# Patient Record
Sex: Female | Born: 1965 | Race: Black or African American | Hispanic: No | Marital: Single | Smoking: Current every day smoker
Health system: Southern US, Community
[De-identification: ages and names within clinical notes are randomized; demographics above are authoritative.]

## PROBLEM LIST (undated history)

## (undated) ENCOUNTER — Ambulatory Visit

## (undated) DIAGNOSIS — Z720 Tobacco use: Secondary | ICD-10-CM

## (undated) DIAGNOSIS — F1991 Other psychoactive substance use, unspecified, in remission: Secondary | ICD-10-CM

## (undated) DIAGNOSIS — J45909 Unspecified asthma, uncomplicated: Secondary | ICD-10-CM

## (undated) DIAGNOSIS — F209 Schizophrenia, unspecified: Secondary | ICD-10-CM

## (undated) DIAGNOSIS — E119 Type 2 diabetes mellitus without complications: Secondary | ICD-10-CM

## (undated) DIAGNOSIS — F32A Depression, unspecified: Secondary | ICD-10-CM

## (undated) DIAGNOSIS — E785 Hyperlipidemia, unspecified: Secondary | ICD-10-CM

## (undated) DIAGNOSIS — D573 Sickle-cell trait: Secondary | ICD-10-CM

## (undated) DIAGNOSIS — F909 Attention-deficit hyperactivity disorder, unspecified type: Secondary | ICD-10-CM

## (undated) DIAGNOSIS — I2510 Atherosclerotic heart disease of native coronary artery without angina pectoris: Secondary | ICD-10-CM

## (undated) DIAGNOSIS — F319 Bipolar disorder, unspecified: Secondary | ICD-10-CM

## (undated) DIAGNOSIS — F329 Major depressive disorder, single episode, unspecified: Secondary | ICD-10-CM

## (undated) DIAGNOSIS — F419 Anxiety disorder, unspecified: Secondary | ICD-10-CM

## (undated) DIAGNOSIS — Z5189 Encounter for other specified aftercare: Secondary | ICD-10-CM

## (undated) DIAGNOSIS — K219 Gastro-esophageal reflux disease without esophagitis: Secondary | ICD-10-CM

## (undated) DIAGNOSIS — I10 Essential (primary) hypertension: Secondary | ICD-10-CM

## (undated) HISTORY — DX: Depression, unspecified: F32.A

## (undated) HISTORY — DX: Attention-deficit hyperactivity disorder, unspecified type: F90.9

## (undated) HISTORY — DX: Tobacco use: Z72.0

## (undated) HISTORY — DX: Encounter for other specified aftercare: Z51.89

## (undated) HISTORY — DX: Gastro-esophageal reflux disease without esophagitis: K21.9

## (undated) HISTORY — DX: Atherosclerotic heart disease of native coronary artery without angina pectoris: I25.10

## (undated) HISTORY — DX: Major depressive disorder, single episode, unspecified: F32.9

## (undated) HISTORY — DX: Anxiety disorder, unspecified: F41.9

## (undated) HISTORY — DX: Schizophrenia, unspecified: F20.9

## (undated) HISTORY — DX: Other psychoactive substance use, unspecified, in remission: F19.91

## (undated) HISTORY — DX: Bipolar disorder, unspecified: F31.9

## (undated) HISTORY — PX: CERVICAL BIOPSY  W/ LOOP ELECTRODE EXCISION: SUR135

---
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
## 1983-11-05 DIAGNOSIS — Z5189 Encounter for other specified aftercare: Secondary | ICD-10-CM

## 1983-11-05 HISTORY — DX: Encounter for other specified aftercare: Z51.89

## 1987-11-05 HISTORY — PX: TUBAL LIGATION: SHX77

## 1998-02-18 ENCOUNTER — Emergency Department (HOSPITAL_COMMUNITY): Admission: EM | Admit: 1998-02-18 | Discharge: 1998-02-18 | Payer: Self-pay | Admitting: Emergency Medicine

## 1999-06-21 ENCOUNTER — Emergency Department (HOSPITAL_COMMUNITY): Admission: EM | Admit: 1999-06-21 | Discharge: 1999-06-21 | Payer: Self-pay | Admitting: Emergency Medicine

## 2001-08-07 ENCOUNTER — Emergency Department (HOSPITAL_COMMUNITY): Admission: EM | Admit: 2001-08-07 | Discharge: 2001-08-07 | Payer: Self-pay | Admitting: Emergency Medicine

## 2001-10-05 ENCOUNTER — Ambulatory Visit (HOSPITAL_COMMUNITY): Admission: RE | Admit: 2001-10-05 | Discharge: 2001-10-05 | Payer: Self-pay | Admitting: Unknown Physician Specialty

## 2002-07-30 ENCOUNTER — Encounter: Admission: RE | Admit: 2002-07-30 | Discharge: 2002-07-30 | Payer: Self-pay | Admitting: Occupational Medicine

## 2002-07-30 ENCOUNTER — Encounter: Payer: Self-pay | Admitting: Occupational Medicine

## 2002-10-27 ENCOUNTER — Emergency Department (HOSPITAL_COMMUNITY): Admission: EM | Admit: 2002-10-27 | Discharge: 2002-10-27 | Payer: Self-pay | Admitting: *Deleted

## 2002-10-27 ENCOUNTER — Encounter: Payer: Self-pay | Admitting: Emergency Medicine

## 2003-09-05 ENCOUNTER — Emergency Department (HOSPITAL_COMMUNITY): Admission: EM | Admit: 2003-09-05 | Discharge: 2003-09-05 | Payer: Self-pay | Admitting: Emergency Medicine

## 2003-09-27 ENCOUNTER — Emergency Department (HOSPITAL_COMMUNITY): Admission: EM | Admit: 2003-09-27 | Discharge: 2003-09-27 | Payer: Self-pay | Admitting: Emergency Medicine

## 2003-10-06 ENCOUNTER — Encounter: Admission: RE | Admit: 2003-10-06 | Discharge: 2003-10-06 | Payer: Self-pay | Admitting: Internal Medicine

## 2004-01-10 ENCOUNTER — Encounter: Admission: RE | Admit: 2004-01-10 | Discharge: 2004-01-10 | Payer: Self-pay | Admitting: Family Medicine

## 2004-02-04 ENCOUNTER — Emergency Department (HOSPITAL_COMMUNITY): Admission: EM | Admit: 2004-02-04 | Discharge: 2004-02-04 | Payer: Self-pay | Admitting: Emergency Medicine

## 2004-02-29 ENCOUNTER — Emergency Department (HOSPITAL_COMMUNITY): Admission: EM | Admit: 2004-02-29 | Discharge: 2004-02-29 | Payer: Self-pay | Admitting: Family Medicine

## 2004-04-29 ENCOUNTER — Emergency Department (HOSPITAL_COMMUNITY): Admission: EM | Admit: 2004-04-29 | Discharge: 2004-04-29 | Payer: Self-pay | Admitting: Unknown Physician Specialty

## 2004-07-16 ENCOUNTER — Emergency Department (HOSPITAL_COMMUNITY): Admission: EM | Admit: 2004-07-16 | Discharge: 2004-07-16 | Payer: Self-pay | Admitting: Emergency Medicine

## 2004-10-10 ENCOUNTER — Ambulatory Visit (HOSPITAL_COMMUNITY): Admission: RE | Admit: 2004-10-10 | Discharge: 2004-10-10 | Payer: Self-pay | Admitting: Family Medicine

## 2005-10-09 ENCOUNTER — Encounter: Admission: RE | Admit: 2005-10-09 | Discharge: 2005-10-09 | Payer: Self-pay | Admitting: Family Medicine

## 2005-10-14 ENCOUNTER — Emergency Department (HOSPITAL_COMMUNITY): Admission: EM | Admit: 2005-10-14 | Discharge: 2005-10-14 | Payer: Self-pay | Admitting: Emergency Medicine

## 2005-10-24 ENCOUNTER — Emergency Department (HOSPITAL_COMMUNITY): Admission: EM | Admit: 2005-10-24 | Discharge: 2005-10-24 | Payer: Self-pay | Admitting: Emergency Medicine

## 2005-11-14 ENCOUNTER — Encounter: Admission: RE | Admit: 2005-11-14 | Discharge: 2005-11-27 | Payer: Self-pay | Admitting: Specialist

## 2006-03-25 ENCOUNTER — Emergency Department (HOSPITAL_COMMUNITY): Admission: EM | Admit: 2006-03-25 | Discharge: 2006-03-25 | Payer: Self-pay | Admitting: Emergency Medicine

## 2006-05-23 ENCOUNTER — Emergency Department (HOSPITAL_COMMUNITY): Admission: EM | Admit: 2006-05-23 | Discharge: 2006-05-23 | Payer: Self-pay | Admitting: Emergency Medicine

## 2006-06-20 ENCOUNTER — Ambulatory Visit: Payer: Self-pay | Admitting: Internal Medicine

## 2006-06-20 ENCOUNTER — Ambulatory Visit: Payer: Self-pay | Admitting: Family Medicine

## 2006-11-01 ENCOUNTER — Emergency Department (HOSPITAL_COMMUNITY): Admission: EM | Admit: 2006-11-01 | Discharge: 2006-11-01 | Payer: Self-pay | Admitting: Emergency Medicine

## 2006-11-06 ENCOUNTER — Ambulatory Visit: Payer: Self-pay | Admitting: *Deleted

## 2006-12-10 ENCOUNTER — Ambulatory Visit: Payer: Self-pay | Admitting: Nurse Practitioner

## 2006-12-31 ENCOUNTER — Emergency Department (HOSPITAL_COMMUNITY): Admission: EM | Admit: 2006-12-31 | Discharge: 2006-12-31 | Payer: Self-pay | Admitting: Emergency Medicine

## 2007-01-01 ENCOUNTER — Ambulatory Visit: Payer: Self-pay | Admitting: Internal Medicine

## 2007-02-16 ENCOUNTER — Ambulatory Visit: Payer: Self-pay | Admitting: Nurse Practitioner

## 2007-07-22 ENCOUNTER — Encounter (INDEPENDENT_AMBULATORY_CARE_PROVIDER_SITE_OTHER): Payer: Self-pay | Admitting: *Deleted

## 2007-09-22 ENCOUNTER — Ambulatory Visit: Payer: Self-pay | Admitting: Internal Medicine

## 2008-02-15 ENCOUNTER — Ambulatory Visit: Payer: Self-pay | Admitting: Internal Medicine

## 2008-02-17 ENCOUNTER — Ambulatory Visit: Payer: Self-pay | Admitting: Internal Medicine

## 2008-02-20 ENCOUNTER — Emergency Department (HOSPITAL_COMMUNITY): Admission: EM | Admit: 2008-02-20 | Discharge: 2008-02-20 | Payer: Self-pay | Admitting: Emergency Medicine

## 2008-05-07 ENCOUNTER — Emergency Department (HOSPITAL_COMMUNITY): Admission: EM | Admit: 2008-05-07 | Discharge: 2008-05-07 | Payer: Self-pay | Admitting: Emergency Medicine

## 2008-07-04 ENCOUNTER — Ambulatory Visit: Payer: Self-pay | Admitting: Internal Medicine

## 2008-08-02 ENCOUNTER — Emergency Department (HOSPITAL_COMMUNITY): Admission: EM | Admit: 2008-08-02 | Discharge: 2008-08-02 | Payer: Self-pay | Admitting: Family Medicine

## 2008-08-29 ENCOUNTER — Encounter (INDEPENDENT_AMBULATORY_CARE_PROVIDER_SITE_OTHER): Payer: Self-pay | Admitting: Internal Medicine

## 2008-08-29 ENCOUNTER — Ambulatory Visit: Payer: Self-pay | Admitting: Internal Medicine

## 2008-08-29 LAB — CONVERTED CEMR LAB
AST: 15 units/L (ref 0–37)
Alkaline Phosphatase: 53 units/L (ref 39–117)
BUN: 9 mg/dL (ref 6–23)
Calcium: 9.4 mg/dL (ref 8.4–10.5)
Chloride: 109 meq/L (ref 96–112)
Creatinine, Ser: 0.62 mg/dL (ref 0.40–1.20)
GC Probe Amp, Genital: NEGATIVE
HDL: 41 mg/dL (ref >39)
Total CHOL/HDL Ratio: 3.4
Valproic Acid Lvl: 93.2 ug/mL (ref 50.0–100.0)

## 2008-08-31 ENCOUNTER — Ambulatory Visit (HOSPITAL_COMMUNITY): Admission: RE | Admit: 2008-08-31 | Discharge: 2008-08-31 | Payer: Self-pay | Admitting: Family Medicine

## 2008-09-12 ENCOUNTER — Encounter: Admission: RE | Admit: 2008-09-12 | Discharge: 2008-09-12 | Payer: Self-pay | Admitting: Family Medicine

## 2008-09-21 ENCOUNTER — Ambulatory Visit: Payer: Self-pay | Admitting: Internal Medicine

## 2008-09-21 LAB — CONVERTED CEMR LAB
Basophils Absolute: 0 10*3/uL (ref 0.0–0.1)
Eosinophils Relative: 3 % (ref 0–5)
HCT: 33.1 % — ABNORMAL LOW (ref 36.0–46.0)
Hemoglobin: 11.1 g/dL — ABNORMAL LOW (ref 12.0–15.0)
Lymphocytes Relative: 40 % (ref 12–46)
Lymphs Abs: 1.9 10*3/uL (ref 0.7–4.0)
Monocytes Absolute: 0.5 10*3/uL (ref 0.1–1.0)
RDW: 14.5 % (ref 11.5–15.5)

## 2008-09-22 ENCOUNTER — Encounter (INDEPENDENT_AMBULATORY_CARE_PROVIDER_SITE_OTHER): Payer: Self-pay | Admitting: Internal Medicine

## 2008-09-23 ENCOUNTER — Ambulatory Visit: Payer: Self-pay | Admitting: Internal Medicine

## 2008-11-10 ENCOUNTER — Emergency Department (HOSPITAL_COMMUNITY): Admission: EM | Admit: 2008-11-10 | Discharge: 2008-11-10 | Payer: Self-pay | Admitting: Emergency Medicine

## 2009-04-24 ENCOUNTER — Ambulatory Visit: Payer: Self-pay | Admitting: Family Medicine

## 2009-06-02 ENCOUNTER — Ambulatory Visit: Payer: Self-pay | Admitting: Internal Medicine

## 2009-06-07 ENCOUNTER — Ambulatory Visit: Payer: Self-pay | Admitting: Internal Medicine

## 2009-06-07 ENCOUNTER — Encounter (INDEPENDENT_AMBULATORY_CARE_PROVIDER_SITE_OTHER): Payer: Self-pay | Admitting: Internal Medicine

## 2009-06-13 ENCOUNTER — Ambulatory Visit: Payer: Self-pay | Admitting: Internal Medicine

## 2009-08-23 ENCOUNTER — Other Ambulatory Visit: Admission: RE | Admit: 2009-08-23 | Discharge: 2009-08-23 | Payer: Self-pay | Admitting: Obstetrics & Gynecology

## 2009-08-23 ENCOUNTER — Ambulatory Visit: Payer: Self-pay | Admitting: Obstetrics & Gynecology

## 2009-09-13 ENCOUNTER — Encounter (INDEPENDENT_AMBULATORY_CARE_PROVIDER_SITE_OTHER): Payer: Self-pay | Admitting: Internal Medicine

## 2009-09-13 ENCOUNTER — Ambulatory Visit: Payer: Self-pay | Admitting: Internal Medicine

## 2009-09-13 LAB — CONVERTED CEMR LAB
BUN: 12 mg/dL (ref 6–23)
CO2: 22 meq/L (ref 19–32)
Calcium: 8.8 mg/dL (ref 8.4–10.5)
Chloride: 107 meq/L (ref 96–112)
Creatinine, Ser: 0.66 mg/dL (ref 0.40–1.20)
Glucose, Bld: 82 mg/dL (ref 70–99)
TSH: 1.257 microintl units/mL (ref 0.350–4.500)

## 2009-09-15 ENCOUNTER — Ambulatory Visit: Payer: Self-pay | Admitting: Internal Medicine

## 2009-10-03 ENCOUNTER — Ambulatory Visit (HOSPITAL_COMMUNITY): Admission: RE | Admit: 2009-10-03 | Discharge: 2009-10-03 | Payer: Self-pay | Admitting: Internal Medicine

## 2009-10-05 ENCOUNTER — Ambulatory Visit: Payer: Self-pay | Admitting: Internal Medicine

## 2009-10-05 ENCOUNTER — Encounter (INDEPENDENT_AMBULATORY_CARE_PROVIDER_SITE_OTHER): Payer: Self-pay | Admitting: Internal Medicine

## 2009-10-05 LAB — CONVERTED CEMR LAB
Basophils Relative: 0 % (ref 0–1)
Lymphs Abs: 2.1 10*3/uL (ref 0.7–4.0)
Monocytes Relative: 9 % (ref 3–12)
Neutro Abs: 2.5 10*3/uL (ref 1.7–7.7)
Neutrophils Relative %: 48 % (ref 43–77)
RBC: 3.81 M/uL — ABNORMAL LOW (ref 3.87–5.11)
WBC: 5.2 10*3/uL (ref 4.0–10.5)

## 2009-12-02 ENCOUNTER — Emergency Department (HOSPITAL_COMMUNITY): Admission: EM | Admit: 2009-12-02 | Discharge: 2009-12-02 | Payer: Self-pay | Admitting: Family Medicine

## 2009-12-30 ENCOUNTER — Emergency Department (HOSPITAL_COMMUNITY): Admission: EM | Admit: 2009-12-30 | Discharge: 2009-12-30 | Payer: Self-pay | Admitting: Emergency Medicine

## 2010-07-09 ENCOUNTER — Emergency Department (HOSPITAL_COMMUNITY): Admission: EM | Admit: 2010-07-09 | Discharge: 2010-07-10 | Payer: Self-pay | Admitting: Emergency Medicine

## 2010-07-18 ENCOUNTER — Emergency Department (HOSPITAL_COMMUNITY): Admission: EM | Admit: 2010-07-18 | Discharge: 2010-07-19 | Payer: Self-pay | Admitting: Emergency Medicine

## 2010-07-19 ENCOUNTER — Inpatient Hospital Stay (HOSPITAL_COMMUNITY): Admission: RE | Admit: 2010-07-19 | Discharge: 2010-07-24 | Payer: Self-pay | Admitting: Psychiatry

## 2010-07-19 ENCOUNTER — Ambulatory Visit: Payer: Self-pay | Admitting: Psychiatry

## 2010-07-20 ENCOUNTER — Emergency Department (HOSPITAL_COMMUNITY): Admission: EM | Admit: 2010-07-20 | Discharge: 2010-07-20 | Payer: Self-pay | Admitting: Emergency Medicine

## 2010-07-21 ENCOUNTER — Ambulatory Visit: Payer: Self-pay | Admitting: Surgery

## 2010-07-21 ENCOUNTER — Encounter (INDEPENDENT_AMBULATORY_CARE_PROVIDER_SITE_OTHER): Payer: Self-pay | Admitting: Psychiatry

## 2010-08-02 ENCOUNTER — Encounter: Admission: RE | Admit: 2010-08-02 | Discharge: 2010-09-14 | Payer: Self-pay | Admitting: Orthopaedic Surgery

## 2010-09-11 ENCOUNTER — Encounter: Admission: RE | Admit: 2010-09-11 | Discharge: 2010-09-11 | Payer: Self-pay | Admitting: Family Medicine

## 2010-10-30 ENCOUNTER — Emergency Department (HOSPITAL_COMMUNITY)
Admission: EM | Admit: 2010-10-30 | Discharge: 2010-10-31 | Payer: Self-pay | Source: Home / Self Care | Admitting: Emergency Medicine

## 2010-11-25 ENCOUNTER — Encounter: Payer: Self-pay | Admitting: Family Medicine

## 2011-01-14 LAB — POCT CARDIAC MARKERS
CKMB, poc: <1 ng/mL — ABNORMAL LOW (ref 1.0–8.0)
Myoglobin, poc: 27.1 ng/mL (ref 12–200)
Myoglobin, poc: 35.7 ng/mL (ref 12–200)
Troponin i, poc: <0.05 ng/mL (ref 0.00–0.09)

## 2011-01-14 LAB — POCT I-STAT, CHEM 8
Calcium, Ion: 1.16 mmol/L (ref 1.12–1.32)
Creatinine, Ser: 0.8 mg/dL (ref 0.4–1.2)
Glucose, Bld: 91 mg/dL (ref 70–99)
Hemoglobin: 11.6 g/dL — ABNORMAL LOW (ref 12.0–15.0)
Sodium: 141 mEq/L (ref 135–145)
TCO2: 25 mmol/L (ref 0–100)

## 2011-01-14 LAB — CBC
HCT: 31.9 % — ABNORMAL LOW (ref 36.0–46.0)
MCH: 28.7 pg (ref 26.0–34.0)
MCV: 83.9 fL (ref 78.0–100.0)
Platelets: 323 10*3/uL (ref 150–400)
RBC: 3.8 MIL/uL — ABNORMAL LOW (ref 3.87–5.11)

## 2011-01-14 LAB — BASIC METABOLIC PANEL
Chloride: 107 mEq/L (ref 96–112)
Creatinine, Ser: 0.71 mg/dL (ref 0.4–1.2)
GFR calc Af Amer: >60 mL/min (ref >60)
GFR calc non Af Amer: >60 mL/min (ref >60)
Potassium: 3.2 mEq/L — ABNORMAL LOW (ref 3.5–5.1)

## 2011-01-14 LAB — DIFFERENTIAL
Eosinophils Absolute: 0.1 10*3/uL (ref 0.0–0.7)
Eosinophils Relative: 2 % (ref 0–5)
Lymphs Abs: 2.2 10*3/uL (ref 0.7–4.0)
Monocytes Absolute: 0.5 10*3/uL (ref 0.1–1.0)

## 2011-01-17 LAB — POCT I-STAT, CHEM 8
BUN: 7 mg/dL (ref 6–23)
Calcium, Ion: 1.13 mmol/L (ref 1.12–1.32)
Chloride: 109 mEq/L (ref 96–112)
Creatinine, Ser: 0.7 mg/dL (ref 0.4–1.2)
Glucose, Bld: 114 mg/dL — ABNORMAL HIGH (ref 70–99)
HCT: 39 % (ref 36.0–46.0)
Hemoglobin: 13.3 g/dL (ref 12.0–15.0)
Potassium: 3 meq/L — ABNORMAL LOW (ref 3.5–5.1)
Sodium: 142 mEq/L (ref 135–145)
TCO2: 21 mmol/L (ref 0–100)

## 2011-01-17 LAB — BASIC METABOLIC PANEL
CO2: 27 mEq/L (ref 19–32)
Chloride: 111 mEq/L (ref 96–112)
GFR calc Af Amer: >60 mL/min (ref >60)
Potassium: 3.1 mEq/L — ABNORMAL LOW (ref 3.5–5.1)

## 2011-01-17 LAB — HEPATIC FUNCTION PANEL
ALT: 16 U/L (ref 0–35)
AST: 19 U/L (ref 0–37)
Total Protein: 6.8 g/dL (ref 6.0–8.3)

## 2011-01-17 LAB — CBC
Platelets: 442 10*3/uL — ABNORMAL HIGH (ref 150–400)
RBC: 3.63 MIL/uL — ABNORMAL LOW (ref 3.87–5.11)
RDW: 14.1 % (ref 11.5–15.5)
WBC: 5.6 10*3/uL (ref 4.0–10.5)

## 2011-01-17 LAB — URINALYSIS, ROUTINE W REFLEX MICROSCOPIC
Glucose, UA: NEGATIVE mg/dL
Ketones, ur: NEGATIVE mg/dL
Leukocytes, UA: NEGATIVE
Nitrite: NEGATIVE
Protein, ur: NEGATIVE mg/dL
pH: 5.5 (ref 5.0–8.0)

## 2011-01-17 LAB — RAPID URINE DRUG SCREEN, HOSP PERFORMED
Amphetamines: NOT DETECTED
Barbiturates: NOT DETECTED
Benzodiazepines: NOT DETECTED
Cocaine: POSITIVE — AB
Opiates: NOT DETECTED

## 2011-01-17 LAB — PREGNANCY, URINE: Preg Test, Ur: NEGATIVE

## 2011-01-17 LAB — URINE MICROSCOPIC-ADD ON

## 2011-01-17 LAB — VALPROIC ACID LEVEL: Valproic Acid Lvl: 62.8 ug/mL (ref 50.0–100.0)

## 2011-01-17 LAB — ETHANOL: Alcohol, Ethyl (B): 5 mg/dL (ref 0–10)

## 2011-02-07 LAB — POCT PREGNANCY, URINE: Preg Test, Ur: NEGATIVE

## 2011-02-07 LAB — GLUCOSE, CAPILLARY: Glucose-Capillary: 97 mg/dL (ref 70–99)

## 2011-07-30 LAB — RAPID STREP SCREEN (MED CTR MEBANE ONLY): Streptococcus, Group A Screen (Direct): NEGATIVE

## 2011-08-01 LAB — CBC
Hemoglobin: 10.2 — ABNORMAL LOW
RBC: 3.59 — ABNORMAL LOW

## 2011-08-01 LAB — BASIC METABOLIC PANEL
Calcium: 8 — ABNORMAL LOW
GFR calc Af Amer: >60
GFR calc non Af Amer: >60
Sodium: 139

## 2011-08-01 LAB — DIFFERENTIAL
Basophils Absolute: 0
Lymphocytes Relative: 13
Monocytes Absolute: 0.8
Monocytes Relative: 14 — ABNORMAL HIGH
Neutro Abs: 4.2
Neutrophils Relative %: 73

## 2011-08-05 LAB — WET PREP, GENITAL
WBC, Wet Prep HPF POC: NONE SEEN
Yeast Wet Prep HPF POC: NONE SEEN

## 2011-08-05 LAB — POCT URINALYSIS DIP (DEVICE)
Bilirubin Urine: NEGATIVE
Glucose, UA: NEGATIVE
Ketones, ur: NEGATIVE
Operator id: 235561

## 2011-08-05 LAB — GC/CHLAMYDIA PROBE AMP, GENITAL: Chlamydia, DNA Probe: NEGATIVE

## 2011-08-05 LAB — POCT PREGNANCY, URINE: Preg Test, Ur: NEGATIVE

## 2011-08-30 ENCOUNTER — Emergency Department (HOSPITAL_COMMUNITY): Payer: Self-pay

## 2011-08-30 ENCOUNTER — Emergency Department (HOSPITAL_COMMUNITY)
Admission: EM | Admit: 2011-08-30 | Discharge: 2011-08-30 | Disposition: A | Discharge status: Home / Self Care | Payer: Self-pay | Attending: Emergency Medicine | Admitting: Emergency Medicine

## 2011-08-30 DIAGNOSIS — F3130 Bipolar disorder, current episode depressed, mild or moderate severity, unspecified: Secondary | ICD-10-CM | POA: Insufficient documentation

## 2011-08-30 DIAGNOSIS — R112 Nausea with vomiting, unspecified: Secondary | ICD-10-CM | POA: Insufficient documentation

## 2011-08-30 DIAGNOSIS — R0602 Shortness of breath: Secondary | ICD-10-CM | POA: Insufficient documentation

## 2011-08-30 DIAGNOSIS — R197 Diarrhea, unspecified: Secondary | ICD-10-CM | POA: Insufficient documentation

## 2011-08-30 DIAGNOSIS — J40 Bronchitis, not specified as acute or chronic: Secondary | ICD-10-CM | POA: Insufficient documentation

## 2011-08-30 DIAGNOSIS — R059 Cough, unspecified: Secondary | ICD-10-CM | POA: Insufficient documentation

## 2011-08-30 DIAGNOSIS — R05 Cough: Secondary | ICD-10-CM | POA: Insufficient documentation

## 2011-08-30 DIAGNOSIS — E785 Hyperlipidemia, unspecified: Secondary | ICD-10-CM | POA: Insufficient documentation

## 2011-08-30 DIAGNOSIS — E7800 Pure hypercholesterolemia, unspecified: Secondary | ICD-10-CM | POA: Insufficient documentation

## 2011-08-30 DIAGNOSIS — Z79899 Other long term (current) drug therapy: Secondary | ICD-10-CM | POA: Insufficient documentation

## 2011-08-30 LAB — DIFFERENTIAL
Basophils Relative: 0 % (ref 0–1)
Lymphocytes Relative: 26 % (ref 12–46)
Lymphs Abs: 1.4 10*3/uL (ref 0.7–4.0)
Monocytes Absolute: 0.5 10*3/uL (ref 0.1–1.0)
Monocytes Relative: 9 % (ref 3–12)
Neutro Abs: 3.6 10*3/uL (ref 1.7–7.7)
Neutrophils Relative %: 64 % (ref 43–77)

## 2011-08-30 LAB — URINALYSIS, ROUTINE W REFLEX MICROSCOPIC
Bilirubin Urine: NEGATIVE
Glucose, UA: NEGATIVE mg/dL
Ketones, ur: 15 mg/dL — AB
Leukocytes, UA: NEGATIVE
Nitrite: NEGATIVE
Protein, ur: NEGATIVE mg/dL
Specific Gravity, Urine: 1.013 (ref 1.005–1.030)
Urobilinogen, UA: 0.2 mg/dL (ref 0.0–1.0)
pH: 5.5 (ref 5.0–8.0)

## 2011-08-30 LAB — CBC
HCT: 33.4 % — ABNORMAL LOW (ref 36.0–46.0)
Hemoglobin: 11.6 g/dL — ABNORMAL LOW (ref 12.0–15.0)
MCH: 28.9 pg (ref 26.0–34.0)
RBC: 4.01 MIL/uL (ref 3.87–5.11)

## 2011-08-30 LAB — URINE MICROSCOPIC-ADD ON

## 2011-11-11 ENCOUNTER — Other Ambulatory Visit: Payer: Self-pay | Admitting: Family Medicine

## 2011-11-11 DIAGNOSIS — Z1231 Encounter for screening mammogram for malignant neoplasm of breast: Secondary | ICD-10-CM

## 2011-11-19 ENCOUNTER — Ambulatory Visit: Payer: Self-pay

## 2012-11-18 ENCOUNTER — Emergency Department (INDEPENDENT_AMBULATORY_CARE_PROVIDER_SITE_OTHER)
Admission: EM | Admit: 2012-11-18 | Discharge: 2012-11-18 | Disposition: A | Discharge status: Home / Self Care | Payer: Self-pay | Source: Home / Self Care

## 2012-11-18 ENCOUNTER — Other Ambulatory Visit (HOSPITAL_COMMUNITY): Payer: Self-pay | Admitting: Family Medicine

## 2012-11-18 ENCOUNTER — Encounter (HOSPITAL_COMMUNITY): Payer: Self-pay

## 2012-11-18 DIAGNOSIS — H109 Unspecified conjunctivitis: Secondary | ICD-10-CM

## 2012-11-18 DIAGNOSIS — J069 Acute upper respiratory infection, unspecified: Secondary | ICD-10-CM

## 2012-11-18 DIAGNOSIS — Z1231 Encounter for screening mammogram for malignant neoplasm of breast: Secondary | ICD-10-CM

## 2012-11-18 DIAGNOSIS — L739 Follicular disorder, unspecified: Secondary | ICD-10-CM

## 2012-11-18 DIAGNOSIS — L738 Other specified follicular disorders: Secondary | ICD-10-CM

## 2012-11-18 HISTORY — DX: Unspecified asthma, uncomplicated: J45.909

## 2012-11-18 MED ORDER — POLYMYXIN B-TRIMETHOPRIM 10000-0.1 UNIT/ML-% OP SOLN: 1.0000 [drp] | OPHTHALMIC | Status: DC

## 2012-11-18 MED ORDER — PHENYLEPHRINE-CHLORPHEN-DM 10-4-12.5 MG/5ML PO LIQD: 5.0000 mL | ORAL | Status: DC | PRN

## 2012-11-18 MED ORDER — CEPHALEXIN 500 MG PO CAPS: 500.0000 mg | ORAL_CAPSULE | Freq: Three times a day (TID) | ORAL | Status: DC

## 2012-11-18 NOTE — ED Notes (Signed)
states she had the flu shot 1-3, and has been sick for past 7 days or so; no relief w OTC mediations

## 2012-11-18 NOTE — ED Provider Notes (Signed)
History     CSN: [id number]  Arrival date & time 11/18/12  1203   None     Chief Complaint  Patient presents with  . Influenza    (Consider location/radiation/quality/duration/timing/severity/associated sxs/prior treatment) HPI Comments: 47 year old female presents with 3-4 days of bodyaches, chills and upper respiratory congestion. Is also complaining of soreness in the outer canthus of the right eye. She is positive for earache on the right and complains of sore throat. Denies shortness of breath or documented fever. And additional concern is the palpation of a couple of very small superficial nodules in the axilla and an elongated tender structure in the right axilla.   Past Medical History  Diagnosis Date  . Asthma     History reviewed. No pertinent past surgical history.  History reviewed. No pertinent family history.  History  Substance Use Topics  . Smoking status: Never Smoker   . Smokeless tobacco: Not on file  . Alcohol Use: No    OB History    Grav Para Term Preterm Abortions TAB SAB Ect Mult Living                  Review of Systems  Constitutional: Negative for fever, chills, activity change, appetite change and fatigue.  HENT: Positive for congestion, rhinorrhea and postnasal drip. Negative for facial swelling, neck pain and neck stiffness.   Eyes: Positive for redness and itching. Negative for photophobia and visual disturbance.  Respiratory: Positive for cough. Negative for shortness of breath and wheezing.   Cardiovascular: Negative.  Negative for chest pain.  Gastrointestinal: Negative.   Genitourinary: Negative.   Musculoskeletal: Negative.   Skin: Negative for pallor and rash.  Neurological: Negative.     Allergies  Sulfa antibiotics  Home Medications   Current Outpatient Rx  Name  Route  Sig  Dispense  Refill  . ALBUTEROL SULFATE HFA 108 (90 BASE) MCG/ACT IN AERS   Inhalation   Inhale 2 puffs into the lungs every 6 (six) hours as  needed.         . ARIPIPRAZOLE 10 MG PO TABS   Oral   Take 10 mg by mouth daily.         Marland Kitchen RISPERIDONE 0.5 MG PO TABS   Oral   Take 0.5 mg by mouth 2 (two) times daily.         Marland Kitchen ROSUVASTATIN CALCIUM 10 MG PO TABS   Oral   Take 10 mg by mouth daily.         . TRIAMCINOLONE ACETONIDE 55 MCG/ACT NA INHA   Nasal   Place 2 sprays into the nose daily.         . CEPHALEXIN 500 MG PO CAPS   Oral   Take 1 capsule (500 mg total) by mouth 3 (three) times daily.   21 capsule   0   . PHENYLEPHRINE-CHLORPHEN-DM 08-08-11.5 MG/5ML PO LIQD   Oral   Take 5 mLs by mouth every 4 (four) hours as needed.   120 mL   0   . POLYMYXIN B-TRIMETHOPRIM 10000-0.1 UNIT/ML-% OP SOLN   Right Eye   Place 1 drop into the right eye every 4 (four) hours.   10 mL   0     BP 114/80  Pulse 87  Temp 98.4 F (36.9 C) (Oral)  Resp 20  SpO2 99%  Physical Exam  Nursing note and vitals reviewed. Constitutional: She is oriented to person, place, and time. She appears well-developed and well-nourished. No  distress.  HENT:       Bilateral TMs are retracted but no erythema. No bulging or effusion. Oropharynx with no erythema or exudates. Positive for clear PND  Eyes:       A small portion of the right lower lid at the outer canthus exhibits mild erythema. No pustule or hordeolum like structures observed. No swelling.  Neck: Normal range of motion. Neck supple.  Cardiovascular: Normal rate and regular rhythm.   Pulmonary/Chest: Effort normal and breath sounds normal. No respiratory distress. She has no wheezes. She has no rales.  Musculoskeletal: Normal range of motion. She exhibits no edema.  Lymphadenopathy:    She has no cervical adenopathy.  Neurological: She is alert and oriented to person, place, and time.  Skin: Skin is warm and dry. No rash noted.       There are 2 small, 1-2 mm size subdermal nodules in the left axilla. In the right axilla there are 2 at the same size and type of nodules  as well as a larger 2 and half centimeter by 3 mm of thickness in the subdermal structures. This may represent a continuance of the folliculitis or early abscess. It is not fluctuant nor is there overlying discoloration. It is mildly tender.  Psychiatric: She has a normal mood and affect.    ED Course  Procedures (including critical care time)  Labs Reviewed - No data to display No results found.   1. URI, acute   2. Folliculitis   3. Conjunctivitis of right eye       MDM  Keflex 500 mg 3 times a day for 7 days The: Shaving the XL a, make sure the blades are clean and wash the area well with soap and water after cleaning. He may also want to apply alcohol under the arms after shaving as well as to the razor blade to using. Polytrim 1 drop every 4 hours to the right eye for 5 days. Norell CS 1 teaspoon every 4 hours when necessary cough and cold symptoms. Is unable to afford the Norell may try Dimetapp cold medicine or NyQuil. Tylenol every 4 hours as needed for discomfort or fever Be sure to drink plenty of fluids and stay well hydrated.         Hayden Rasmussen, NP 11/18/12 1433  Hayden Rasmussen, NP 11/18/12 1435

## 2012-11-18 NOTE — ED Provider Notes (Signed)
Medical screening examination/treatment/procedure(s) were performed by non-physician practitioner and as supervising physician I was immediately available for consultation/collaboration.  Raynald Blend, MD 11/18/12 [phone]

## 2012-11-25 ENCOUNTER — Ambulatory Visit (HOSPITAL_COMMUNITY)
Admission: RE | Admit: 2012-11-25 | Discharge: 2012-11-25 | Disposition: A | Discharge status: Home / Self Care | Payer: Self-pay | Source: Ambulatory Visit | Attending: Family Medicine | Admitting: Family Medicine

## 2012-11-25 DIAGNOSIS — Z1231 Encounter for screening mammogram for malignant neoplasm of breast: Secondary | ICD-10-CM

## 2013-07-21 ENCOUNTER — Emergency Department (HOSPITAL_COMMUNITY): Payer: Self-pay

## 2013-07-21 ENCOUNTER — Emergency Department (HOSPITAL_COMMUNITY)
Admission: EM | Admit: 2013-07-21 | Discharge: 2013-07-21 | Disposition: A | Discharge status: Home / Self Care | Payer: Self-pay | Attending: Emergency Medicine | Admitting: Emergency Medicine

## 2013-07-21 ENCOUNTER — Encounter (HOSPITAL_COMMUNITY): Payer: Self-pay | Admitting: Emergency Medicine

## 2013-07-21 DIAGNOSIS — J069 Acute upper respiratory infection, unspecified: Secondary | ICD-10-CM | POA: Insufficient documentation

## 2013-07-21 DIAGNOSIS — J45909 Unspecified asthma, uncomplicated: Secondary | ICD-10-CM | POA: Insufficient documentation

## 2013-07-21 DIAGNOSIS — Z7982 Long term (current) use of aspirin: Secondary | ICD-10-CM | POA: Insufficient documentation

## 2013-07-21 DIAGNOSIS — Z79899 Other long term (current) drug therapy: Secondary | ICD-10-CM | POA: Insufficient documentation

## 2013-07-21 LAB — URINE MICROSCOPIC-ADD ON

## 2013-07-21 LAB — BASIC METABOLIC PANEL
Chloride: 104 mEq/L (ref 96–112)
GFR calc Af Amer: >90 mL/min (ref >90)
Potassium: 3.4 mEq/L — ABNORMAL LOW (ref 3.5–5.1)
Sodium: 139 mEq/L (ref 135–145)

## 2013-07-21 LAB — CBC WITH DIFFERENTIAL/PLATELET
Basophils Absolute: 0 10*3/uL (ref 0.0–0.1)
Basophils Relative: 0 % (ref 0–1)
MCHC: 33.9 g/dL (ref 30.0–36.0)
Neutro Abs: 4.1 10*3/uL (ref 1.7–7.7)
Neutrophils Relative %: 62 % (ref 43–77)
RDW: 14.5 % (ref 11.5–15.5)
WBC: 6.6 10*3/uL (ref 4.0–10.5)

## 2013-07-21 LAB — URINALYSIS, ROUTINE W REFLEX MICROSCOPIC
Ketones, ur: NEGATIVE mg/dL
Nitrite: NEGATIVE
Specific Gravity, Urine: 1.016 (ref 1.005–1.030)
pH: 6 (ref 5.0–8.0)

## 2013-07-21 MED ORDER — HYDROCODONE-ACETAMINOPHEN 7.5-325 MG/15ML PO SOLN: 15.0000 mL | Freq: Four times a day (QID) | ORAL | Status: DC | PRN

## 2013-07-21 MED ORDER — PROMETHAZINE HCL 25 MG PO TABS: 25.0000 mg | ORAL_TABLET | Freq: Four times a day (QID) | ORAL | Status: DC | PRN

## 2013-07-21 MED ORDER — HYDROCOD POLST-CHLORPHEN POLST 10-8 MG/5ML PO LQCR
5.0000 mL | Freq: Once | ORAL | Status: AC
  Administered 2013-07-21: 5 mL via ORAL
  Filled 2013-07-21: qty 5

## 2013-07-21 NOTE — ED Notes (Addendum)
Has had coughing sweats and fever and her period since jan and now she has onehas had diarrhea and vomiting stopped 2 days ago but her chest has been hurting

## 2013-07-21 NOTE — ED Provider Notes (Signed)
Medical screening examination/treatment/procedure(s) were performed by non-physician practitioner and as supervising physician I was immediately available for consultation/collaboration.   Rolan Bucco, MD 07/21/13 [phone]

## 2013-07-21 NOTE — ED Provider Notes (Signed)
CSN: [id number]     Arrival date & time 07/21/13  1051 History   First MD Initiated Contact with Patient 07/21/13 1121     Chief Complaint  Patient presents with  . URI   (Consider location/radiation/quality/duration/timing/severity/associated sxs/prior Treatment) HPI  Melissa Pham is a 47 y.o. female with past medical history significant for asthma (no hospitalizations or intubations) complaining of multiple symptoms worsening over the course of 2 weeks including chills, rhinorrhea, left-sided otalgia, increasing lymph nodes in the anterior cervical region, pleuritic chest pain, diarrhea, nonbloody, nonbilious, non coffee ground emesis which resolved 3 days ago. Patient denies fever, shortness of breath, melena or hematochezia, change in bladder habits, rash, recent travel, sick contacts. Patient has been giving herself nebulizer treatment at home with good relief. Patient is also perimenopausal, she has not had a menstrual periods since January and she is having bilateral lower abdominal cramping associated with her menstruation which started several days ago.   Past Medical History  Diagnosis Date  . Asthma    History reviewed. No pertinent past surgical history. No family history on file. History  Substance Use Topics  . Smoking status: Never Smoker   . Smokeless tobacco: Not on file  . Alcohol Use: No   OB History   Grav Para Term Preterm Abortions TAB SAB Ect Mult Living                 Review of Systems 10 systems reviewed and found to be negative, except as noted in the HPI  Allergies  Sulfa antibiotics  Home Medications   Current Outpatient Rx  Name  Route  Sig  Dispense  Refill  . albuterol (PROVENTIL HFA;VENTOLIN HFA) 108 (90 BASE) MCG/ACT inhaler   Inhalation   Inhale 2 puffs into the lungs every 6 (six) hours as needed.         . Aspirin-Acetaminophen-Caffeine (PAMPRIN MAX PO)   Oral   Take 2 tablets by mouth daily as needed (cramps).         Marland Kitchen  ibuprofen (ADVIL,MOTRIN) 200 MG tablet   Oral   Take 400 mg by mouth every 6 (six) hours as needed for pain (cold).         . mometasone (NASONEX) 50 MCG/ACT nasal spray   Nasal   Place 2 sprays into the nose daily.         . Multiple Vitamins-Minerals (MULTIVITAMIN WITH MINERALS) tablet   Oral   Take 1 tablet by mouth daily.         . Pseudoeph-Doxylamine-DM-APAP (NYQUIL PO)   Oral   Take 30 mLs by mouth daily as needed (cold).          BP 98/62  Pulse 91  Temp(Src) 98.7 F (37.1 C) (Oral)  Resp 16  Ht 5\' 1"  (1.549 m)  Wt 188 lb (85.276 kg)  BMI 35.54 kg/m2  SpO2 97% Physical Exam  Nursing note and vitals reviewed. Constitutional: She is oriented to person, place, and time. She appears well-developed and well-nourished. No distress.  HENT:  Head: Normocephalic.  Mouth/Throat: Oropharynx is clear and moist. No oropharyngeal exudate.  Bilateral tympanic membranes with normal architecture and good light reflex.  Posterior pharynx is very mildly injected  Eyes: Conjunctivae and EOM are normal. Pupils are equal, round, and reactive to light.  Neck: Neck supple.  Nontender anterior cervical lymphadenopathy  Cardiovascular: Normal rate, regular rhythm and intact distal pulses.  Exam reveals no gallop.   Pulmonary/Chest: Effort normal and breath sounds normal.  No stridor. No respiratory distress. She has no wheezes. She has no rales. She exhibits no tenderness.  Abdominal: Soft. Bowel sounds are normal. She exhibits no distension and no mass. There is no tenderness. There is no rebound and no guarding.  Genitourinary:  No CVA tenderness bilaterally  Musculoskeletal: Normal range of motion. She exhibits no edema and no tenderness.  Lymphadenopathy:    She has cervical adenopathy.  Neurological: She is alert and oriented to person, place, and time.  Skin: Skin is warm.  Psychiatric: She has a normal mood and affect.    ED Course  Procedures (including critical care  time) Labs Review Labs Reviewed  CBC WITH DIFFERENTIAL - Abnormal; Notable for the following:    Hemoglobin 11.3 (*)    HCT 33.3 (*)    All other components within normal limits  BASIC METABOLIC PANEL - Abnormal; Notable for the following:    Potassium 3.4 (*)    Glucose, Bld 101 (*)    All other components within normal limits  URINALYSIS, ROUTINE W REFLEX MICROSCOPIC - Abnormal; Notable for the following:    APPearance CLOUDY (*)    Hgb urine dipstick LARGE (*)    Protein, ur 30 (*)    Leukocytes, UA TRACE (*)    All other components within normal limits  URINE MICROSCOPIC-ADD ON - Abnormal; Notable for the following:    Squamous Epithelial / LPF MANY (*)    Bacteria, UA FEW (*)    All other components within normal limits  URINE CULTURE   Imaging Review Dg Chest 2 View  07/21/2013   CLINICAL DATA:  Cough  EXAM: CHEST  2 VIEW  COMPARISON:  08/30/2011  FINDINGS: The heart size and mediastinal contours are within normal limits. Both lungs are clear. The visualized skeletal structures are unremarkable.  IMPRESSION: No active cardiopulmonary disease.   Electronically Signed   By: Marlan Palau M.D.   On: 07/21/2013 12:44     Date: 07/21/2013  Rate: 64  Rhythm: normal sinus rhythm  QRS Axis: normal  Intervals: normal  ST/T Wave abnormalities: normal  Conduction Disutrbances:none  Narrative Interpretation:   Old EKG Reviewed: Unavailable   MDM   1. URI (upper respiratory infection)     Filed Vitals:   07/21/13 1109 07/21/13 1325 07/21/13 1355  BP: 98/62 126/86 104/72  Pulse: 91 86 74  Temp: 98.7 F (37.1 C)    TempSrc: Oral    Resp: 16 20   Height: 5\' 1"  (1.549 m)    Weight: 188 lb (85.276 kg)    SpO2: 97% 100% 100%     Melissa Pham is a 47 y.o. female with URI like symptoms over the course of the last 2 weeks. Lung sounds are clear to auscultation, patient's vitals are stable and within normal limits. Blood work shows no significant abnormalities. Urinalysis  is highly contaminated difficult to interpret, as patient has no UTI symptoms I think it is reasonable to refrain from treatment and followup culture. Chest x-ray shows no infiltrate. I have reassured the patient that her symptoms are likely viral in nature, have recommended aggressive hydration and rest. We'll give her symptomatic control with Hycet and Phenergan.  Pt is hemodynamically stable, appropriate for, and amenable to discharge at this time. Pt verbalized understanding and agrees with care plan. All questions answered. Outpatient follow-up and specific return precautions discussed.    Discharge Medication List as of 07/21/2013  1:33 PM    START taking these medications   Details  HYDROcodone-acetaminophen (HYCET) 7.5-325 mg/15 ml solution Take 15 mLs by mouth every 6 (six) hours as needed for cough., Starting 07/21/2013, Until Discontinued, Print    promethazine (PHENERGAN) 25 MG tablet Take 1 tablet (25 mg total) by mouth every 6 (six) hours as needed for nausea., Starting 07/21/2013, Until Discontinued, Print        Note: Portions of this report may have been transcribed using voice recognition software. Every effort was made to ensure accuracy; however, inadvertent computerized transcription errors may be present      Wynetta Emery, PA-C 07/21/13 1622

## 2013-07-22 LAB — URINE CULTURE: Colony Count: >=100000

## 2013-07-27 ENCOUNTER — Inpatient Hospital Stay (HOSPITAL_COMMUNITY): Admission: RE | Admit: 2013-07-27 | Payer: Self-pay | Source: Ambulatory Visit

## 2013-08-10 ENCOUNTER — Encounter (HOSPITAL_COMMUNITY): Payer: Self-pay

## 2013-08-10 ENCOUNTER — Ambulatory Visit (HOSPITAL_COMMUNITY)
Admission: RE | Admit: 2013-08-10 | Discharge: 2013-08-10 | Disposition: A | Discharge status: Home / Self Care | Payer: Self-pay | Source: Ambulatory Visit | Attending: Obstetrics and Gynecology | Admitting: Obstetrics and Gynecology

## 2013-08-10 VITALS — BP 118/78 | Temp 97.7°F | Ht 61.0 in | Wt 186.4 lb

## 2013-08-10 DIAGNOSIS — Z01419 Encounter for gynecological examination (general) (routine) without abnormal findings: Secondary | ICD-10-CM

## 2013-08-10 HISTORY — DX: Sickle-cell trait: D57.3

## 2013-08-10 NOTE — Progress Notes (Signed)
No complaints today.  Pap Smear:    Pap smear completed today. Patients last Pap smear was 09/13/2009 and normal. Per patient has a history of an abnormal Pap smear around 2009 that required a LEEP for follow up. Last Pap smear result is in EPIC.   Physical exam: Breasts Breasts symmetrical. No skin abnormalities bilateral breasts. No nipple retraction bilateral breasts. No nipple discharge bilateral breasts. No lymphadenopathy. No lumps palpated bilateral breasts. Patient complained of bilateral breast tenderness on exam.         Pelvic/Bimanual   Ext Genitalia No lesions, no swelling and no discharge observed on external genitalia.         Vagina Vagina pink and normal texture. No lesions or discharge observed in vagina.          Cervix Cervix is present. Cervix pink and of normal texture. No discharge observed.     Uterus Uterus is present and palpable. Uterus in normal position and normal size.        Adnexae Bilateral ovaries present and palpable. No tenderness on palpation.          Rectovaginal No rectal exam completed today since patient had no rectal complaints. No skin abnormalities observed on exam.

## 2013-08-10 NOTE — Patient Instructions (Signed)
Taught Melissa Pham how to perform BSE and gave educational materials to take home. Let her know [facility] will cover Pap smears every 3 years unless has a history of abnormal Pap smears. Let patient know will follow up with her within the next couple weeks with results by phone. Smoking Cessation discussed with patient. Will call patient with appointment for GYN referral for AUB. Let her know her mammogram is due January 2015.  Melissa Pham verbalized understanding.  Daton Szilagyi, Kathaleen Maser, RN 10:05 AM

## 2013-08-16 ENCOUNTER — Telehealth (HOSPITAL_COMMUNITY): Payer: Self-pay | Admitting: *Deleted

## 2013-08-16 NOTE — Telephone Encounter (Signed)
Telephoned patient at home # and left message to return call to [facility] 

## 2013-08-24 ENCOUNTER — Telehealth (HOSPITAL_COMMUNITY): Payer: Self-pay | Admitting: *Deleted

## 2013-08-24 NOTE — Telephone Encounter (Signed)
Telephoned patient at home # and left message to return call to [facility] 

## 2013-08-25 ENCOUNTER — Telehealth (HOSPITAL_COMMUNITY): Payer: Self-pay | Admitting: *Deleted

## 2013-08-25 NOTE — Telephone Encounter (Signed)
Patient returned called. Advised patient of negative pap smear. Next pap smear due in 3 years. Patient voiced understanding.

## 2013-11-08 ENCOUNTER — Other Ambulatory Visit (HOSPITAL_COMMUNITY): Payer: Self-pay | Admitting: Family Medicine

## 2013-11-08 DIAGNOSIS — Z1231 Encounter for screening mammogram for malignant neoplasm of breast: Secondary | ICD-10-CM

## 2013-11-29 ENCOUNTER — Ambulatory Visit (HOSPITAL_COMMUNITY)
Admission: RE | Admit: 2013-11-29 | Discharge: 2013-11-29 | Disposition: A | Discharge status: Home / Self Care | Payer: Self-pay | Source: Ambulatory Visit | Attending: Family Medicine | Admitting: Family Medicine

## 2013-11-29 DIAGNOSIS — Z1231 Encounter for screening mammogram for malignant neoplasm of breast: Secondary | ICD-10-CM

## 2014-07-13 ENCOUNTER — Encounter: Payer: Self-pay | Admitting: *Deleted

## 2014-08-17 ENCOUNTER — Encounter: Payer: Self-pay | Admitting: Obstetrics & Gynecology

## 2014-08-31 ENCOUNTER — Encounter: Payer: Self-pay | Admitting: Obstetrics & Gynecology

## 2014-09-05 ENCOUNTER — Encounter (HOSPITAL_COMMUNITY): Payer: Self-pay

## 2015-02-20 ENCOUNTER — Emergency Department (HOSPITAL_COMMUNITY): Payer: Medicaid Other

## 2015-02-20 ENCOUNTER — Observation Stay (HOSPITAL_COMMUNITY)
Admission: EM | Admit: 2015-02-20 | Discharge: 2015-02-21 | Disposition: A | Discharge status: Home / Self Care | Payer: Medicaid Other | Attending: Internal Medicine | Admitting: Internal Medicine

## 2015-02-20 ENCOUNTER — Encounter (HOSPITAL_COMMUNITY): Payer: Self-pay | Admitting: Emergency Medicine

## 2015-02-20 DIAGNOSIS — Z862 Personal history of diseases of the blood and blood-forming organs and certain disorders involving the immune mechanism: Secondary | ICD-10-CM | POA: Diagnosis not present

## 2015-02-20 DIAGNOSIS — R1013 Epigastric pain: Secondary | ICD-10-CM | POA: Diagnosis not present

## 2015-02-20 DIAGNOSIS — R079 Chest pain, unspecified: Secondary | ICD-10-CM | POA: Diagnosis not present

## 2015-02-20 DIAGNOSIS — R1030 Lower abdominal pain, unspecified: Secondary | ICD-10-CM

## 2015-02-20 DIAGNOSIS — R197 Diarrhea, unspecified: Secondary | ICD-10-CM | POA: Diagnosis not present

## 2015-02-20 DIAGNOSIS — Z79899 Other long term (current) drug therapy: Secondary | ICD-10-CM | POA: Insufficient documentation

## 2015-02-20 DIAGNOSIS — E785 Hyperlipidemia, unspecified: Secondary | ICD-10-CM

## 2015-02-20 DIAGNOSIS — Z7982 Long term (current) use of aspirin: Secondary | ICD-10-CM | POA: Diagnosis not present

## 2015-02-20 DIAGNOSIS — R0789 Other chest pain: Secondary | ICD-10-CM

## 2015-02-20 DIAGNOSIS — J45901 Unspecified asthma with (acute) exacerbation: Secondary | ICD-10-CM | POA: Insufficient documentation

## 2015-02-20 DIAGNOSIS — Z720 Tobacco use: Secondary | ICD-10-CM | POA: Insufficient documentation

## 2015-02-20 DIAGNOSIS — R102 Pelvic and perineal pain: Secondary | ICD-10-CM | POA: Diagnosis present

## 2015-02-20 DIAGNOSIS — E119 Type 2 diabetes mellitus without complications: Secondary | ICD-10-CM

## 2015-02-20 DIAGNOSIS — F201 Disorganized schizophrenia: Secondary | ICD-10-CM

## 2015-02-20 DIAGNOSIS — F319 Bipolar disorder, unspecified: Secondary | ICD-10-CM | POA: Diagnosis present

## 2015-02-20 DIAGNOSIS — Z7951 Long term (current) use of inhaled steroids: Secondary | ICD-10-CM | POA: Diagnosis not present

## 2015-02-20 DIAGNOSIS — I10 Essential (primary) hypertension: Secondary | ICD-10-CM | POA: Diagnosis not present

## 2015-02-20 DIAGNOSIS — F209 Schizophrenia, unspecified: Secondary | ICD-10-CM | POA: Diagnosis present

## 2015-02-20 DIAGNOSIS — E876 Hypokalemia: Secondary | ICD-10-CM | POA: Diagnosis present

## 2015-02-20 DIAGNOSIS — I152 Hypertension secondary to endocrine disorders: Secondary | ICD-10-CM

## 2015-02-20 HISTORY — DX: Essential (primary) hypertension: I10

## 2015-02-20 HISTORY — DX: Hyperlipidemia, unspecified: E78.5

## 2015-02-20 HISTORY — DX: Type 2 diabetes mellitus without complications: E11.9

## 2015-02-20 LAB — I-STAT CHEM 8, ED
BUN: 15 mg/dL (ref 6–23)
CALCIUM ION: 1.24 mmol/L — AB (ref 1.12–1.23)
CHLORIDE: 106 mmol/L (ref 96–112)
Creatinine, Ser: 0.8 mg/dL (ref 0.50–1.10)
Glucose, Bld: 109 mg/dL — ABNORMAL HIGH (ref 70–99)
HEMATOCRIT: 40 % (ref 36.0–46.0)
Hemoglobin: 13.6 g/dL (ref 12.0–15.0)
Potassium: 3.4 mmol/L — ABNORMAL LOW (ref 3.5–5.1)
Sodium: 143 mmol/L (ref 135–145)
TCO2: 23 mmol/L (ref 0–100)

## 2015-02-20 LAB — URINALYSIS, ROUTINE W REFLEX MICROSCOPIC
Bilirubin Urine: NEGATIVE
GLUCOSE, UA: NEGATIVE mg/dL
Ketones, ur: NEGATIVE mg/dL
Leukocytes, UA: NEGATIVE
Nitrite: NEGATIVE
PROTEIN: NEGATIVE mg/dL
SPECIFIC GRAVITY, URINE: 1.019 (ref 1.005–1.030)
Urobilinogen, UA: 0.2 mg/dL (ref 0.0–1.0)
pH: 5 (ref 5.0–8.0)

## 2015-02-20 LAB — CBC WITH DIFFERENTIAL/PLATELET
BASOS ABS: 0 10*3/uL (ref 0.0–0.1)
BASOS PCT: 0 % (ref 0–1)
Eosinophils Absolute: 0.1 10*3/uL (ref 0.0–0.7)
Eosinophils Relative: 2 % (ref 0–5)
HEMATOCRIT: 35.7 % — AB (ref 36.0–46.0)
HEMOGLOBIN: 11.9 g/dL — AB (ref 12.0–15.0)
LYMPHS ABS: 1.6 10*3/uL (ref 0.7–4.0)
LYMPHS PCT: 31 % (ref 12–46)
MCH: 27.9 pg (ref 26.0–34.0)
MCHC: 33.3 g/dL (ref 30.0–36.0)
MCV: 83.8 fL (ref 78.0–100.0)
Monocytes Absolute: 0.5 10*3/uL (ref 0.1–1.0)
Monocytes Relative: 9 % (ref 3–12)
Neutro Abs: 3 10*3/uL (ref 1.7–7.7)
Neutrophils Relative %: 58 % (ref 43–77)
PLATELETS: 277 10*3/uL (ref 150–400)
RBC: 4.26 MIL/uL (ref 3.87–5.11)
RDW: 15.1 % (ref 11.5–15.5)
WBC: 5.2 10*3/uL (ref 4.0–10.5)

## 2015-02-20 LAB — HEPATIC FUNCTION PANEL
ALK PHOS: 62 U/L (ref 39–117)
ALT: 20 U/L (ref 0–35)
AST: 19 U/L (ref 0–37)
Albumin: 3.6 g/dL (ref 3.5–5.2)
BILIRUBIN TOTAL: 0.4 mg/dL (ref 0.3–1.2)
Total Protein: 6.3 g/dL (ref 6.0–8.3)

## 2015-02-20 LAB — I-STAT TROPONIN, ED: TROPONIN I, POC: 0 ng/mL (ref 0.00–0.08)

## 2015-02-20 LAB — LIPASE, BLOOD: LIPASE: 22 U/L (ref 11–59)

## 2015-02-20 LAB — URINE MICROSCOPIC-ADD ON

## 2015-02-20 LAB — BASIC METABOLIC PANEL
Anion gap: 10 (ref 5–15)
BUN: 13 mg/dL (ref 6–23)
CALCIUM: 9.1 mg/dL (ref 8.4–10.5)
CO2: 24 mmol/L (ref 19–32)
CREATININE: 0.81 mg/dL (ref 0.50–1.10)
Chloride: 108 mmol/L (ref 96–112)
GFR calc Af Amer: >90 mL/min (ref >90)
GFR calc non Af Amer: 83 mL/min — ABNORMAL LOW (ref >90)
GLUCOSE: 111 mg/dL — AB (ref 70–99)
Potassium: 3.5 mmol/L (ref 3.5–5.1)
Sodium: 142 mmol/L (ref 135–145)

## 2015-02-20 LAB — GLUCOSE, CAPILLARY
GLUCOSE-CAPILLARY: 117 mg/dL — AB (ref 70–99)
Glucose-Capillary: 110 mg/dL — ABNORMAL HIGH (ref 70–99)

## 2015-02-20 LAB — TROPONIN I
Troponin I: <0.03 ng/mL (ref <0.031)
Troponin I: <0.03 ng/mL

## 2015-02-20 MED ORDER — SODIUM CHLORIDE 0.9 % IV SOLN
INTRAVENOUS | Status: DC
  Administered 2015-02-20 (×2): via INTRAVENOUS

## 2015-02-20 MED ORDER — ASPIRIN EC 325 MG PO TBEC
325.0000 mg | DELAYED_RELEASE_TABLET | Freq: Every day | ORAL | Status: DC
  Administered 2015-02-21: 325 mg via ORAL
  Filled 2015-02-20 (×2): qty 1

## 2015-02-20 MED ORDER — NITROGLYCERIN 0.4 MG SL SUBL
0.4000 mg | SUBLINGUAL_TABLET | SUBLINGUAL | Status: DC | PRN
  Filled 2015-02-20: qty 1

## 2015-02-20 MED ORDER — MORPHINE SULFATE 2 MG/ML IJ SOLN
2.0000 mg | INTRAMUSCULAR | Status: DC | PRN
  Administered 2015-02-21: 2 mg via INTRAVENOUS
  Filled 2015-02-20: qty 1

## 2015-02-20 MED ORDER — SIMVASTATIN 40 MG PO TABS
40.0000 mg | ORAL_TABLET | Freq: Every day | ORAL | Status: DC
  Administered 2015-02-21: 40 mg via ORAL
  Filled 2015-02-20 (×3): qty 1

## 2015-02-20 MED ORDER — ALBUTEROL SULFATE HFA 108 (90 BASE) MCG/ACT IN AERS: 2.0000 | INHALATION_SPRAY | Freq: Four times a day (QID) | RESPIRATORY_TRACT | Status: DC | PRN

## 2015-02-20 MED ORDER — INSULIN ASPART 100 UNIT/ML ~~LOC~~ SOLN: 0.0000 [IU] | Freq: Every day | SUBCUTANEOUS | Status: DC

## 2015-02-20 MED ORDER — PANTOPRAZOLE SODIUM 40 MG PO TBEC
40.0000 mg | DELAYED_RELEASE_TABLET | Freq: Every day | ORAL | Status: DC
  Administered 2015-02-20 – 2015-02-21 (×3): 40 mg via ORAL
  Filled 2015-02-20 (×3): qty 1

## 2015-02-20 MED ORDER — ALBUTEROL SULFATE (2.5 MG/3ML) 0.083% IN NEBU: 2.5000 mg | INHALATION_SOLUTION | Freq: Four times a day (QID) | RESPIRATORY_TRACT | Status: DC | PRN

## 2015-02-20 MED ORDER — MORPHINE SULFATE 4 MG/ML IJ SOLN
6.0000 mg | Freq: Once | INTRAMUSCULAR | Status: AC
Start: 2015-02-20 — End: 2015-02-20
  Administered 2015-02-20: 6 mg via INTRAVENOUS
  Filled 2015-02-20: qty 2

## 2015-02-20 MED ORDER — HEPARIN SODIUM (PORCINE) 5000 UNIT/ML IJ SOLN
5000.0000 [IU] | Freq: Three times a day (TID) | INTRAMUSCULAR | Status: DC
  Administered 2015-02-20 – 2015-02-21 (×3): 5000 [IU] via SUBCUTANEOUS
  Filled 2015-02-20 (×3): qty 1

## 2015-02-20 MED ORDER — OMEGA-3-ACID ETHYL ESTERS 1 G PO CAPS
1.0000 g | ORAL_CAPSULE | Freq: Every day | ORAL | Status: DC
  Administered 2015-02-20 – 2015-02-21 (×2): 1 g via ORAL
  Filled 2015-02-20 (×3): qty 1

## 2015-02-20 MED ORDER — MULTI-VITAMIN/MINERALS PO TABS
1.0000 | ORAL_TABLET | Freq: Every day | ORAL | Status: DC
  Administered 2015-02-21: 1 via ORAL
  Filled 2015-02-20 (×3): qty 1

## 2015-02-20 MED ORDER — METOPROLOL TARTRATE 25 MG PO TABS
25.0000 mg | ORAL_TABLET | Freq: Two times a day (BID) | ORAL | Status: DC
  Administered 2015-02-21: 25 mg via ORAL
  Filled 2015-02-20 (×2): qty 1

## 2015-02-20 MED ORDER — ONDANSETRON HCL 4 MG/2ML IJ SOLN
4.0000 mg | Freq: Four times a day (QID) | INTRAMUSCULAR | Status: DC | PRN
  Administered 2015-02-21: 4 mg via INTRAVENOUS
  Filled 2015-02-20: qty 2

## 2015-02-20 MED ORDER — TRAZODONE HCL 100 MG PO TABS
200.0000 mg | ORAL_TABLET | Freq: Every day | ORAL | Status: DC
  Administered 2015-02-20: 200 mg via ORAL
  Filled 2015-02-20: qty 2

## 2015-02-20 MED ORDER — INSULIN ASPART 100 UNIT/ML ~~LOC~~ SOLN: 0.0000 [IU] | Freq: Three times a day (TID) | SUBCUTANEOUS | Status: DC

## 2015-02-20 MED ORDER — DICYCLOMINE HCL 10 MG PO CAPS
10.0000 mg | ORAL_CAPSULE | Freq: Once | ORAL | Status: AC
  Administered 2015-02-20: 10 mg via ORAL
  Filled 2015-02-20: qty 1

## 2015-02-20 MED ORDER — POTASSIUM CHLORIDE CRYS ER 20 MEQ PO TBCR
40.0000 meq | EXTENDED_RELEASE_TABLET | Freq: Once | ORAL | Status: AC
Start: 2015-02-20 — End: 2015-02-20
  Administered 2015-02-20: 40 meq via ORAL
  Filled 2015-02-20: qty 2

## 2015-02-20 MED ORDER — LISINOPRIL 5 MG PO TABS
5.0000 mg | ORAL_TABLET | Freq: Every day | ORAL | Status: DC
  Administered 2015-02-20 – 2015-02-21 (×2): 5 mg via ORAL
  Filled 2015-02-20 (×2): qty 1

## 2015-02-20 MED ORDER — VALACYCLOVIR HCL 500 MG PO TABS
1000.0000 mg | ORAL_TABLET | Freq: Every day | ORAL | Status: DC
  Administered 2015-02-20 – 2015-02-21 (×2): 1000 mg via ORAL
  Filled 2015-02-20 (×3): qty 2

## 2015-02-20 MED ORDER — RISPERIDONE 2 MG PO TABS
2.0000 mg | ORAL_TABLET | Freq: Two times a day (BID) | ORAL | Status: DC
  Administered 2015-02-20 – 2015-02-21 (×2): 2 mg via ORAL
  Filled 2015-02-20 (×4): qty 1

## 2015-02-20 MED ORDER — ACETAMINOPHEN 325 MG PO TABS: 650.0000 mg | ORAL_TABLET | ORAL | Status: DC | PRN

## 2015-02-20 MED ORDER — SIMETHICONE 80 MG PO CHEW
80.0000 mg | CHEWABLE_TABLET | Freq: Once | ORAL | Status: AC
  Administered 2015-02-20: 80 mg via ORAL
  Filled 2015-02-20 (×2): qty 1

## 2015-02-20 MED ORDER — GI COCKTAIL ~~LOC~~
30.0000 mL | Freq: Four times a day (QID) | ORAL | Status: DC | PRN
  Administered 2015-02-20 – 2015-02-21 (×4): 30 mL via ORAL
  Filled 2015-02-20 (×5): qty 30

## 2015-02-20 NOTE — ED Notes (Signed)
Ordered heart healthy meal tray for pt. 

## 2015-02-20 NOTE — ED Provider Notes (Signed)
CSN: [id number]     Arrival date & time 02/20/15  1008 History   First MD Initiated Contact with Patient 02/20/15 1010     Chief Complaint  Patient presents with  . Chest Pain     (Consider location/radiation/quality/duration/timing/severity/associated sxs/prior Treatment) HPI Comments: 32 rolled female with asthma, high blood pressure, diabetes, lipids will cut this morning with severe chest pressure and diaphoresis has been fairly constant since with mild improvement. Patient has had recent issues with [facility] however this is more significant and she's never had the chest pressure with it. Patient also has a history of gas problems/bloating however this is also more significant. No guarding history known, no recent cardiac workup. Symptoms mild currently. Patient has mild urinary frequency. Nothing specifically worsened or improved her symptoms. No recent exertional symptoms. No recent surgery, no unilateral leg symptoms, no active cancer, no blood clot history.  Patient is a 49 y.o. female presenting with chest pain. The history is provided by the patient.  Chest Pain Associated symptoms: shortness of breath   Associated symptoms: no abdominal pain, no back pain, no cough, no fever, no headache and not vomiting     Past Medical History  Diagnosis Date  . Asthma   . Sickle cell trait   . Hypertension   . Diabetes mellitus without complication   . Hyperlipidemia    Past Surgical History  Procedure Laterality Date  . Cervical biopsy  w/ loop electrode excision    . Tubal ligation    . Left eye surgery     Family History  Problem Relation Age of Onset  . Hyperlipidemia Mother   . Arthritis Mother   . Heart attack Father   . Diabetes Father   . Hypertension Father   . Breast cancer Sister   . Cancer Sister     cervical  . Breast cancer Paternal Aunt    History  Substance Use Topics  . Smoking status: Current Every Day Smoker -- 0.25 packs/day for 4 years    Types:  Cigarettes  . Smokeless tobacco: Never Used  . Alcohol Use: No   OB History    Gravida Para Term Preterm AB TAB SAB Ectopic Multiple Living   7    3 2 1   4      Review of Systems  Constitutional: Negative for fever and chills.  HENT: Negative for congestion.   Eyes: Negative for visual disturbance.  Respiratory: Positive for shortness of breath. Negative for cough.   Cardiovascular: Positive for chest pain.  Gastrointestinal: Negative for vomiting and abdominal pain.  Genitourinary: Positive for frequency. Negative for dysuria and flank pain.  Musculoskeletal: Negative for back pain, neck pain and neck stiffness.  Skin: Negative for rash.  Neurological: Negative for light-headedness and headaches.      Allergies  Sulfa antibiotics  Home Medications   Prior to Admission medications   Medication Sig Start Date End Date Taking? Authorizing Provider  albuterol (PROVENTIL HFA;VENTOLIN HFA) 108 (90 BASE) MCG/ACT inhaler Inhale 2 puffs into the lungs every 6 (six) hours as needed.   Yes Historical Provider, MD  albuterol (PROVENTIL) (2.5 MG/3ML) 0.083% nebulizer solution Take 2.5 mg by nebulization every 6 (six) hours as needed for wheezing or shortness of breath.   Yes Historical Provider, MD  aspirin EC 81 MG tablet Take 81 mg by mouth daily.   Yes Historical Provider, MD  ibuprofen (ADVIL,MOTRIN) 200 MG tablet Take 400 mg by mouth every 6 (six) hours as needed for  pain (cold).   Yes Historical Provider, MD  lisinopril (PRINIVIL,ZESTRIL) 5 MG tablet Take 5 mg by mouth daily.   Yes Historical Provider, MD  metFORMIN (GLUCOPHAGE) 500 MG tablet Take 250 mg by mouth at bedtime and may repeat dose one time if needed.   Yes Historical Provider, MD  mometasone (NASONEX) 50 MCG/ACT nasal spray Place 2 sprays into the nose daily.   Yes Historical Provider, MD  Multiple Vitamins-Minerals (MULTIVITAMIN WITH MINERALS) tablet Take 1 tablet by mouth daily.   Yes Historical Provider, MD   nitroGLYCERIN (NITROSTAT) 0.4 MG SL tablet Place 0.4 mg under the tongue every 5 (five) minutes as needed for chest pain.   Yes Historical Provider, MD  Omega-3 Fatty Acids (FISH OIL PO) Take 1 tablet by mouth daily.   Yes Historical Provider, MD  omeprazole (PRILOSEC) 20 MG capsule Take 20 mg by mouth daily.   Yes Historical Provider, MD  risperiDONE (RISPERDAL) 2 MG tablet Take 2 mg by mouth 2 (two) times daily.   Yes Historical Provider, MD  simethicone (MYLICON) 80 MG chewable tablet Chew 80 mg by mouth once.   Yes Historical Provider, MD  simvastatin (ZOCOR) 40 MG tablet Take 40 mg by mouth daily.   Yes Historical Provider, MD  traZODone (DESYREL) 100 MG tablet Take 200 mg by mouth at bedtime.   Yes Historical Provider, MD  valACYclovir (VALTREX) 500 MG tablet Take 1,000 mg by mouth daily.    Yes Historical Provider, MD  HYDROcodone-acetaminophen (HYCET) 7.5-325 mg/15 ml solution Take 15 mLs by mouth every 6 (six) hours as needed for cough. Patient not taking: Reported on 02/20/2015 07/21/13   Elmyra Ricks Pisciotta, PA-C  promethazine (PHENERGAN) 25 MG tablet Take 1 tablet (25 mg total) by mouth every 6 (six) hours as needed for nausea. Patient not taking: Reported on 02/20/2015 07/21/13   Elmyra Ricks Pisciotta, PA-C   BP 119/14 mmHg  Pulse 74  Temp(Src) 97.9 F (36.6 C) (Oral)  Resp 19  Ht 5\' 1"  (1.549 m)  Wt 193 lb 14.4 oz (87.952 kg)  BMI 36.66 kg/m2  SpO2 98% Physical Exam  Constitutional: She is oriented to person, place, and time. She appears well-developed and well-nourished.  HENT:  Head: Normocephalic and atraumatic.  Eyes: Conjunctivae are normal. Right eye exhibits no discharge. Left eye exhibits no discharge.  Neck: Normal range of motion. Neck supple. No tracheal deviation present.  Cardiovascular: Normal rate, regular rhythm and intact distal pulses.   Pulmonary/Chest: Effort normal and breath sounds normal.  Abdominal: Soft. She exhibits no distension. There is tenderness (mild  epi, no hx of gb issues). There is no guarding.  Musculoskeletal: She exhibits no edema or tenderness.  Neurological: She is alert and oriented to person, place, and time.  Skin: Skin is warm. No rash noted.  Psychiatric: She has a normal mood and affect.  Nursing note and vitals reviewed.   ED Course  Procedures (including critical care time) Labs Review Labs Reviewed  BASIC METABOLIC PANEL - Abnormal; Notable for the following:    Glucose, Bld 111 (*)    GFR calc non Af Amer 83 (*)    All other components within normal limits  CBC WITH DIFFERENTIAL/PLATELET - Abnormal; Notable for the following:    Hemoglobin 11.9 (*)    HCT 35.7 (*)    All other components within normal limits  I-STAT CHEM 8, ED - Abnormal; Notable for the following:    Potassium 3.4 (*)    Glucose, Bld 109 (*)  Calcium, Ion 1.24 (*)    All other components within normal limits  TROPONIN I  LIPASE, BLOOD  HEPATIC FUNCTION PANEL  URINALYSIS, ROUTINE W REFLEX MICROSCOPIC  I-STAT TROPOININ, ED    Imaging Review Dg Chest 2 View  02/20/2015   CLINICAL DATA:  Chest tightness.  EXAM: CHEST  2 VIEW  COMPARISON:  None.  FINDINGS: Mediastinum hilar structures normal. Lungs are clear. Heart size normal. No pleural effusion or pneumothorax. No acute bony abnormality.  IMPRESSION: No acute cardiopulmonary disease.   Electronically Signed   By: Marcello Moores  Register   On: 02/20/2015 10:39     EKG Interpretation   Date/Time:  Monday February 20 2015 10:17:25 EDT Ventricular Rate:  60 PR Interval:  147 QRS Duration: 60 QT Interval:  445 QTC Calculation: 445 R Axis:   72 Text Interpretation:  Sinus rhythm Low voltage, precordial leads  Nonspecific T abnormalities, anterior leads Baseline wander in lead(s) II  III aVF Confirmed by Xee Hollman  MD, Arren Laminack ([id number]) on 02/20/2015 10:24:14 AM      MDM   Final diagnoses:  Chest pain   Patient with multiple cardiac risk factors presents with concerning story for possible  cardiac. Patient does have other symptoms however from the emergency standpoint she needs further workup of cardiac etiology. No classic blood clot risk factors. Mild urinary symptoms, urinalysis pending. Discussed with [facility] hospitalist will see the patient in ER. Very mild T-wave changes compared to last EKG reviewed. The patients results and plan were reviewed and discussed.   Any x-rays performed were personally reviewed by myself.   Differential diagnosis were considered with the presenting HPI.  Medications  morphine 4 MG/ML injection 6 mg (not administered)  dicyclomine (BENTYL) capsule 10 mg (not administered)    Filed Vitals:   02/20/15 1017 02/20/15 1020 02/20/15 1026 02/20/15 1130  BP:  140/66  119/14  Pulse:    74  Temp: 97.9 F (36.6 C)     TempSrc: Oral     Resp: 23   19  Height:      Weight:   193 lb 14.4 oz (87.952 kg)   SpO2: 100%   98%    Final diagnoses:  Chest pain    Admission/ observation were discussed with the admitting physician, patient and/or family and they are comfortable with the plan.     Elnora Morrison, MD 02/20/15 [phone]

## 2015-02-20 NOTE — ED Notes (Signed)
Taking pt up  To 3W for bedside report

## 2015-02-20 NOTE — ED Notes (Signed)
Pt woke up at 0300 with chest tightness. 9/10 tightness/pain. Pt went to MD office for the tightness, MD sent to ED. Pt has hx of asthma, some SOB. Pt had 2 nitro, pain now 3/10. Pt received 4mg  zofran for nausea. 324 mg ASA also given. Pt thinks she just has gas. Pt also took gas ex. BP 114/72, EKG SR

## 2015-02-20 NOTE — H&P (Signed)
[facility] History and Physical                                                                                    Melissa Pham, is a 49 y.o. female  MRN: [id number]   DOB - [birth year]  Admit Date - 02/20/2015  Outpatient Primary MD for the patient is [place]  With History of -  Past Medical History  Diagnosis Date  . Asthma   . Sickle cell trait   . Hypertension   . Diabetes mellitus without complication   . Hyperlipidemia       Past Surgical History  Procedure Laterality Date  . Cervical biopsy  w/ loop electrode excision    . Tubal ligation    . Left eye surgery      in for   Chief Complaint  Patient presents with  . Chest Pain     HPI 49 year old female patient of history of asthma, hypertension, diabetes, dyslipidemia, sickle cell trait, bipolar disorder and schizophrenia. Presented to the ER with complaints of nocturnal chest pain. Patient reports that she was awakened around 3 AM today when she developed midsternal chest discomfort that was tight and felt like an elephant sitting on her chest. This pain radiated into the axilla down into her left arm causing some numbness. Patient reports that she does have known peripheral neuropathy and involving her arms but this was different. Patient reports that she passed flatus without any improvement in her symptoms. She was noting nausea and diaphoresis associated with this discomfort. She became anxious and later became short of breath. She has not noticed similar symptoms with exertion although according to her own report she does not typically exert herself noting she does not climb stairs. She does report that she plays around with her grandchildren in the kitchen running a little bit has not experienced similar pain. She is also reporting about 4 days worth of diarrhea. She reports about 3 weeks ago she was given antibiotics for enlarged cervical lymph nodes and presumed  respiratory infection. She completed the antibiotics one and half weeks ago. Upper wrist for symptoms have resolved. She is now reporting diarrhea with multiple frequent watery stools for the past 4 days. She is also complaining of lower abdominal cramping associated with tissue area and urinary frequency as well as pain radiating into the lower back.  In regards to her chest pain patient reports she took Gas-X at home as well as Aleve and Imodium without any improvement in her symptoms. She called EMS she was given aspirin and 2 nitroglycerin with subsequent decrease in pain. She reports her initial chest pain was 9/10 and decreased to 2/10 upon arrival to the ER. Patient continues to have nagging discomfort. ER her EKG was normal out any ischemic changes. Troponin was normal. Two-view chest x-ray was unremarkable.  Review of Systems   In addition to the HPI above,  No Fever-chills, myalgias or other constitutional symptoms No Headache, changes with Vision or hearing, new weakness, tingling, numbness in any extremity, No problems swallowing food or Liquids, indigestion/reflux No Cough, palpitations, orthopnea or DOE No melena or hematochezia,  no dark tarry stools No new skin rashes, lesions, masses or bruises, No new joints pains-aches No recent weight gain or loss No polyuria, polydypsia or polyphagia,  *A full 10 point Review of Systems was done, except as stated above, all other Review of Systems were negative.  Social History History  Substance Use Topics  . Smoking status: Current Every Day Smoker -- 0.25 packs/day for 4 years    Types: Cigarettes  . Smokeless tobacco: Never Used  . Alcohol Use: No    Family History Family History  Problem Relation Age of Onset  . Hyperlipidemia Mother   . Arthritis Mother   . Heart attack Father   . Diabetes Father   . Hypertension Father   . Breast cancer Sister   . Cancer Sister     cervical  . Breast cancer Paternal Aunt     Prior  to Admission medications   Medication Sig Start Date End Date Taking? Authorizing Provider  albuterol (PROVENTIL HFA;VENTOLIN HFA) 108 (90 BASE) MCG/ACT inhaler Inhale 2 puffs into the lungs every 6 (six) hours as needed.   Yes Historical Provider, MD  albuterol (PROVENTIL) (2.5 MG/3ML) 0.083% nebulizer solution Take 2.5 mg by nebulization every 6 (six) hours as needed for wheezing or shortness of breath.   Yes Historical Provider, MD  aspirin EC 81 MG tablet Take 81 mg by mouth daily.   Yes Historical Provider, MD  ibuprofen (ADVIL,MOTRIN) 200 MG tablet Take 400 mg by mouth every 6 (six) hours as needed for pain (cold).   Yes Historical Provider, MD  lisinopril (PRINIVIL,ZESTRIL) 5 MG tablet Take 5 mg by mouth daily.   Yes Historical Provider, MD  metFORMIN (GLUCOPHAGE) 500 MG tablet Take 250 mg by mouth at bedtime and may repeat dose one time if needed.   Yes Historical Provider, MD  mometasone (NASONEX) 50 MCG/ACT nasal spray Place 2 sprays into the nose daily.   Yes Historical Provider, MD  Multiple Vitamins-Minerals (MULTIVITAMIN WITH MINERALS) tablet Take 1 tablet by mouth daily.   Yes Historical Provider, MD  nitroGLYCERIN (NITROSTAT) 0.4 MG SL tablet Place 0.4 mg under the tongue every 5 (five) minutes as needed for chest pain.   Yes Historical Provider, MD  Omega-3 Fatty Acids (FISH OIL PO) Take 1 tablet by mouth daily.   Yes Historical Provider, MD  omeprazole (PRILOSEC) 20 MG capsule Take 20 mg by mouth daily.   Yes Historical Provider, MD  risperiDONE (RISPERDAL) 2 MG tablet Take 2 mg by mouth 2 (two) times daily.   Yes Historical Provider, MD  simethicone (MYLICON) 80 MG chewable tablet Chew 80 mg by mouth once.   Yes Historical Provider, MD  simvastatin (ZOCOR) 40 MG tablet Take 40 mg by mouth daily.   Yes Historical Provider, MD  traZODone (DESYREL) 100 MG tablet Take 200 mg by mouth at bedtime.   Yes Historical Provider, MD  valACYclovir (VALTREX) 500 MG tablet Take 1,000 mg by mouth  daily.    Yes Historical Provider, MD  HYDROcodone-acetaminophen (HYCET) 7.5-325 mg/15 ml solution Take 15 mLs by mouth every 6 (six) hours as needed for cough. Patient not taking: Reported on 02/20/2015 07/21/13   Elmyra Ricks Pisciotta, PA-C  promethazine (PHENERGAN) 25 MG tablet Take 1 tablet (25 mg total) by mouth every 6 (six) hours as needed for nausea. Patient not taking: Reported on 02/20/2015 07/21/13   Elmyra Ricks Pisciotta, PA-C    Allergies  Allergen Reactions  . Sulfa Antibiotics Anaphylaxis    Physical Exam  Vitals  Blood pressure 145/60, pulse 55, temperature 97.9 F (36.6 C), temperature source Oral, resp. rate 15, height 5\' 1"  (1.549 m), weight 193 lb 14.4 oz (87.952 kg), SpO2 99 %.   General:  In no acute distress, appears healthy and well nourished  Psych:  Normal affect, Denies Suicidal or Homicidal ideations, Awake Alert, Oriented X 3. Speech pressured, she appears hypomanic, no apparent short term memory deficits  Neuro:   No focal neurological deficits, CN II through XII intact, Strength 5/5 all 4 extremities, Sensation intact all 4 extremities.  ENT:  Ears and Eyes appear Normal, Conjunctivae clear, PER. Moist oral mucosa without erythema or exudates.  Neck:  Supple, No lymphadenopathy appreciated  Respiratory:  Symmetrical chest wall movement, Good air movement bilaterally, CTAB. Room Air  Cardiac:  RRR, No Murmurs, no LE edema noted, no JVD, No carotid bruits, peripheral pulses palpable at 2+  Abdomen:  Positive bowel sounds, Soft, mildly tender suprapubic without guard or rebound, Non distended,  No masses appreciated, no obvious hepatosplenomegaly  Genitourinary: No definitive CVAT on exam, mild central lumbar tenderness with palpation Skin:  No Cyanosis, Normal Skin Turgor, No Skin Rash or Bruise.  Extremities: Symmetrical without obvious trauma or injury,  no effusions.  Data Review  CBC  Recent Labs Lab 02/20/15 1151 02/20/15 1217  WBC 5.2  --   HGB  11.9* 13.6  HCT 35.7* 40.0  PLT 277  --   MCV 83.8  --   MCH 27.9  --   MCHC 33.3  --   RDW 15.1  --   LYMPHSABS 1.6  --   MONOABS 0.5  --   EOSABS 0.1  --   BASOSABS 0.0  --     Chemistries   Recent Labs Lab 02/20/15 1151 02/20/15 1217  NA 142 143  K 3.5 3.4*  CL 108 106  CO2 24  --   GLUCOSE 111* 109*  BUN 13 15  CREATININE 0.81 0.80  CALCIUM 9.1  --   AST 19  --   ALT 20  --   ALKPHOS 62  --   BILITOT 0.4  --     estimated creatinine clearance is 84.9 mL/min (by C-G formula based on Cr of 0.8).  No results for input(s): TSH, T4TOTAL, T3FREE, THYROIDAB in the last 72 hours.  Invalid input(s): FREET3  Coagulation profile No results for input(s): INR, PROTIME in the last 168 hours.  No results for input(s): DDIMER in the last 72 hours.  Cardiac Enzymes  Recent Labs Lab 02/20/15 1151  TROPONINI <0.03    Invalid input(s): POCBNP  Urinalysis    Component Value Date/Time   COLORURINE YELLOW 07/21/2013 1205   APPEARANCEUR CLOUDY* 07/21/2013 1205   LABSPEC 1.016 07/21/2013 1205   PHURINE 6.0 07/21/2013 1205   GLUCOSEU NEGATIVE 07/21/2013 1205   HGBUR LARGE* 07/21/2013 1205   BILIRUBINUR NEGATIVE 07/21/2013 1205   KETONESUR NEGATIVE 07/21/2013 1205   PROTEINUR 30* 07/21/2013 1205   UROBILINOGEN 1.0 07/21/2013 1205   NITRITE NEGATIVE 07/21/2013 1205   LEUKOCYTESUR TRACE* 07/21/2013 1205    Imaging results:   Dg Chest 2 View  02/20/2015   CLINICAL DATA:  Chest tightness.  EXAM: CHEST  2 VIEW  COMPARISON:  None.  FINDINGS: Mediastinum hilar structures normal. Lungs are clear. Heart size normal. No pleural effusion or pneumothorax. No acute bony abnormality.  IMPRESSION: No acute cardiopulmonary disease.   Electronically Signed   By: Marcello Moores  Register   On: 02/20/2015 10:39  EKG: Sinus rhythm without any acute ischemic changes   Assessment & Plan  Principal Problem:   Chest pain -Admitted to telemetry observational status -Cycle  troponins -Heart score equals 5: Risk factors include postmenopausal female, diabetes, dyslipidemia, hypertension, positive family history, mild to moderate concerning symptoms -Check EKG in a.m. -Begin full dose aspirin and low-dose beta blocker -Symptoms did resolve with nitroglycerin so may benefit from Myoview study -Check 2-D echocardiogram -EKG in a.m.  Active Problems:   Suprapubic pain -Unclear if related to urinary tract infection or other intra-abdominal process -Check urinalysis and culture -No fever or leukocytosis and no indication to begin empiric antibiotics at this juncture    Diarrhea -Patient recently started on Protonix and diarrhea simply could be side effect from this medication -She has also been given antibiotics several weeks ago and diarrhea started up 4 days ago after antibiotics rule out C. Difficile -IV fluids    Hypokalemia -Oral replete -Labs in a.m.    Diabetes mellitus, type 2 -Hold metformin -Moderate sliding scale insulin -Check hemoglobin A1c    HTN  -Continue Prinivil    Dyslipidemia -Continue Zocor    Bipolar affective disorder/Schizophrenia -Continue home medications of risperidone and Desyrel    DVT Prophylaxis: Subcutaneous heparin  Family Communication:  Husband at bedside   Code Status: full code   Condition:  Stable  Time spent in minutes : 60   Marcia Lepera L. ANP on 02/20/2015 at 1:30 PM  Between 7am to 7pm - Pager - [phone]  After 7pm go to [email or web address] - password [email or web address]  And look for the night coverage person covering me after hours  [facility]

## 2015-02-21 ENCOUNTER — Observation Stay (HOSPITAL_COMMUNITY): Payer: Medicaid Other

## 2015-02-21 DIAGNOSIS — I10 Essential (primary) hypertension: Secondary | ICD-10-CM | POA: Diagnosis not present

## 2015-02-21 DIAGNOSIS — R079 Chest pain, unspecified: Principal | ICD-10-CM

## 2015-02-21 DIAGNOSIS — R072 Precordial pain: Secondary | ICD-10-CM

## 2015-02-21 DIAGNOSIS — E785 Hyperlipidemia, unspecified: Secondary | ICD-10-CM | POA: Diagnosis not present

## 2015-02-21 DIAGNOSIS — R1013 Epigastric pain: Secondary | ICD-10-CM | POA: Diagnosis not present

## 2015-02-21 DIAGNOSIS — J45901 Unspecified asthma with (acute) exacerbation: Secondary | ICD-10-CM | POA: Diagnosis not present

## 2015-02-21 DIAGNOSIS — E119 Type 2 diabetes mellitus without complications: Secondary | ICD-10-CM | POA: Diagnosis not present

## 2015-02-21 LAB — TROPONIN I
Troponin I: <0.03 ng/mL (ref <0.031)
Troponin I: <0.03 ng/mL (ref <0.031)

## 2015-02-21 LAB — URINE CULTURE
COLONY COUNT: NO GROWTH
Culture: NO GROWTH

## 2015-02-21 LAB — HEMOGLOBIN A1C
Hgb A1c MFr Bld: 6.4 % — ABNORMAL HIGH (ref 4.8–5.6)
Mean Plasma Glucose: 137 mg/dL

## 2015-02-21 LAB — BASIC METABOLIC PANEL
Anion gap: 8 (ref 5–15)
BUN: 9 mg/dL (ref 6–23)
CHLORIDE: 112 mmol/L (ref 96–112)
CO2: 24 mmol/L (ref 19–32)
Calcium: 8.7 mg/dL (ref 8.4–10.5)
Creatinine, Ser: 0.72 mg/dL (ref 0.50–1.10)
GFR calc Af Amer: >90 mL/min (ref >90)
GFR calc non Af Amer: >90 mL/min (ref >90)
GLUCOSE: 112 mg/dL — AB (ref 70–99)
POTASSIUM: 4 mmol/L (ref 3.5–5.1)
SODIUM: 144 mmol/L (ref 135–145)

## 2015-02-21 LAB — GLUCOSE, CAPILLARY
Glucose-Capillary: 116 mg/dL — ABNORMAL HIGH (ref 70–99)
Glucose-Capillary: 119 mg/dL — ABNORMAL HIGH (ref 70–99)
Glucose-Capillary: 103 mg/dL — ABNORMAL HIGH (ref 70–99)

## 2015-02-21 MED ORDER — OMEPRAZOLE 20 MG PO CPDR: 40.0000 mg | DELAYED_RELEASE_CAPSULE | Freq: Every day | ORAL | Status: DC

## 2015-02-21 MED ORDER — PANTOPRAZOLE SODIUM 40 MG IV SOLR
40.0000 mg | Freq: Once | INTRAVENOUS | Status: AC
  Administered 2015-02-21: 40 mg via INTRAVENOUS
  Filled 2015-02-21: qty 40

## 2015-02-21 MED ORDER — PREDNISONE 20 MG PO TABS: 60.0000 mg | ORAL_TABLET | Freq: Every day | ORAL | Status: DC

## 2015-02-21 MED ORDER — REGADENOSON 0.4 MG/5ML IV SOLN
INTRAVENOUS | Status: AC
  Filled 2015-02-21: qty 5

## 2015-02-21 MED ORDER — PANTOPRAZOLE SODIUM 40 MG PO TBEC
40.0000 mg | DELAYED_RELEASE_TABLET | Freq: Every day | ORAL | Status: DC
Start: 2015-02-22 — End: 2015-02-21

## 2015-02-21 MED ORDER — TRAMADOL HCL 50 MG PO TABS
50.0000 mg | ORAL_TABLET | Freq: Four times a day (QID) | ORAL | Status: DC | PRN
  Administered 2015-02-21: 50 mg via ORAL
  Filled 2015-02-21: qty 1

## 2015-02-21 MED ORDER — REGADENOSON 0.4 MG/5ML IV SOLN
0.4000 mg | Freq: Once | INTRAVENOUS | Status: AC
  Administered 2015-02-21: 0.4 mg via INTRAVENOUS
  Filled 2015-02-21: qty 5

## 2015-02-21 MED ORDER — SIMETHICONE 80 MG PO CHEW
80.0000 mg | CHEWABLE_TABLET | Freq: Four times a day (QID) | ORAL | Status: DC | PRN
  Administered 2015-02-21: 80 mg via ORAL
  Filled 2015-02-21: qty 1

## 2015-02-21 MED ORDER — NICOTINE 21 MG/24HR TD PT24: 21.0000 mg | MEDICATED_PATCH | Freq: Every day | TRANSDERMAL | Status: DC

## 2015-02-21 MED ORDER — TECHNETIUM TC 99M SESTAMIBI GENERIC - CARDIOLITE
10.0000 | Freq: Once | INTRAVENOUS | Status: AC | PRN
  Administered 2015-02-21: 10 via INTRAVENOUS

## 2015-02-21 MED ORDER — TECHNETIUM TC 99M SESTAMIBI GENERIC - CARDIOLITE
30.0000 | Freq: Once | INTRAVENOUS | Status: AC | PRN
  Administered 2015-02-21: 30 via INTRAVENOUS

## 2015-02-21 MED ORDER — METOPROLOL TARTRATE 25 MG PO TABS: 25.0000 mg | ORAL_TABLET | Freq: Two times a day (BID) | ORAL | Status: DC

## 2015-02-21 MED ORDER — IPRATROPIUM-ALBUTEROL 0.5-2.5 (3) MG/3ML IN SOLN: 3.0000 mL | Freq: Four times a day (QID) | RESPIRATORY_TRACT | Status: DC | PRN

## 2015-02-21 MED ORDER — IPRATROPIUM-ALBUTEROL 0.5-2.5 (3) MG/3ML IN SOLN
3.0000 mL | RESPIRATORY_TRACT | Status: DC
  Administered 2015-02-21: 3 mL via RESPIRATORY_TRACT
  Filled 2015-02-21: qty 3

## 2015-02-21 MED ORDER — PREDNISONE 20 MG PO TABS: ORAL_TABLET | ORAL | Status: DC

## 2015-02-21 NOTE — Progress Notes (Signed)
UR completed 

## 2015-02-21 NOTE — Progress Notes (Signed)
Lexiscan CL performed.  Rosaria Ferries, PA-C 02/21/2015 12:17 PM Beeper [phone]

## 2015-02-21 NOTE — Discharge Summary (Signed)
Physician Discharge Summary  Melissa Pham VVO:[id number] DOB: 08-Dec-1965 DOA: 02/20/2015  PCP: Jenell Milliner, MD  Admit date: 02/20/2015 Discharge date: 02/21/2015  Time spent: 30 minutes  Recommendations for Outpatient Follow-up:  1. Follow up with PCP in one week.  2. Please get GI referral for evaluation of gastric emptying.    Discharge Diagnoses:  Principal Problem:   Chest pain Active Problems:   Suprapubic pain   Diarrhea   Diabetes mellitus, type 2   HTN (hypertension)   Dyslipidemia   Bipolar affective disorder   Schizophrenia   Hypokalemia   Discharge Condition: improved  Diet recommendation: low sodium carb modified diet  Filed Weights   02/20/15 1026 02/20/15 1520 02/21/15 0400  Weight: 87.952 kg (193 lb 14.4 oz) 88.3 kg (194 lb 10.7 oz) 90.266 kg (199 lb)    History of present illness:  49 year old lady admitted for chest pain  Hospital Course:  Atypical chest pain: her symptoms are consistent with abdominal cramps and bloating and flatulence relieved with PPI and gi cocktail.  Because of her risk factors, she underwent stress myoview and echocardiogram , which were unremarkable. She was given prescription for protonix and discharged home.    Procedures:  Stress myoview  echocardiogram  Consultations:  cardiology  Discharge Exam: Filed Vitals:   02/21/15 1349  BP:   Pulse: 60  Temp:   Resp:     General: alert afebrile comfortable Cardiovascular: s1s2 Respiratory: ctab  Discharge Instructions   Discharge Instructions    Diet - low sodium heart healthy    Complete by:  As directed      Diet Carb Modified    Complete by:  As directed      Discharge instructions    Complete by:  As directed   Follow up with PCP in 1 to 2 weeks.          Current Discharge Medication List    START taking these medications   Details  ipratropium-albuterol (DUONEB) 0.5-2.5 (3) MG/3ML SOLN Take 3 mLs by nebulization every 6 (six) hours as  needed. Qty: 360 mL, Refills: 0    metoprolol tartrate (LOPRESSOR) 25 MG tablet Take 1 tablet (25 mg total) by mouth 2 (two) times daily. Qty: 60 tablet, Refills: 0    nicotine (NICODERM CQ - DOSED IN MG/24 HOURS) 21 mg/24hr patch Place 1 patch (21 mg total) onto the skin daily. Qty: 28 patch, Refills: 0    predniSONE (DELTASONE) 20 MG tablet Prednisone 40 mg daily for 5 days. Qty: 10 tablet, Refills: 0      CONTINUE these medications which have CHANGED   Details  omeprazole (PRILOSEC) 20 MG capsule Take 2 capsules (40 mg total) by mouth daily. Qty: 30 capsule, Refills: 1      CONTINUE these medications which have NOT CHANGED   Details  albuterol (PROVENTIL HFA;VENTOLIN HFA) 108 (90 BASE) MCG/ACT inhaler Inhale 2 puffs into the lungs every 6 (six) hours as needed.    albuterol (PROVENTIL) (2.5 MG/3ML) 0.083% nebulizer solution Take 2.5 mg by nebulization every 6 (six) hours as needed for wheezing or shortness of breath.    aspirin EC 81 MG tablet Take 81 mg by mouth daily.    lisinopril (PRINIVIL,ZESTRIL) 5 MG tablet Take 5 mg by mouth daily.    metFORMIN (GLUCOPHAGE) 500 MG tablet Take 250 mg by mouth at bedtime and may repeat dose one time if needed.    mometasone (NASONEX) 50 MCG/ACT nasal spray Place 2 sprays into  the nose daily.    Multiple Vitamins-Minerals (MULTIVITAMIN WITH MINERALS) tablet Take 1 tablet by mouth daily.    nitroGLYCERIN (NITROSTAT) 0.4 MG SL tablet Place 0.4 mg under the tongue every 5 (five) minutes as needed for chest pain.    Omega-3 Fatty Acids (FISH OIL PO) Take 1 tablet by mouth daily.    risperiDONE (RISPERDAL) 2 MG tablet Take 2 mg by mouth 2 (two) times daily.    simethicone (MYLICON) 80 MG chewable tablet Chew 80 mg by mouth once.    simvastatin (ZOCOR) 40 MG tablet Take 40 mg by mouth daily.    traZODone (DESYREL) 100 MG tablet Take 200 mg by mouth at bedtime.    valACYclovir (VALTREX) 500 MG tablet Take 1,000 mg by mouth daily.        STOP taking these medications     ibuprofen (ADVIL,MOTRIN) 200 MG tablet      HYDROcodone-acetaminophen (HYCET) 7.5-325 mg/15 ml solution      promethazine (PHENERGAN) 25 MG tablet        Allergies  Allergen Reactions  . Sulfa Antibiotics Anaphylaxis   Follow-up Information    Follow up with Jenell Milliner, MD. Schedule an appointment as soon as possible for a visit in 1 week.   Specialty:  Cardiology   Contact information:   [address] [place] [address] [phone]        The results of significant diagnostics from this hospitalization (including imaging, microbiology, ancillary and laboratory) are listed below for reference.    Significant Diagnostic Studies: Dg Chest 2 View  02/20/2015   CLINICAL DATA:  Chest tightness.  EXAM: CHEST  2 VIEW  COMPARISON:  None.  FINDINGS: Mediastinum hilar structures normal. Lungs are clear. Heart size normal. No pleural effusion or pneumothorax. No acute bony abnormality.  IMPRESSION: No acute cardiopulmonary disease.   Electronically Signed   By: Marcello Moores  Register   On: 02/20/2015 10:39   Nm Myocar Multi W/spect W/wall Motion / Ef  02/21/2015   CLINICAL DATA:  Chest pain. History of sickle cell trait, diabetes, hypertension and hyperlipidemia.  EXAM: MYOCARDIAL IMAGING WITH SPECT (REST AND PHARMACOLOGIC-STRESS)  GATED LEFT VENTRICULAR WALL MOTION STUDY  LEFT VENTRICULAR EJECTION FRACTION  TECHNIQUE: Standard myocardial SPECT imaging was performed after resting intravenous injection of 10 mCi Tc-80m sestamibi. Subsequently, intravenous infusion of Lexiscan was performed under the supervision of the Cardiology staff. At peak effect of the drug, 30 mCi Tc-74m sestamibi was injected intravenously and standard myocardial SPECT imaging was performed. Quantitative gated imaging was also performed to evaluate left ventricular wall motion, and estimate left ventricular ejection fraction.  COMPARISON:  Chest radiograph - 02/20/2015; chest CT  - 07/09/2010  FINDINGS: Raw images: Mild to moderate breast and chest wall attenuation is seen on both the provided rest and stress images. There is mild GI attenuation seen on both the provided rest and stress images.  Perfusion: There is a small area of non perfusion involving the apical aspect of the anterior wall of the left ventricle which minimally improves on the provided stress images in wall potentially indicative of a prior site of myocardial infarction, is without associated regional wall motion abnormality. There is no mismatched areas of perfusion to suggest pharmacologically induced ischemia.  Wall Motion: Normal left ventricular wall motion. No left ventricular dilation.  Left Ventricular Ejection Fraction: 61 %  End diastolic volume 82 ml  End systolic volume 32 ml  IMPRESSION: 1. Small area of non perfusion involving the apical aspect  of the anterior wall of the left ventricle which minimally improves on the provided stress images and is without regional wall motion abnormality - as such, this is favored to represent an area of attenuation though conceivably, an area of prior infarction could have a similar scintigraphic appearance. 2. No scintigraphic evidence of pharmacologically induced ischemia. 3. Normal wall motion.  Ejection fraction - 61%.   Electronically Signed   By: Sandi Mariscal M.D.   On: 02/21/2015 13:30    Microbiology: No results found for this or any previous visit (from the past 240 hour(s)).   Labs: Basic Metabolic Panel:  Recent Labs Lab 02/20/15 1151 02/20/15 1217 02/21/15 0650  NA 142 143 144  K 3.5 3.4* 4.0  CL 108 106 112  CO2 24  --  24  GLUCOSE 111* 109* 112*  BUN 13 15 9   CREATININE 0.81 0.80 0.72  CALCIUM 9.1  --  8.7   Liver Function Tests:  Recent Labs Lab 02/20/15 1151  AST 19  ALT 20  ALKPHOS 62  BILITOT 0.4  PROT 6.3  ALBUMIN 3.6    Recent Labs Lab 02/20/15 1151  LIPASE 22   No results for input(s): AMMONIA in the last 168  hours. CBC:  Recent Labs Lab 02/20/15 1151 02/20/15 1217  WBC 5.2  --   NEUTROABS 3.0  --   HGB 11.9* 13.6  HCT 35.7* 40.0  MCV 83.8  --   PLT 277  --    Cardiac Enzymes:  Recent Labs Lab 02/20/15 1151 02/20/15 1843 02/21/15 0014 02/21/15 0650  TROPONINI <0.03 <0.03 <0.03 <0.03   BNP: BNP (last 3 results) No results for input(s): BNP in the last 8760 hours.  ProBNP (last 3 results) No results for input(s): PROBNP in the last 8760 hours.  CBG:  Recent Labs Lab 02/20/15 1658 02/20/15 2109 02/21/15 0740 02/21/15 1335  GLUCAP 110* 117* 116* 119*       Signed:  Shi Blankenship  [facility] 02/21/2015, 2:31 PM

## 2015-02-21 NOTE — Progress Notes (Signed)
  Echocardiogram 2D Echocardiogram has been performed.  Melissa Pham 02/21/2015, 9:00 AM

## 2015-02-21 NOTE — Progress Notes (Signed)
Pt IV and placed off telemetry. Gave pt education and reviewed pt discharge instructions to pt. Pt packed belongings and will see PCP tomorrow am. Instructed pt on resources to quit smoking.

## 2015-02-21 NOTE — Consult Note (Addendum)
CARDIOLOGY CONSULT NOTE   Patient ID: Melissa Pham MRN: [id number], DOB/AGE: 07-12-1966   Admit date: 02/20/2015 Date of Consult: 02/21/2015  Primary Physician: [place] Primary Cardiologist: none   Reason for consult:  Chest pain  Problem List  Past Medical History  Diagnosis Date  . Asthma   . Sickle cell trait   . Hypertension   . Diabetes mellitus without complication   . Hyperlipidemia     Past Surgical History  Procedure Laterality Date  . Cervical biopsy  w/ loop electrode excision    . Tubal ligation    . Left eye surgery      Allergies  Allergies  Allergen Reactions  . Sulfa Antibiotics Anaphylaxis   HPI   49 year old female patient of history of asthma, hypertension, diabetes, dyslipidemia, sickle cell trait, bipolar disorder and schizophrenia. Presented to the ER with complaints of nocturnal chest pain. Patient reports that she was awakened around 3 AM today when she developed midsternal chest discomfort that was tight and felt like an elephant sitting on her chest. This pain radiated into the axilla down into her left arm causing some numbness. Patient reports that she does have known peripheral neuropathy and involving her arms but this was different. Patient reports that she passed flatus without any improvement in her symptoms. She was noting nausea and diaphoresis associated with this discomfort. She became anxious and later became short of breath. She has not noticed similar symptoms with exertion although according to her own report she does not typically exert herself noting she does not climb stairs.   She does report that she plays around with her grandchildren in the kitchen running a little bit has not experienced similar pain. She is also reporting about 4 days worth of diarrhea. She reports about 3 weeks ago she was given antibiotics for enlarged cervical lymph nodes and presumed respiratory infection. She  completed the antibiotics one and half weeks ago. Also complaining of diarrhea with multiple frequent watery stools for the past 4 days. She is also complaining of lower abdominal cramping associated with tissue area and urinary frequency as well as pain radiating into the lower back.  In regards to her chest pain patient reports she took Gas-X at home as well as Aleve and Imodium without any improvement in her symptoms. She called EMS she was given aspirin and 2 nitroglycerin with subsequent decrease in pain. She reports her initial chest pain was 9/10 and decreased to 2/10 upon arrival to the ER. Patient continues to have nagging discomfort. ER her EKG was normal out any ischemic changes. Troponin was normal. Two-view chest x-ray was unremarkable.  Her father had first MI in his early 23' requiring CABG, since then another MI.   Inpatient Medications  . aspirin EC  325 mg Oral Daily  . heparin  5,000 Units Subcutaneous 3 times per day  . insulin aspart  0-15 Units Subcutaneous TID WC  . insulin aspart  0-5 Units Subcutaneous QHS  . lisinopril  5 mg Oral Daily  . metoprolol tartrate  25 mg Oral BID  . multivitamin with minerals  1 tablet Oral Daily  . omega-3 acid ethyl esters  1 g Oral Daily  . pantoprazole  40 mg Oral Daily  . risperiDONE  2 mg Oral BID  . simvastatin  40 mg Oral Daily  . traZODone  200 mg Oral QHS  . valACYclovir  1,000 mg Oral Daily    Family History Family  History  Problem Relation Age of Onset  . Hyperlipidemia Mother   . Arthritis Mother   . Heart attack Father   . Diabetes Father   . Hypertension Father   . Breast cancer Sister   . Cancer Sister     cervical  . Breast cancer Paternal Aunt      Social History History   Social History  . Marital Status: Single    Spouse Name: N/A  . Number of Children: N/A  . Years of Education: N/A   Occupational History  . Not on file.   Social History Main Topics  . Smoking status: Current Every Day Smoker  -- 0.25 packs/day for 4 years    Types: Cigarettes  . Smokeless tobacco: Never Used  . Alcohol Use: No  . Drug Use: No     Comment: no drug use for 7 years  . Sexual Activity: Not Currently    Birth Control/ Protection: Surgical   Other Topics Concern  . Not on file   Social History Narrative     Review of Systems  General:  No chills, fever, night sweats or weight changes.  Cardiovascular:  No chest pain, dyspnea on exertion, edema, orthopnea, palpitations, paroxysmal nocturnal dyspnea. Dermatological: No rash, lesions/masses Respiratory: No cough, dyspnea Urologic: No hematuria, dysuria Abdominal:   No nausea, vomiting, diarrhea, bright red blood per rectum, melena, or hematemesis Neurologic:  No visual changes, wkns, changes in mental status. All other systems reviewed and are otherwise negative except as noted above.  Physical Exam  Blood pressure 121/64, pulse 50, temperature 98.6 F (37 C), temperature source Oral, resp. rate 20, height 5\' 1"  (1.549 m), weight 199 lb (90.266 kg), SpO2 96 %.  General: Pleasant, NAD Psych: Normal affect. Neuro: Alert and oriented X 3. Moves all extremities spontaneously. HEENT: Normal  Neck: Supple without bruits or JVD. Lungs:  Resp regular and unlabored, CTA. Heart: RRR no s3, s4, or murmurs. Abdomen: Soft, tender, distended, BS + x 4.  Extremities: No clubbing, cyanosis or edema. DP/PT/Radials 2+ and equal bilaterally.  Labs   Recent Labs  02/20/15 1151 02/20/15 1843 02/21/15 0014 02/21/15 0650  TROPONINI <0.03 <0.03 <0.03 <0.03   Lab Results  Component Value Date   WBC 5.2 02/20/2015   HGB 13.6 02/20/2015   HCT 40.0 02/20/2015   MCV 83.8 02/20/2015   PLT 277 02/20/2015    Recent Labs Lab 02/20/15 1151  02/21/15 0650  NA 142  < > 144  K 3.5  < > 4.0  CL 108  < > 112  CO2 24  --  24  BUN 13  < > 9  CREATININE 0.81  < > 0.72  CALCIUM 9.1  --  8.7  PROT 6.3  --   --   BILITOT 0.4  --   --   ALKPHOS 62  --    --   ALT 20  --   --   AST 19  --   --   GLUCOSE 111*  < > 112*  < > = values in this interval not displayed. Lab Results  Component Value Date   CHOL 138 08/29/2008   HDL 41 08/29/2008   LDLCALC 83 08/29/2008   TRIG 71 08/29/2008   No results found for: DDIMER Invalid input(s): POCBNP  Radiology/Studies  Dg Chest 2 View  02/20/2015   CLINICAL DATA:  Chest tightness.  EXAM: CHEST  2 VIEW  COMPARISON:  None.  FINDINGS: Mediastinum hilar structures normal. Lungs  are clear. Heart size normal. No pleural effusion or pneumothorax. No acute bony abnormality.  IMPRESSION: No acute cardiopulmonary disease.   Electronically Signed   By: Marcello Moores  Register   On: 02/20/2015 10:39    Echocardiogram - none  ECG: SR, low voltage QRS     ASSESSMENT AND PLAN  49 year old female   1. Atypical chest pain - non-exertional nocturnal, alleviated with NTG but also with immodium and gasex. Symptoms seem to be related to GI tract, possible UTI. Troponins are negative, ECG unremarkable with mild low voltage QRS. Considering her risk factors that include DM, HTN, HLP and significant FH of premature CAD, I would proceed with an echocardiogram and a Lexiscan nuclear stress test.   2. Hypertension - well controlled  3. Hyperlipidemia - on simvastatin, HDL and TG at goal, LDL goal < 70 for a diabetic person, consider switching to atorvastatin 40 mg po daily   Signed, Dorothy Spark, MD, [facility] 02/21/2015, 8:37 AM

## 2015-03-22 ENCOUNTER — Other Ambulatory Visit (HOSPITAL_COMMUNITY): Payer: Self-pay | Admitting: *Deleted

## 2015-03-22 DIAGNOSIS — Z1231 Encounter for screening mammogram for malignant neoplasm of breast: Secondary | ICD-10-CM

## 2015-03-29 ENCOUNTER — Ambulatory Visit (HOSPITAL_COMMUNITY)
Admission: RE | Admit: 2015-03-29 | Discharge: 2015-03-29 | Disposition: A | Discharge status: Home / Self Care | Payer: MEDICAID | Source: Ambulatory Visit | Attending: *Deleted | Admitting: *Deleted

## 2015-03-29 DIAGNOSIS — Z1231 Encounter for screening mammogram for malignant neoplasm of breast: Secondary | ICD-10-CM

## 2015-05-02 ENCOUNTER — Encounter: Payer: Self-pay | Admitting: Internal Medicine

## 2015-06-26 ENCOUNTER — Other Ambulatory Visit: Payer: Self-pay | Admitting: Internal Medicine

## 2015-06-26 DIAGNOSIS — R109 Unspecified abdominal pain: Secondary | ICD-10-CM

## 2015-06-26 DIAGNOSIS — R102 Pelvic and perineal pain: Secondary | ICD-10-CM

## 2015-07-03 ENCOUNTER — Other Ambulatory Visit: Payer: Medicaid Other

## 2015-07-04 ENCOUNTER — Other Ambulatory Visit: Payer: Medicaid Other

## 2015-07-11 ENCOUNTER — Ambulatory Visit
Admission: RE | Admit: 2015-07-11 | Discharge: 2015-07-11 | Disposition: A | Discharge status: Home / Self Care | Payer: Medicaid Other | Source: Ambulatory Visit | Attending: Internal Medicine | Admitting: Internal Medicine

## 2015-07-11 DIAGNOSIS — R102 Pelvic and perineal pain: Secondary | ICD-10-CM

## 2015-07-11 DIAGNOSIS — R109 Unspecified abdominal pain: Secondary | ICD-10-CM

## 2015-07-12 ENCOUNTER — Encounter: Payer: Medicaid Other | Admitting: Internal Medicine

## 2015-07-13 ENCOUNTER — Telehealth: Payer: Self-pay | Admitting: *Deleted

## 2015-07-13 ENCOUNTER — Ambulatory Visit (AMBULATORY_SURGERY_CENTER): Payer: Self-pay | Admitting: *Deleted

## 2015-07-13 VITALS — Ht 62.0 in | Wt 198.0 lb

## 2015-07-13 DIAGNOSIS — Z1211 Encounter for screening for malignant neoplasm of colon: Secondary | ICD-10-CM

## 2015-07-13 MED ORDER — NA SULFATE-K SULFATE-MG SULF 17.5-3.13-1.6 GM/177ML PO SOLN: ORAL | Status: DC

## 2015-07-13 NOTE — Telephone Encounter (Signed)
Dr Deatra Ina: pt here for PV today for direct screening colonoscopy scheduled for 07/27/15.  Pt has several complaints today.  She says that she has been having heartburn for several months; taking Omeprazole without relief.  She also says that she has been having alternating constipation and diarrhea for the last 5 months.  Within the last week she is having rectal bleeding after a BM.  Is she okay for direct colonoscopy or do want her to schedule OV first?  Thanks, Juliann Pulse

## 2015-07-13 NOTE — Progress Notes (Signed)
No allergies to eggs or soy. No problems with anesthesia.  Pt given Emmi instructions for colonoscopy  No oxygen use  No diet drug use  

## 2015-07-13 NOTE — Telephone Encounter (Signed)
Talked with pt: colonoscopy for 9/22 cancelled.  New pt appt with Dr Deatra Ina scheduled for 10/28 at 3:45

## 2015-07-13 NOTE — Telephone Encounter (Signed)
Office visit first would be preferable

## 2015-07-27 ENCOUNTER — Encounter: Payer: Medicaid Other | Admitting: Gastroenterology

## 2015-08-15 ENCOUNTER — Ambulatory Visit: Payer: Medicaid Other | Admitting: Physician Assistant

## 2015-09-01 ENCOUNTER — Ambulatory Visit: Payer: Medicaid Other | Admitting: Gastroenterology

## 2015-10-09 ENCOUNTER — Encounter (HOSPITAL_COMMUNITY): Payer: Self-pay | Admitting: Emergency Medicine

## 2015-10-09 ENCOUNTER — Emergency Department (HOSPITAL_COMMUNITY): Payer: Medicaid Other

## 2015-10-09 ENCOUNTER — Emergency Department (HOSPITAL_COMMUNITY)
Admission: EM | Admit: 2015-10-09 | Discharge: 2015-10-09 | Disposition: A | Discharge status: Home / Self Care | Payer: Medicaid Other | Attending: Emergency Medicine | Admitting: Emergency Medicine

## 2015-10-09 DIAGNOSIS — F319 Bipolar disorder, unspecified: Secondary | ICD-10-CM | POA: Insufficient documentation

## 2015-10-09 DIAGNOSIS — Z7982 Long term (current) use of aspirin: Secondary | ICD-10-CM | POA: Diagnosis not present

## 2015-10-09 DIAGNOSIS — E785 Hyperlipidemia, unspecified: Secondary | ICD-10-CM | POA: Diagnosis not present

## 2015-10-09 DIAGNOSIS — R0789 Other chest pain: Secondary | ICD-10-CM | POA: Diagnosis not present

## 2015-10-09 DIAGNOSIS — F4320 Adjustment disorder, unspecified: Secondary | ICD-10-CM | POA: Insufficient documentation

## 2015-10-09 DIAGNOSIS — Z79899 Other long term (current) drug therapy: Secondary | ICD-10-CM | POA: Insufficient documentation

## 2015-10-09 DIAGNOSIS — Z7951 Long term (current) use of inhaled steroids: Secondary | ICD-10-CM | POA: Diagnosis not present

## 2015-10-09 DIAGNOSIS — J45909 Unspecified asthma, uncomplicated: Secondary | ICD-10-CM | POA: Diagnosis not present

## 2015-10-09 DIAGNOSIS — F419 Anxiety disorder, unspecified: Secondary | ICD-10-CM | POA: Diagnosis present

## 2015-10-09 DIAGNOSIS — K219 Gastro-esophageal reflux disease without esophagitis: Secondary | ICD-10-CM | POA: Insufficient documentation

## 2015-10-09 DIAGNOSIS — Z791 Long term (current) use of non-steroidal anti-inflammatories (NSAID): Secondary | ICD-10-CM | POA: Diagnosis not present

## 2015-10-09 DIAGNOSIS — F17210 Nicotine dependence, cigarettes, uncomplicated: Secondary | ICD-10-CM | POA: Diagnosis not present

## 2015-10-09 DIAGNOSIS — F4321 Adjustment disorder with depressed mood: Secondary | ICD-10-CM

## 2015-10-09 DIAGNOSIS — E119 Type 2 diabetes mellitus without complications: Secondary | ICD-10-CM | POA: Insufficient documentation

## 2015-10-09 DIAGNOSIS — F209 Schizophrenia, unspecified: Secondary | ICD-10-CM | POA: Diagnosis not present

## 2015-10-09 DIAGNOSIS — I10 Essential (primary) hypertension: Secondary | ICD-10-CM | POA: Insufficient documentation

## 2015-10-09 DIAGNOSIS — Z862 Personal history of diseases of the blood and blood-forming organs and certain disorders involving the immune mechanism: Secondary | ICD-10-CM | POA: Insufficient documentation

## 2015-10-09 DIAGNOSIS — R079 Chest pain, unspecified: Secondary | ICD-10-CM | POA: Insufficient documentation

## 2015-10-09 LAB — CBC
HEMATOCRIT: 35.6 % — AB (ref 36.0–46.0)
Hemoglobin: 12 g/dL (ref 12.0–15.0)
MCH: 27.9 pg (ref 26.0–34.0)
MCHC: 33.7 g/dL (ref 30.0–36.0)
MCV: 82.8 fL (ref 78.0–100.0)
PLATELETS: 349 10*3/uL (ref 150–400)
RBC: 4.3 MIL/uL (ref 3.87–5.11)
RDW: 14.8 % (ref 11.5–15.5)
WBC: 5.3 10*3/uL (ref 4.0–10.5)

## 2015-10-09 LAB — BASIC METABOLIC PANEL
Anion gap: 9 (ref 5–15)
BUN: 9 mg/dL (ref 6–20)
CO2: 26 mmol/L (ref 22–32)
Calcium: 9.5 mg/dL (ref 8.9–10.3)
Chloride: 109 mmol/L (ref 101–111)
Creatinine, Ser: 0.75 mg/dL (ref 0.44–1.00)
GFR calc Af Amer: >60 mL/min (ref >60)
GLUCOSE: 109 mg/dL — AB (ref 65–99)
POTASSIUM: 3.1 mmol/L — AB (ref 3.5–5.1)
Sodium: 144 mmol/L (ref 135–145)

## 2015-10-09 LAB — TROPONIN I

## 2015-10-09 NOTE — ED Notes (Signed)
Upon initial assessment pt denies pain; at present time, pt reports intermittent central chest pressure/cramping.

## 2015-10-09 NOTE — ED Notes (Signed)
Bed: [id number] Expected date: 10/09/15 Expected time: 2:26 PM Means of arrival: Ambulance Comments: ABD PAIN

## 2015-10-09 NOTE — ED Provider Notes (Signed)
CSN: [id number]     Arrival date & time 10/09/15  1435 History   First MD Initiated Contact with Patient 10/09/15 1459     Chief Complaint  Patient presents with  . Anxiety     (Consider location/radiation/quality/duration/timing/severity/associated sxs/prior Treatment) Patient is a 49 y.o. female presenting with anxiety. The history is provided by the patient.  Anxiety This is a recurrent problem. The current episode started 1 to 2 hours ago. The problem occurs constantly. The problem has not changed since onset.Associated symptoms include chest pain (tightness and sweating ). Exacerbated by: lost father this week after losing son. Relieved by: therapy, has not been since losing father. The treatment provided no relief.    Past Medical History  Diagnosis Date  . Asthma   . Sickle cell trait ([place])   . Hypertension   . Diabetes mellitus without complication ([place])   . Hyperlipidemia   . Bipolar disorder ([place])   . Schizophrenia ([place])   . ADHD (attention deficit hyperactivity disorder)   . Anxiety   . Depression   . Blood transfusion without reported diagnosis 1985    after childbirth  . GERD (gastroesophageal reflux disease)    Past Surgical History  Procedure Laterality Date  . Cervical biopsy  w/ loop electrode excision    . Tubal ligation  1989  . Left eye surgery  1970   Family History  Problem Relation Age of Onset  . Hyperlipidemia Mother   . Arthritis Mother   . Heart attack Father   . Diabetes Father   . Hypertension Father   . Breast cancer Sister   . Cancer Sister     cervical  . Breast cancer Paternal Aunt   . Colon cancer Maternal Grandmother 27  . Esophageal cancer Neg Hx   . Rectal cancer Neg Hx    Social History  Substance Use Topics  . Smoking status: Current Every Day Smoker -- 0.25 packs/day for 4 years    Types: Cigarettes  . Smokeless tobacco: Never Used  . Alcohol Use: No   OB History    Gravida Para Term Preterm AB TAB SAB Ectopic  Multiple Living   7    3 2 1   4      Review of Systems  Respiratory: Positive for chest tightness (atypical, previously noted on multiple occasions, worse since loss of father).   Cardiovascular: Positive for chest pain (tightness and sweating ).  Psychiatric/Behavioral: The patient is nervous/anxious.   All other systems reviewed and are negative.     Allergies  Sulfa antibiotics  Home Medications   Prior to Admission medications   Medication Sig Start Date End Date Taking? Authorizing Provider  albuterol (PROVENTIL HFA;VENTOLIN HFA) 108 (90 BASE) MCG/ACT inhaler Inhale 2 puffs into the lungs every 6 (six) hours as needed.    Historical Provider, MD  albuterol (PROVENTIL) (2.5 MG/3ML) 0.083% nebulizer solution Take 2.5 mg by nebulization every 6 (six) hours as needed for wheezing or shortness of breath.    Historical Provider, MD  aspirin EC 81 MG tablet Take 81 mg by mouth daily.    Historical Provider, MD  benztropine (COGENTIN) 1 MG tablet Take 1 mg by mouth 2 (two) times daily.    Historical Provider, MD  cyclobenzaprine (FLEXERIL) 5 MG tablet Take 5 mg by mouth 2 (two) times daily.    Historical Provider, MD  Fish Oil-Cholecalciferol (FISH OIL + D3 PO) Take by mouth daily.    Historical Provider, MD  gabapentin (NEURONTIN)  100 MG capsule Take 100 mg by mouth 2 (two) times daily.    Historical Provider, MD  hydrOXYzine (ATARAX/VISTARIL) 25 MG tablet Take 25 mg by mouth every 6 (six) hours as needed.    Historical Provider, MD  ipratropium-albuterol (DUONEB) 0.5-2.5 (3) MG/3ML SOLN Take 3 mLs by nebulization every 6 (six) hours as needed. Patient not taking: Reported on 07/13/2015 02/21/15   Hosie Poisson, MD  lisinopril (PRINIVIL,ZESTRIL) 5 MG tablet Take 5 mg by mouth daily.    Historical Provider, MD  lithium 300 MG tablet Take 300 mg by mouth 2 (two) times daily.    Historical Provider, MD  meloxicam (MOBIC) 15 MG tablet Take 15 mg by mouth daily.    Historical Provider, MD   metFORMIN (GLUCOPHAGE) 500 MG tablet Take 250 mg by mouth at bedtime and may repeat dose one time if needed.    Historical Provider, MD  metoprolol tartrate (LOPRESSOR) 25 MG tablet Take 1 tablet (25 mg total) by mouth 2 (two) times daily. 02/21/15   Hosie Poisson, MD  mometasone (NASONEX) 50 MCG/ACT nasal spray Place 2 sprays into the nose daily.    Historical Provider, MD  Multiple Vitamins-Minerals (MULTIVITAMIN WITH MINERALS) tablet Take 1 tablet by mouth daily.    Historical Provider, MD  Na Sulfate-K Sulfate-Mg Sulf (SUPREP BOWEL PREP) SOLN suprep as directed.  No substitutions 07/13/15   Inda Castle, MD  nicotine (NICODERM CQ - DOSED IN MG/24 HOURS) 21 mg/24hr patch Place 1 patch (21 mg total) onto the skin daily. Patient not taking: Reported on 07/13/2015 02/21/15   Hosie Poisson, MD  nitroGLYCERIN (NITROSTAT) 0.4 MG SL tablet Place 0.4 mg under the tongue every 5 (five) minutes as needed for chest pain.    Historical Provider, MD  Omega-3 Fatty Acids (FISH OIL PO) Take 1 tablet by mouth daily.    Historical Provider, MD  omeprazole (PRILOSEC) 20 MG capsule Take 2 capsules (40 mg total) by mouth daily. 02/21/15   Hosie Poisson, MD  risperiDONE (RISPERDAL) 2 MG tablet Take 2 mg by mouth 2 (two) times daily.    Historical Provider, MD  simethicone (MYLICON) 80 MG chewable tablet Chew 80 mg by mouth once.    Historical Provider, MD  simvastatin (ZOCOR) 40 MG tablet Take 40 mg by mouth daily.    Historical Provider, MD  traZODone (DESYREL) 100 MG tablet Take 200 mg by mouth at bedtime.    Historical Provider, MD   BP 131/84 mmHg  Pulse 73  Temp(Src) 98.2 F (36.8 C) (Oral)  Resp 18  SpO2 100% Physical Exam  Constitutional: She is oriented to person, place, and time. She appears well-developed and well-nourished. No distress.  HENT:  Head: Normocephalic.  Eyes: Conjunctivae are normal.  Neck: Neck supple. No tracheal deviation present.  Cardiovascular: Normal rate, regular rhythm and normal  heart sounds.   Pulmonary/Chest: Effort normal and breath sounds normal. No respiratory distress. She has no wheezes. She has no rales.  Abdominal: Soft. She exhibits no distension.  Neurological: She is alert and oriented to person, place, and time.  Skin: Skin is warm and dry. She is not diaphoretic.  Psychiatric: Her mood appears anxious (and crying when tallking about father).    ED Course  Procedures (including critical care time) Labs Review Labs Reviewed  CBC - Abnormal; Notable for the following:    HCT 35.6 (*)    All other components within normal limits  BASIC METABOLIC PANEL - Abnormal; Notable for the following:  Potassium 3.1 (*)    Glucose, Bld 109 (*)    All other components within normal limits  TROPONIN I    Imaging Review Dg Chest 2 View  10/09/2015  CLINICAL DATA:  49 year old female with generalized chest pain. Initial encounter. EXAM: CHEST  2 VIEW COMPARISON:  02/20/2015. FINDINGS: Normal cardiac size and mediastinal contours. Stable lung volumes. Lungs remain clear. No pneumothorax or pulmonary edema. Visualized tracheal air column is within normal limits. No acute osseous abnormality identified. IMPRESSION: No acute cardiopulmonary abnormality. Electronically Signed   By: Genevie Ann M.D.   On: 10/09/2015 15:48   I have personally reviewed and evaluated these images and lab results as part of my medical decision-making.   EKG Interpretation   Date/Time:  Monday October 09 2015 15:02:41 EST Ventricular Rate:  60 PR Interval:  145 QRS Duration: 72 QT Interval:  453 QTC Calculation: 453 R Axis:   56 Text Interpretation:  Sinus rhythm Nonspecific T abnormalities, anterior  leads No significant change since last tracing Confirmed by Graham Hyun MD,  Quillian Quince [phone]) on 10/09/2015 3:14:48 PM      MDM   Final diagnoses:  Grief reaction    49 year old female with previous admissions for atypical chest pain and negative Myoview stress test 8 months ago presents  with intermittent squeezing chest pain, diaphoresis, and extreme anxiety coming in waves since recently finding her father dead on the floor of the kitchen covered blood from a medical emergency. She was cleaning the blood today and began having an exacerbation of her symptoms. She does have a history of anxiety, bipolar, schizophrenia but is not noting any increase in symptoms suggesting an exacerbation of these problems. EKG and chest x-ray are unchanged from prior, troponin negative, doubt ACS given recent negative stress test and lack of typical features with emotional exacerbation.  This is likely a grief response which is not pathological in nature given the patient's extreme circumstances. She is monitored in the emergency department without signs of clinical deterioration and discharged with planned follow-up with her primary care physician as needed and her psychiatrist on the 13th as previously scheduled or sooner.    Leo Grosser, MD 10/09/15 [phone]

## 2015-10-09 NOTE — Discharge Instructions (Signed)
Complicated Grieving Grief is a normal response to the death of someone close to you. Feelings of fear, anger, and guilt can affect almost everyone who loses a loved one. It is also common to have symptoms of depression while you are grieving. These include problems with sleep, loss of appetite, and lack of energy. They may last for weeks or months after a loss. Complicated grief is different from normal grief or depression. Normal grieving involves sadness and feelings of loss, but these feelings are not constant. Complicated grief is a constant and severe type of grief. It interferes with your ability to function normally. It may last for several months to a year or longer. Complicated grief may require treatment from a mental health care provider. CAUSES  It is not known why some people continue to struggle with grief and others do not. You may be at higher risk for complicated grief if:  The death of your loved one was sudden or unexpected.  The death of your loved one was due to a violent event.  Your loved one committed suicide.  Your loved one was a child or a young person.  You were very close to or dependent on the loved one.  You have a history of depression. SIGNS AND SYMPTOMS Signs and symptoms of complicated grief may include:  Feeling disbelief or numbness.  Being unable to enjoy good memories of your loved one.  Needing to avoid anything that reminds you of your loved one.  Being unable to stop thinking about the death.  Feeling intense anger or guilt.  Feeling alone and hopeless.  Feeling that your life is meaningless and empty.  Losing the desire to live. DIAGNOSIS Your health care provider may diagnose complicated grief if:  You have constant symptoms of grief for 6-12 months or longer.  Your symptoms are interfering with your ability to live your life. Your health care provider may want you to see a mental health care provider. Many symptoms of depression  are similar to the symptoms of complicated grief. It is important to be evaluated for complicated grief along with other mental health conditions. TREATMENT  Talk therapy with a mental health provider is the most common treatment for complicated grief. During therapy, you will learn healthy ways to cope with the loss of your loved one. In some cases, your mental health care provider may also recommend antidepressant medicines. HOME CARE INSTRUCTIONS  Take care of yourself.  Eat regular meals and maintain a healthy diet. Eat plenty of fruits, vegetables, and whole grains.  Try to get some exercise each day.  Keep regular hours for sleep. Try to get at least 8 hours of sleep each night.  Do not use drugs or alcohol to ease your symptoms.  Take medicines only as directed by your health care provider.  Spend time with friends and loved ones.  Consider joining a grief (bereavement) support group to help you deal with your loss.  Keep all follow-up visits as directed by your health care provider. This is important. SEEK MEDICAL CARE IF:  Your symptoms keep you from functioning normally.  Your symptoms do not get better with treatment. SEEK IMMEDIATE MEDICAL CARE IF:  You have serious thoughts of hurting yourself or someone else.  You have suicidal feelings.   This information is not intended to replace advice given to you by your health care provider. Make sure you discuss any questions you have with your health care provider.   Document Released: 10/21/2005   Document Revised: 07/12/2015 Document Reviewed: 03/31/2014 Elsevier Interactive Patient Education 2016 [employer]  

## 2015-10-09 NOTE — ED Notes (Signed)
Per EMS pt requests evaluation for anxiety; recent stressors. Pt hx of anxiety and depression; denies SI/HI; denies pain.

## 2015-11-08 ENCOUNTER — Ambulatory Visit: Payer: Medicaid Other | Admitting: Gastroenterology

## 2016-02-27 ENCOUNTER — Other Ambulatory Visit: Payer: Self-pay

## 2016-02-27 DIAGNOSIS — Z1231 Encounter for screening mammogram for malignant neoplasm of breast: Secondary | ICD-10-CM

## 2016-03-29 ENCOUNTER — Ambulatory Visit: Payer: Medicaid Other

## 2016-05-31 ENCOUNTER — Ambulatory Visit
Admission: RE | Admit: 2016-05-31 | Discharge: 2016-05-31 | Disposition: A | Discharge status: Home / Self Care | Payer: Medicare Other | Source: Ambulatory Visit

## 2016-05-31 DIAGNOSIS — Z1231 Encounter for screening mammogram for malignant neoplasm of breast: Secondary | ICD-10-CM

## 2017-01-06 DIAGNOSIS — J40 Bronchitis, not specified as acute or chronic: Secondary | ICD-10-CM | POA: Diagnosis not present

## 2017-01-06 DIAGNOSIS — J45909 Unspecified asthma, uncomplicated: Secondary | ICD-10-CM | POA: Diagnosis not present

## 2017-01-06 DIAGNOSIS — I10 Essential (primary) hypertension: Secondary | ICD-10-CM | POA: Diagnosis not present

## 2017-01-06 DIAGNOSIS — E785 Hyperlipidemia, unspecified: Secondary | ICD-10-CM | POA: Diagnosis not present

## 2017-01-06 DIAGNOSIS — Z87891 Personal history of nicotine dependence: Secondary | ICD-10-CM | POA: Diagnosis not present

## 2017-01-06 DIAGNOSIS — E1140 Type 2 diabetes mellitus with diabetic neuropathy, unspecified: Secondary | ICD-10-CM | POA: Diagnosis not present

## 2017-01-06 DIAGNOSIS — F3132 Bipolar disorder, current episode depressed, moderate: Secondary | ICD-10-CM | POA: Diagnosis not present

## 2017-01-06 DIAGNOSIS — K219 Gastro-esophageal reflux disease without esophagitis: Secondary | ICD-10-CM | POA: Diagnosis not present

## 2017-01-06 DIAGNOSIS — E119 Type 2 diabetes mellitus without complications: Secondary | ICD-10-CM | POA: Diagnosis not present

## 2017-01-06 DIAGNOSIS — M1990 Unspecified osteoarthritis, unspecified site: Secondary | ICD-10-CM | POA: Diagnosis not present

## 2017-01-15 ENCOUNTER — Encounter (HOSPITAL_COMMUNITY): Payer: Self-pay | Admitting: *Deleted

## 2017-01-15 ENCOUNTER — Emergency Department (HOSPITAL_COMMUNITY)
Admission: EM | Admit: 2017-01-15 | Discharge: 2017-01-15 | Disposition: A | Discharge status: Home / Self Care | Payer: Medicare Other | Attending: Emergency Medicine | Admitting: Emergency Medicine

## 2017-01-15 ENCOUNTER — Emergency Department (HOSPITAL_COMMUNITY): Payer: Medicare Other

## 2017-01-15 DIAGNOSIS — Y999 Unspecified external cause status: Secondary | ICD-10-CM | POA: Diagnosis not present

## 2017-01-15 DIAGNOSIS — F17210 Nicotine dependence, cigarettes, uncomplicated: Secondary | ICD-10-CM | POA: Insufficient documentation

## 2017-01-15 DIAGNOSIS — F909 Attention-deficit hyperactivity disorder, unspecified type: Secondary | ICD-10-CM | POA: Insufficient documentation

## 2017-01-15 DIAGNOSIS — Z7984 Long term (current) use of oral hypoglycemic drugs: Secondary | ICD-10-CM | POA: Insufficient documentation

## 2017-01-15 DIAGNOSIS — W000XXA Fall on same level due to ice and snow, initial encounter: Secondary | ICD-10-CM | POA: Insufficient documentation

## 2017-01-15 DIAGNOSIS — M25561 Pain in right knee: Secondary | ICD-10-CM

## 2017-01-15 DIAGNOSIS — Y9289 Other specified places as the place of occurrence of the external cause: Secondary | ICD-10-CM | POA: Diagnosis not present

## 2017-01-15 DIAGNOSIS — S99911A Unspecified injury of right ankle, initial encounter: Secondary | ICD-10-CM | POA: Diagnosis present

## 2017-01-15 DIAGNOSIS — E119 Type 2 diabetes mellitus without complications: Secondary | ICD-10-CM | POA: Diagnosis not present

## 2017-01-15 DIAGNOSIS — I10 Essential (primary) hypertension: Secondary | ICD-10-CM | POA: Diagnosis not present

## 2017-01-15 DIAGNOSIS — Y9301 Activity, walking, marching and hiking: Secondary | ICD-10-CM | POA: Insufficient documentation

## 2017-01-15 DIAGNOSIS — G8911 Acute pain due to trauma: Secondary | ICD-10-CM | POA: Diagnosis not present

## 2017-01-15 DIAGNOSIS — S8251XA Displaced fracture of medial malleolus of right tibia, initial encounter for closed fracture: Secondary | ICD-10-CM | POA: Insufficient documentation

## 2017-01-15 DIAGNOSIS — Z7982 Long term (current) use of aspirin: Secondary | ICD-10-CM | POA: Diagnosis not present

## 2017-01-15 DIAGNOSIS — S82891A Other fracture of right lower leg, initial encounter for closed fracture: Secondary | ICD-10-CM

## 2017-01-15 DIAGNOSIS — Z79899 Other long term (current) drug therapy: Secondary | ICD-10-CM | POA: Diagnosis not present

## 2017-01-15 DIAGNOSIS — J45909 Unspecified asthma, uncomplicated: Secondary | ICD-10-CM | POA: Insufficient documentation

## 2017-01-15 MED ORDER — HYDROCODONE-ACETAMINOPHEN 5-325 MG PO TABS: 1.0000 | ORAL_TABLET | Freq: Four times a day (QID) | ORAL | 0 refills | Status: DC | PRN

## 2017-01-15 MED ORDER — IBUPROFEN 800 MG PO TABS: 800.0000 mg | ORAL_TABLET | Freq: Four times a day (QID) | ORAL | 0 refills | Status: DC | PRN

## 2017-01-15 NOTE — ED Notes (Signed)
Esignature not working. Pt received dc paperwork and agreeable to discharge

## 2017-01-15 NOTE — ED Provider Notes (Signed)
[facility] DEPT Provider Note   CSN: [id number] Arrival date & time: 01/15/17  1026  By signing my name below, I, Ethelle Lyon Long, attest that this documentation has been prepared under the direction and in the presence of Alix Stowers P. Zetha Kuhar, PA-C. Electronically Signed: Ethelle Lyon Long, Scribe. 01/15/2017. 12:33 PM.  History   Chief Complaint Chief Complaint  Patient presents with  . Knee Pain   The history is provided by the patient and medical records. No language interpreter was used.    HPI Comments:  Melissa Pham is an obese 51 y.o. female with a PMHx of Asthma, DM, HLD, HTN, and GERD, who presents to the Emergency Department by way of ambulance complaining of sudden onset, 7/10, medial and lateral, right knee pain s/p a fall two days ago. Pt reports slipping on the snow while walking to the store and falling at ground level, striking her right knee and right ankle on the ground. Pt has associated symptoms of right ankle and right foot tenderness. She tried some pain pills left over at home, ace bandage, knee sleeve, icy hot, and ice compress at home with mild relief of her pain. Ambulation and movement exacerbate pain. Pt denies numbness, tingling, muscle weakness and any other complaints at this time. Pt is a current every day smoker.   Past Medical History:  Diagnosis Date  . ADHD (attention deficit hyperactivity disorder)   . Anxiety   . Asthma   . Bipolar disorder ([place])   . Blood transfusion without reported diagnosis 1985   after childbirth  . Depression   . Diabetes mellitus without complication ([place])   . GERD (gastroesophageal reflux disease)   . Hyperlipidemia   . Hypertension   . Schizophrenia ([place])   . Sickle cell trait [facility])     Patient Active Problem List   Diagnosis Date Noted  . Chest pain 02/20/2015  . Suprapubic pain 02/20/2015  . Diarrhea 02/20/2015  . Diabetes mellitus, type 2 ([place]) 02/20/2015  . HTN (hypertension) 02/20/2015  . Dyslipidemia  02/20/2015  . Bipolar affective disorder ([place]) 02/20/2015  . Schizophrenia ([place]) 02/20/2015  . Hypokalemia 02/20/2015    Past Surgical History:  Procedure Laterality Date  . CERVICAL BIOPSY  W/ LOOP ELECTRODE EXCISION    . left eye surgery  1970  . TUBAL LIGATION  1989    OB History    Gravida Para Term Preterm AB Living   7       3 4    SAB TAB Ectopic Multiple Live Births   1 2             Home Medications    Prior to Admission medications   Medication Sig Start Date End Date Taking? Authorizing Provider  albuterol (PROVENTIL HFA;VENTOLIN HFA) 108 (90 BASE) MCG/ACT inhaler Inhale 2 puffs into the lungs every 6 (six) hours as needed for wheezing or shortness of breath.     Historical Provider, MD  aspirin EC 81 MG tablet Take 81 mg by mouth daily.    Historical Provider, MD  benztropine (COGENTIN) 1 MG tablet Take 1 mg by mouth 2 (two) times daily.    Historical Provider, MD  cyclobenzaprine (FLEXERIL) 5 MG tablet Take 5 mg by mouth 2 (two) times daily as needed for muscle spasms.     Historical Provider, MD  Fish Oil-Cholecalciferol (FISH OIL + D3 PO) Take by mouth daily.    Historical Provider, MD  gabapentin (NEURONTIN) 100 MG capsule Take 100 mg by mouth 2 (  two) times daily.    Historical Provider, MD  HYDROcodone-acetaminophen (NORCO) 5-325 MG tablet Take 1 tablet by mouth every 6 (six) hours as needed for moderate pain. 01/15/17   Ozella Almond Braxtyn Bojarski, PA-C  hydrOXYzine (ATARAX/VISTARIL) 25 MG tablet Take 25 mg by mouth every 6 (six) hours as needed for itching.     Historical Provider, MD  ibuprofen (ADVIL,MOTRIN) 800 MG tablet Take 1 tablet (800 mg total) by mouth every 6 (six) hours as needed. 01/15/17   Donnice Nielsen Pilcher Sanae Willetts, PA-C  ipratropium-albuterol (DUONEB) 0.5-2.5 (3) MG/3ML SOLN Take 3 mLs by nebulization every 6 (six) hours as needed. Patient taking differently: Take 3 mLs by nebulization every 6 (six) hours as needed (shortness of breath/wheezing).  02/21/15   Hosie Poisson, MD  lisinopril (PRINIVIL,ZESTRIL) 5 MG tablet Take 5 mg by mouth daily.    Historical Provider, MD  lithium 300 MG tablet Take 300 mg by mouth 2 (two) times daily.    Historical Provider, MD  meloxicam (MOBIC) 15 MG tablet Take 15 mg by mouth daily.    Historical Provider, MD  metFORMIN (GLUCOPHAGE) 500 MG tablet Take 250 mg by mouth at bedtime and may repeat dose one time if needed. For high blood pressure    Historical Provider, MD  metoprolol succinate (TOPROL-XL) 50 MG 24 hr tablet Take 50 mg by mouth daily. Take with or immediately following a meal.    Historical Provider, MD  metoprolol tartrate (LOPRESSOR) 25 MG tablet Take 1 tablet (25 mg total) by mouth 2 (two) times daily. Patient not taking: Reported on 10/09/2015 02/21/15   Hosie Poisson, MD  mometasone (NASONEX) 50 MCG/ACT nasal spray Place 2 sprays into the nose 2 (two) times daily as needed (allergies).     Historical Provider, MD  Multiple Vitamins-Minerals (MULTIVITAMIN WITH MINERALS) tablet Take 1 tablet by mouth daily.    Historical Provider, MD  Na Sulfate-K Sulfate-Mg Sulf (SUPREP BOWEL PREP) SOLN suprep as directed.  No substitutions Patient not taking: Reported on 10/09/2015 07/13/15   Inda Castle, MD  nicotine (NICODERM CQ - DOSED IN MG/24 HOURS) 21 mg/24hr patch Place 1 patch (21 mg total) onto the skin daily. Patient not taking: Reported on 07/13/2015 02/21/15   Hosie Poisson, MD  nitroGLYCERIN (NITROSTAT) 0.4 MG SL tablet Place 0.4 mg under the tongue every 5 (five) minutes as needed for chest pain.    Historical Provider, MD  Omega-3 Fatty Acids (FISH OIL PO) Take 1 tablet by mouth daily.    Historical Provider, MD  omeprazole (PRILOSEC) 20 MG capsule Take 2 capsules (40 mg total) by mouth daily. 02/21/15   Hosie Poisson, MD  PARoxetine (PAXIL) 20 MG tablet Take 5-10 mg by mouth daily as needed (hotflashes).     Historical Provider, MD  risperiDONE (RISPERDAL) 2 MG tablet Take 2 mg by mouth 2 (two) times daily.     Historical Provider, MD  simethicone (MYLICON) 80 MG chewable tablet Chew 80 mg by mouth 4 (four) times daily as needed for flatulence.     Historical Provider, MD  simvastatin (ZOCOR) 40 MG tablet Take 40 mg by mouth daily.    Historical Provider, MD  traZODone (DESYREL) 100 MG tablet Take 200 mg by mouth at bedtime as needed for sleep.     Historical Provider, MD  triamcinolone cream (KENALOG) 0.5 % Apply 1 application topically 2 (two) times daily as needed (affected area/ irritation).    Historical Provider, MD  valACYclovir (VALTREX) 1000 MG tablet Take 1,000  mg by mouth daily.    Historical Provider, MD    Family History Family History  Problem Relation Age of Onset  . Hyperlipidemia Mother   . Arthritis Mother   . Heart attack Father   . Diabetes Father   . Hypertension Father   . Breast cancer Sister   . Cancer Sister     cervical  . Breast cancer Paternal Aunt   . Colon cancer Maternal Grandmother 22  . Esophageal cancer Neg Hx   . Rectal cancer Neg Hx     Social History Social History  Substance Use Topics  . Smoking status: Current Every Day Smoker    Packs/day: 0.25    Years: 4.00    Types: Cigarettes  . Smokeless tobacco: Never Used  . Alcohol use No     Allergies   Sulfa antibiotics   Review of Systems Review of Systems  Musculoskeletal: Positive for arthralgias and myalgias.  Neurological: Negative for numbness.     Physical Exam Updated Vital Signs BP 110/63 (BP Location: Left Arm)   Pulse 90   Temp 98.2 F (36.8 C) (Oral)   Resp 15   SpO2 100%   Physical Exam  Constitutional: She appears well-developed and well-nourished. No distress.  HENT:  Head: Normocephalic and atraumatic.  Neck: Neck supple.  Cardiovascular: Normal rate, regular rhythm and normal heart sounds.   No murmur heard. Pulmonary/Chest: Effort normal and breath sounds normal. No respiratory distress. She has no wheezes.  Musculoskeletal: Normal range of motion.  Right  knee: No gross deformity noted. + swelling. Tenderness to medial joint line. Full ROM although with pain. No crepitus with McMurray's. No abnormal alignment or patellar mobility. No bruising, erythema, or warmth overlaying the joint. No varus/valgus laxity. Negative drawer's, negative Lachman's. 2+ DP pulses bilaterally. All compartments are soft. Sensation intact distal to injury. Right ankle with full ROM without pain. No tenderness to 5th metatarsal or forefoot. Mild tenderness to medial malleolus.   Neurological: She is alert.  Skin: Skin is warm and dry.  Nursing note and vitals reviewed.    ED Treatments / Results  DIAGNOSTIC STUDIES:  Oxygen Saturation is 99% on RA, normal by my interpretation.    COORDINATION OF CARE:  12:32 PM Discussed treatment plan with pt at bedside including XRs of right knee and right foot with knee immobilizer and pt agreed to plan.   Labs (all labs ordered are listed, but only abnormal results are displayed) Labs Reviewed - No data to display  EKG  EKG Interpretation None       Radiology Dg Ankle Complete Right  Result Date: 01/15/2017 CLINICAL DATA:  complaining of sudden onset, 7/10 right knee pain s/p a fall two days ago. Pt reports slipping on snow and falling down hill. Severe pain and stiffness in right knee and ankle. EXAM: RIGHT ANKLE - COMPLETE 3+ VIEW COMPARISON:  Right foot radiographs, 07/10/2010. FINDINGS: There is a small avulsion fracture from the inferior margin of the medial malleolus which may be acute or chronic. There is a possible fracture at the base of the fifth metatarsal, which has relatively smooth margins, and was possibly present on the prior right foot radiographs. This may be a chronic finding and should be considered a chronic finding if there is no associated point tenderness. There is no other evidence of a fracture. No bone lesion. The ankle mortise is normally spaced and aligned with no arthropathic change. There is a  small plantar calcaneal spur. Mild soft  tissue swelling is suggested over the lateral mid to hindfoot. IMPRESSION: 1. Small avulsion type fracture from the inferior margin of the medial malleolus, which may be acute or chronic. 2. Possible nondisplaced fracture of the base of the fifth metatarsal. This is also of unclear chronicity. This should be considered an acute fracture if it correlates with point tenderness over the base of the fifth metatarsal. 3. No other evidence of a fracture or acute finding. No dislocation. Electronically Signed   By: Lajean Manes M.D.   On: 01/15/2017 11:51   Dg Knee Complete 4 Views Right  Result Date: 01/15/2017 CLINICAL DATA:  Sudden onset of right knee pain since a fall 2 days ago. EXAM: RIGHT KNEE - COMPLETE 4+ VIEW COMPARISON:  None. FINDINGS: There is a prominent joint effusion. There is tricompartmental osteophyte formation. No discrete fracture. No dislocation. IMPRESSION: Prominent joint effusion. Tricompartmental osteoarthritis. Electronically Signed   By: Lorriane Shire M.D.   On: 01/15/2017 11:48    Procedures Procedures (including critical care time)  Medications Ordered in ED Medications - No data to display   Initial Impression / Assessment and Plan / ED Course  I have reviewed the triage vital signs and the nursing notes.  Pertinent labs & imaging results that were available during my care of the patient were reviewed by me and considered in my medical decision making (see chart for details).    Melissa Pham is a 51 y.o. female who presents to ED for right knee and ankle pain after fall two days ago. Patient endorses that her knee pain is more painful and the true reasons why she came today. X-ray of the knee negative. X-ray of ankle shows small avulsion fracture and possible nondisplaced fracture of the base of the 5th metatarsal both of which ? Acute vs. Chronic. On exam, patient has no tenderness to the 5th metatarsal and denies pain in the  area. She does have some mild tenderness to medial malleolus. Patient has had knee immobilizer in the past and requesting immobilizer here today which was provided. Ankle placed in ASO brace. She has crutches with her today. Patient agrees to call ortho today to schedule follow up appointment. Symptomatic home care instructions given. All questions answered.   Patient discussed with Dr. Sherry Ruffing who agrees with treatment plan.   Final Clinical Impressions(s) / ED Diagnoses   Final diagnoses:  Acute pain of right knee  Closed avulsion fracture of right ankle, initial encounter    New Prescriptions Discharge Medication List as of 01/15/2017  1:12 PM     I personally performed the services described in this documentation, which was scribed in my presence. The recorded information has been reviewed and is accurate.     [facility] Nakaila Freeze, PA-C 01/15/17 [place], MD 01/15/17 1346

## 2017-01-15 NOTE — Discharge Instructions (Signed)
It was my pleasure taking care of you today!   Ibuprofen as needed for mild to moderate pain. Pain medication only as needed for severe pain - This can make you very drowsy - please do not drink alcohol, operate heavy machinery or drive on this medication. Use crutches as needed for comfort. Ice and elevate knee throughout the day.  Call the orthopedist listed today or tomorrow to schedule follow up appointment for recheck of ongoing knee pain in one to two weeks. That appointment can be canceled with a 24-48 hour notice if complete resolution of pain.  Return to the ER for new or worsening symptoms, any additional concerns.

## 2017-01-15 NOTE — ED Triage Notes (Addendum)
Pt arrived by ems for right knee pain and ankle pain, had a fall on Monday during the snow.

## 2017-01-22 ENCOUNTER — Encounter (INDEPENDENT_AMBULATORY_CARE_PROVIDER_SITE_OTHER): Payer: Self-pay | Admitting: Physician Assistant

## 2017-01-22 ENCOUNTER — Ambulatory Visit (INDEPENDENT_AMBULATORY_CARE_PROVIDER_SITE_OTHER): Payer: Medicare Other

## 2017-01-22 ENCOUNTER — Ambulatory Visit (INDEPENDENT_AMBULATORY_CARE_PROVIDER_SITE_OTHER): Payer: Medicare Other | Admitting: Physician Assistant

## 2017-01-22 DIAGNOSIS — M25561 Pain in right knee: Secondary | ICD-10-CM | POA: Diagnosis not present

## 2017-01-22 DIAGNOSIS — M25571 Pain in right ankle and joints of right foot: Secondary | ICD-10-CM

## 2017-01-22 MED ORDER — LIDOCAINE HCL 1 % IJ SOLN
3.0000 mL | INTRAMUSCULAR | Status: AC | PRN
  Administered 2017-01-22: 3 mL

## 2017-01-22 MED ORDER — METHYLPREDNISOLONE ACETATE 40 MG/ML IJ SUSP
40.0000 mg | INTRAMUSCULAR | Status: AC | PRN
  Administered 2017-01-22: 40 mg via INTRA_ARTICULAR

## 2017-01-22 NOTE — Progress Notes (Signed)
Office Visit Note   Patient: Melissa Pham           Date of Birth: 17-Aug-1966           MRN: [id number] Visit Date: 01/22/2017              Requested by: Benito Mccreedy, MD [address] [place], [place] [address] PCP: Benito Mccreedy, MD   Assessment & Plan: Visit Diagnoses:  1. Pain in right ankle and joints of right foot   2. Acute pain of right knee     Plan: Recommend she work on quad strengthening and range of motion of the right knee. Ice and relative rest. Like for her to discontinue the knee brace. She's weightbearing as tolerated on the right lower extremity.The patient was able to ambulate out of the office without the knee immobilizer after the aspiration and injection of her right knee.  Follow-Up Instructions: Return in about 2 weeks (around 02/05/2017).   Orders:  Orders Placed This Encounter  Procedures  . Large Joint Injection/Arthrocentesis  . XR Foot Complete Right   No orders of the defined types were placed in this encounter.     Procedures: Large Joint Inj Date/Time: 01/22/2017 4:26 PM Performed by: Pete Pelt Authorized by: Pete Pelt   Consent Given by:  Patient Indications:  Pain Location:  Knee Site:  R knee Needle Size:  22 G Approach:  Anterolateral Ultrasound Guidance: No   Fluoroscopic Guidance: No   Medications:  3 mL lidocaine 1 %; 40 mg methylPREDNISolone acetate 40 MG/ML Aspiration Attempted: Yes   Aspirate amount (mL):  20 Aspirate:  Blood-tinged Patient tolerance:  Patient tolerated the procedure well with no immediate complications      Clinical Data: No additional findings.   Subjective: Chief Complaint  Patient presents with  . Right Ankle - Pain  . Right Foot - Pain  . Right Knee - Pain    Patient presents right knee, right ankle, and right foot pain after a fall on 01/13/2017 in the snow. She was seen at [facility] ED and had x-rays of the ankle and knee made. She was put in an  immobilizer and ASO and given crutches. She has medial knee pain and swelling. She states that she hurts from her toes all the way up to her knee. She has swelling on both sides of foot and is getting cramps in her toes. She is taking ibuprofen with no relief. She requests something for pain.   She states that her ankle really is not bothering her as much as the knee. She has not been bearing weight on the knee since the injury. Radiographs were obtained of her right knee and right ankle at [facility] and are reviewed. Right knee multiple view shows no acute fracture. Some mild tricompartmental changes. No effusion. Next right ankle multiple view shows talus well located within the ankle mortise no diastases. Small avulsion from fracture off of the distal medial malleolus. Otherwise no acute fractures no dislocation subluxation. Review of Systems Denies dizziness lightheadedness chest pain shortness breath fevers chills. Otherwise please see history of present illness.  Objective: Vital Signs: There were no vitals taken for this visit.  Physical Exam  Constitutional: She is oriented to person, place, and time. She appears well-developed and well-nourished. No distress.  Cardiovascular: Intact distal pulses.   Neurological: She is alert and oriented to person, place, and time.  Skin: She is not diaphoretic.    Ortho  Exam Left knee full range of motion without pain. Right knee she has tenderness along medial lateral joint line. She has guarded range of motion of the right knee but is able to bring it to full extension and flexion to approximately 100. Right knee effusion no abnormal warmth no erythema. No instability of either knee with valgus varus stressing and anterior drawer is negative bilaterally. Right knee McMurray's maneuver is equivocal secondary to patient's guarding. Right Calf nontender. Right Achilles is nontender and intact. Right foot she has tenderness throughout the mid foot.  Minimal edema. No ecchymosis appreciated. She has good range of motion of her right ankle with dorsally to plantar flexion is slight tenderness over the distal medial malleolus. Dorsal pedal pulses 2+. Sensation grossly intact throughout the foot.   Specialty Comments:  No specialty comments available.  Imaging: Xr Foot Complete Right  Result Date: 01/22/2017 3Views right foot: No acute fracture particularly the fifth metatarsal. No Lisfranc injury. No bony abnormalities throughout the foot. Slight Morton's type foot with the second metatarsal being longer first.    PMFS History: Patient Active Problem List   Diagnosis Date Noted  . Chest pain 02/20/2015  . Suprapubic pain 02/20/2015  . Diarrhea 02/20/2015  . Diabetes mellitus, type 2 ([place]) 02/20/2015  . HTN (hypertension) 02/20/2015  . Dyslipidemia 02/20/2015  . Bipolar affective disorder ([place]) 02/20/2015  . Schizophrenia ([place]) 02/20/2015  . Hypokalemia 02/20/2015   Past Medical History:  Diagnosis Date  . ADHD (attention deficit hyperactivity disorder)   . Anxiety   . Asthma   . Bipolar disorder ([place])   . Blood transfusion without reported diagnosis 1985   after childbirth  . Depression   . Diabetes mellitus without complication ([place])   . GERD (gastroesophageal reflux disease)   . Hyperlipidemia   . Hypertension   . Schizophrenia ([place])   . Sickle cell trait ([facility])     Family History  Problem Relation Age of Onset  . Hyperlipidemia Mother   . Arthritis Mother   . Heart attack Father   . Diabetes Father   . Hypertension Father   . Breast cancer Sister   . Cancer Sister     cervical  . Breast cancer Paternal Aunt   . Colon cancer Maternal Grandmother 85  . Esophageal cancer Neg Hx   . Rectal cancer Neg Hx     Past Surgical History:  Procedure Laterality Date  . CERVICAL BIOPSY  W/ LOOP ELECTRODE EXCISION    . left eye surgery  1970  . TUBAL LIGATION  1989   Social History   Occupational History  . Not  on file.   Social History Main Topics  . Smoking status: Current Every Day Smoker    Packs/day: 0.25    Years: 4.00    Types: Cigarettes  . Smokeless tobacco: Never Used  . Alcohol use No  . Drug use: No     Comment: no drug use for 7 years  . Sexual activity: Not Currently    Birth control/ protection: Surgical

## 2017-02-07 ENCOUNTER — Ambulatory Visit (INDEPENDENT_AMBULATORY_CARE_PROVIDER_SITE_OTHER): Payer: Medicare Other | Admitting: Physician Assistant

## 2017-02-13 ENCOUNTER — Encounter (INDEPENDENT_AMBULATORY_CARE_PROVIDER_SITE_OTHER): Payer: Self-pay | Admitting: Physician Assistant

## 2017-02-13 ENCOUNTER — Ambulatory Visit (INDEPENDENT_AMBULATORY_CARE_PROVIDER_SITE_OTHER): Payer: Medicare Other | Admitting: Physician Assistant

## 2017-02-13 DIAGNOSIS — M25561 Pain in right knee: Secondary | ICD-10-CM

## 2017-02-13 DIAGNOSIS — M7061 Trochanteric bursitis, right hip: Secondary | ICD-10-CM | POA: Diagnosis not present

## 2017-02-13 DIAGNOSIS — S93431D Sprain of tibiofibular ligament of right ankle, subsequent encounter: Secondary | ICD-10-CM

## 2017-02-13 MED ORDER — IBUPROFEN 800 MG PO TABS: 800.0000 mg | ORAL_TABLET | Freq: Three times a day (TID) | ORAL | 0 refills | Status: DC | PRN

## 2017-02-13 NOTE — Progress Notes (Signed)
Miss Mirelez returns today follow-up of her right knee and right ankle. She states that she is now able to ambulate about with one crutch at home. She's no longer wearing the knee immobilizer. She still having severe pain medial aspect of the right knee still having swelling in the knee. She's developed new pain in her right buttocks region. Wearing the ASO brace on the ankle still has pain over the anterior lateral aspect of the ankle. She states ibuprofen requests a refill on her ibuprofen. She is also asking for a knee brace for the right knee.  Right knee recurrent effusion. Tenderness along the medial joint line positive McMurray's. She has full extension flexion to at least 90. No instability valgus varus stress in right knee. Right ankle she has tenderness over the anterior talofibular ligament. No ecchymosis or edema of the ankle. Good dorsiflexion plantar flexion ankle. Right hip she has good range of motion the hip tenderness over the right trochanteric region.   Assessment/plan: MRI of the right knee rule out medial meniscal tear. Prescription given for ibuprofen 800 mg one by mouth Q8 hours no other NSAIDs while on ibuprofen. Right hip trochanteric bursitis offered injection patient defers therefore IT band stretching and excised shown Right ankle continue ASO brace wean out of it as tolerated. Ice relative rest. Hinged knee brace for the right knee. Follow-up after the MRI to go over results and discuss further treatment.

## 2017-02-14 ENCOUNTER — Other Ambulatory Visit (INDEPENDENT_AMBULATORY_CARE_PROVIDER_SITE_OTHER): Payer: Self-pay

## 2017-02-19 ENCOUNTER — Telehealth (INDEPENDENT_AMBULATORY_CARE_PROVIDER_SITE_OTHER): Payer: Self-pay | Admitting: Radiology

## 2017-02-19 NOTE — Telephone Encounter (Signed)
FYI - I have tried to contact this patient in regards to rescheduling her 02/27/17 MRI review appointment until after 03/03/17 when her MRI is scheduled.  Phone number is chart has been disconnected when calling.

## 2017-02-27 ENCOUNTER — Encounter (INDEPENDENT_AMBULATORY_CARE_PROVIDER_SITE_OTHER): Payer: Self-pay

## 2017-02-27 ENCOUNTER — Ambulatory Visit (INDEPENDENT_AMBULATORY_CARE_PROVIDER_SITE_OTHER): Payer: Medicare Other | Admitting: Physician Assistant

## 2017-03-03 ENCOUNTER — Ambulatory Visit
Admission: RE | Admit: 2017-03-03 | Discharge: 2017-03-03 | Disposition: A | Discharge status: Home / Self Care | Payer: Medicare Other | Source: Ambulatory Visit | Attending: Physician Assistant | Admitting: Physician Assistant

## 2017-03-03 DIAGNOSIS — M7989 Other specified soft tissue disorders: Secondary | ICD-10-CM | POA: Diagnosis not present

## 2017-03-03 DIAGNOSIS — M25561 Pain in right knee: Secondary | ICD-10-CM

## 2017-03-05 DIAGNOSIS — E119 Type 2 diabetes mellitus without complications: Secondary | ICD-10-CM | POA: Diagnosis not present

## 2017-03-06 DIAGNOSIS — K648 Other hemorrhoids: Secondary | ICD-10-CM | POA: Diagnosis not present

## 2017-03-06 DIAGNOSIS — K644 Residual hemorrhoidal skin tags: Secondary | ICD-10-CM | POA: Diagnosis not present

## 2017-03-06 DIAGNOSIS — Z1211 Encounter for screening for malignant neoplasm of colon: Secondary | ICD-10-CM | POA: Diagnosis not present

## 2017-03-06 DIAGNOSIS — K5730 Diverticulosis of large intestine without perforation or abscess without bleeding: Secondary | ICD-10-CM | POA: Diagnosis not present

## 2017-03-06 DIAGNOSIS — K635 Polyp of colon: Secondary | ICD-10-CM | POA: Diagnosis not present

## 2017-03-10 DIAGNOSIS — Z124 Encounter for screening for malignant neoplasm of cervix: Secondary | ICD-10-CM | POA: Diagnosis not present

## 2017-03-10 DIAGNOSIS — K219 Gastro-esophageal reflux disease without esophagitis: Secondary | ICD-10-CM | POA: Diagnosis not present

## 2017-03-10 DIAGNOSIS — E119 Type 2 diabetes mellitus without complications: Secondary | ICD-10-CM | POA: Diagnosis not present

## 2017-03-10 DIAGNOSIS — J45909 Unspecified asthma, uncomplicated: Secondary | ICD-10-CM | POA: Diagnosis not present

## 2017-03-10 DIAGNOSIS — M1990 Unspecified osteoarthritis, unspecified site: Secondary | ICD-10-CM | POA: Diagnosis not present

## 2017-03-10 DIAGNOSIS — E1140 Type 2 diabetes mellitus with diabetic neuropathy, unspecified: Secondary | ICD-10-CM | POA: Diagnosis not present

## 2017-03-10 DIAGNOSIS — E785 Hyperlipidemia, unspecified: Secondary | ICD-10-CM | POA: Diagnosis not present

## 2017-03-10 DIAGNOSIS — F3132 Bipolar disorder, current episode depressed, moderate: Secondary | ICD-10-CM | POA: Diagnosis not present

## 2017-03-10 DIAGNOSIS — Z87891 Personal history of nicotine dependence: Secondary | ICD-10-CM | POA: Diagnosis not present

## 2017-03-10 DIAGNOSIS — I10 Essential (primary) hypertension: Secondary | ICD-10-CM | POA: Diagnosis not present

## 2017-03-11 DIAGNOSIS — Z1211 Encounter for screening for malignant neoplasm of colon: Secondary | ICD-10-CM | POA: Diagnosis not present

## 2017-03-11 DIAGNOSIS — K635 Polyp of colon: Secondary | ICD-10-CM | POA: Diagnosis not present

## 2017-03-12 ENCOUNTER — Encounter (INDEPENDENT_AMBULATORY_CARE_PROVIDER_SITE_OTHER): Payer: Self-pay

## 2017-03-12 ENCOUNTER — Ambulatory Visit (INDEPENDENT_AMBULATORY_CARE_PROVIDER_SITE_OTHER): Payer: Medicare Other | Admitting: Physician Assistant

## 2017-03-13 ENCOUNTER — Ambulatory Visit (INDEPENDENT_AMBULATORY_CARE_PROVIDER_SITE_OTHER): Payer: Medicare Other | Admitting: Orthopaedic Surgery

## 2017-03-13 DIAGNOSIS — S838X1D Sprain of other specified parts of right knee, subsequent encounter: Secondary | ICD-10-CM

## 2017-03-13 DIAGNOSIS — M25461 Effusion, right knee: Secondary | ICD-10-CM | POA: Diagnosis not present

## 2017-03-13 DIAGNOSIS — S8391XA Sprain of unspecified site of right knee, initial encounter: Secondary | ICD-10-CM | POA: Insufficient documentation

## 2017-03-13 NOTE — Progress Notes (Signed)
The patient is following up after MRI of her right knee. She had fallen and slipped twisting that knee and she can with a large knee joint effusion that was drained. We sent her for an MRI due to the fusion of her knee that was a bloody effusion. She's been in a knee brace and [place] a crutch. She said the knee is been swollen very painful. [place] examination of the knee her range of motion is limited by effusion and she does have some pain. Her Lockman's and Mars exam are negative but she hurts significantly medial over the medial collateral ligament medial to the patella. She is neurovascular intact her calf is soft.  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 The patella does not subluxate on my exam. But is definitely tender to the lateral aspect of the patella. The MRI shows intact cartilage throughout the knee. The medial and lateral meniscus and anterior cruciate ligament PCL as well as medial and lateral collateral ligaments are intact.  Fortunately is something we can rehabilitation her through with time. She is already in a knee brace to protect her patella. She should continue to wear this brace and work on quad training exercises. I like to reevaluate her in 4 weeks.

## 2017-04-04 ENCOUNTER — Other Ambulatory Visit (INDEPENDENT_AMBULATORY_CARE_PROVIDER_SITE_OTHER): Payer: Self-pay

## 2017-04-04 ENCOUNTER — Telehealth (INDEPENDENT_AMBULATORY_CARE_PROVIDER_SITE_OTHER): Payer: Self-pay | Admitting: Orthopaedic Surgery

## 2017-04-04 MED ORDER — IBUPROFEN 800 MG PO TABS: 800.0000 mg | ORAL_TABLET | Freq: Three times a day (TID) | ORAL | 0 refills | Status: DC | PRN

## 2017-04-04 NOTE — Telephone Encounter (Signed)
Patient called needing Rx refilled (Ibuprofen) The number to contact patient is [phone]

## 2017-04-04 NOTE — Telephone Encounter (Signed)
I was going to refill this, when I did- something popped up with a "high" interaction with her lithium.

## 2017-04-04 NOTE — Telephone Encounter (Signed)
That is still ok to refill

## 2017-04-10 ENCOUNTER — Ambulatory Visit (INDEPENDENT_AMBULATORY_CARE_PROVIDER_SITE_OTHER): Payer: Medicare Other | Admitting: Orthopaedic Surgery

## 2017-04-10 DIAGNOSIS — M25461 Effusion, right knee: Secondary | ICD-10-CM

## 2017-04-10 DIAGNOSIS — S838X1D Sprain of other specified parts of right knee, subsequent encounter: Secondary | ICD-10-CM

## 2017-04-10 NOTE — Progress Notes (Signed)
The patient is continue to follow-up for her right knee. We had MRI in late April showing a tear that was partial the medial patellofemoral retinacular ligament. She still ambulating with a brace since it is still has a burning pain and painful and she says she does need the brace.  On examination of her knee there is still a mild effusion of her knee. Her knee does not subluxate in terms of the patella and she's got good range of motion but is deathly painful to her.  I think she should continue the knee brace. Also needs to work on quad strengthening exercises and just take ibuprofen for inflammation and pain. I gave her prescription for trying a cane in her opposite hand.  We'll see her back in a month to see how she doing overall may consider steroid injection in her knee at that visit if needed.

## 2017-04-17 DIAGNOSIS — E1140 Type 2 diabetes mellitus with diabetic neuropathy, unspecified: Secondary | ICD-10-CM | POA: Diagnosis not present

## 2017-04-17 DIAGNOSIS — E119 Type 2 diabetes mellitus without complications: Secondary | ICD-10-CM | POA: Diagnosis not present

## 2017-04-17 DIAGNOSIS — J45909 Unspecified asthma, uncomplicated: Secondary | ICD-10-CM | POA: Diagnosis not present

## 2017-04-17 DIAGNOSIS — E785 Hyperlipidemia, unspecified: Secondary | ICD-10-CM | POA: Diagnosis not present

## 2017-04-17 DIAGNOSIS — K219 Gastro-esophageal reflux disease without esophagitis: Secondary | ICD-10-CM | POA: Diagnosis not present

## 2017-04-17 DIAGNOSIS — M1990 Unspecified osteoarthritis, unspecified site: Secondary | ICD-10-CM | POA: Diagnosis not present

## 2017-04-17 DIAGNOSIS — I10 Essential (primary) hypertension: Secondary | ICD-10-CM | POA: Diagnosis not present

## 2017-04-17 DIAGNOSIS — Z87891 Personal history of nicotine dependence: Secondary | ICD-10-CM | POA: Diagnosis not present

## 2017-04-18 DIAGNOSIS — R1013 Epigastric pain: Secondary | ICD-10-CM | POA: Diagnosis not present

## 2017-04-18 DIAGNOSIS — E119 Type 2 diabetes mellitus without complications: Secondary | ICD-10-CM | POA: Diagnosis not present

## 2017-04-21 ENCOUNTER — Other Ambulatory Visit: Payer: Self-pay | Admitting: Internal Medicine

## 2017-04-21 DIAGNOSIS — Z1231 Encounter for screening mammogram for malignant neoplasm of breast: Secondary | ICD-10-CM

## 2017-04-22 DIAGNOSIS — I10 Essential (primary) hypertension: Secondary | ICD-10-CM | POA: Diagnosis not present

## 2017-04-22 DIAGNOSIS — E119 Type 2 diabetes mellitus without complications: Secondary | ICD-10-CM | POA: Diagnosis not present

## 2017-04-22 DIAGNOSIS — E1140 Type 2 diabetes mellitus with diabetic neuropathy, unspecified: Secondary | ICD-10-CM | POA: Diagnosis not present

## 2017-04-22 DIAGNOSIS — E785 Hyperlipidemia, unspecified: Secondary | ICD-10-CM | POA: Diagnosis not present

## 2017-04-22 DIAGNOSIS — Z87891 Personal history of nicotine dependence: Secondary | ICD-10-CM | POA: Diagnosis not present

## 2017-04-22 DIAGNOSIS — M1990 Unspecified osteoarthritis, unspecified site: Secondary | ICD-10-CM | POA: Diagnosis not present

## 2017-04-22 DIAGNOSIS — J45909 Unspecified asthma, uncomplicated: Secondary | ICD-10-CM | POA: Diagnosis not present

## 2017-04-22 DIAGNOSIS — H538 Other visual disturbances: Secondary | ICD-10-CM | POA: Diagnosis not present

## 2017-04-22 DIAGNOSIS — Z01118 Encounter for examination of ears and hearing with other abnormal findings: Secondary | ICD-10-CM | POA: Diagnosis not present

## 2017-04-22 DIAGNOSIS — K219 Gastro-esophageal reflux disease without esophagitis: Secondary | ICD-10-CM | POA: Diagnosis not present

## 2017-04-30 DIAGNOSIS — E785 Hyperlipidemia, unspecified: Secondary | ICD-10-CM | POA: Diagnosis not present

## 2017-04-30 DIAGNOSIS — I10 Essential (primary) hypertension: Secondary | ICD-10-CM | POA: Diagnosis not present

## 2017-04-30 DIAGNOSIS — E119 Type 2 diabetes mellitus without complications: Secondary | ICD-10-CM | POA: Diagnosis not present

## 2017-05-08 ENCOUNTER — Ambulatory Visit (INDEPENDENT_AMBULATORY_CARE_PROVIDER_SITE_OTHER): Payer: Medicare Other | Admitting: Orthopaedic Surgery

## 2017-05-08 DIAGNOSIS — S83411D Sprain of medial collateral ligament of right knee, subsequent encounter: Secondary | ICD-10-CM

## 2017-05-08 DIAGNOSIS — M25461 Effusion, right knee: Secondary | ICD-10-CM | POA: Diagnosis not present

## 2017-05-08 MED ORDER — METHYLPREDNISOLONE ACETATE 40 MG/ML IJ SUSP
40.0000 mg | INTRAMUSCULAR | Status: AC | PRN
  Administered 2017-05-08: 40 mg via INTRA_ARTICULAR

## 2017-05-08 MED ORDER — LIDOCAINE HCL 1 % IJ SOLN
3.0000 mL | INTRAMUSCULAR | Status: AC | PRN
  Administered 2017-05-08: 3 mL

## 2017-05-08 NOTE — Progress Notes (Signed)
   Procedure Note  Patient: Melissa Pham             Date of Birth: [birth year]           MRN: [id number]             Visit Date: 05/08/2017  Procedures: Visit Diagnoses: No diagnosis found.  Large Joint Inj Date/Time: 05/08/2017 2:52 PM Performed by: Mcarthur Rossetti Authorized by: Mcarthur Rossetti   Location:  Knee Site:  R knee Ultrasound Guidance: No   Fluoroscopic Guidance: No   Arthrogram: No   Medications:  3 mL lidocaine 1 %; 40 mg methylPREDNISolone acetate 40 MG/ML   The patient continues to improve with a right knee pain. Her x-ray and MRI shows mainly patellofemoral arthritic changes. She also had some sprain of the medial compartment of her knee. On exam the knee is improved in terms range of motion. There is no ligamentous instability. There is no effusion either. I feel that injection would help her and continue knee brace wear as well as strengthening her knee. She understands all this and all questions were encouraged and answered. At this point she'll follow-up as needed.

## 2017-06-02 ENCOUNTER — Ambulatory Visit: Payer: Medicare Other

## 2017-06-16 DIAGNOSIS — F314 Bipolar disorder, current episode depressed, severe, without psychotic features: Secondary | ICD-10-CM | POA: Diagnosis not present

## 2017-07-23 ENCOUNTER — Ambulatory Visit: Payer: Medicare Other

## 2017-07-30 DIAGNOSIS — F314 Bipolar disorder, current episode depressed, severe, without psychotic features: Secondary | ICD-10-CM | POA: Diagnosis not present

## 2017-07-31 DIAGNOSIS — E1140 Type 2 diabetes mellitus with diabetic neuropathy, unspecified: Secondary | ICD-10-CM | POA: Diagnosis not present

## 2017-07-31 DIAGNOSIS — Z0000 Encounter for general adult medical examination without abnormal findings: Secondary | ICD-10-CM | POA: Diagnosis not present

## 2017-07-31 DIAGNOSIS — K219 Gastro-esophageal reflux disease without esophagitis: Secondary | ICD-10-CM | POA: Diagnosis not present

## 2017-07-31 DIAGNOSIS — J45909 Unspecified asthma, uncomplicated: Secondary | ICD-10-CM | POA: Diagnosis not present

## 2017-07-31 DIAGNOSIS — E785 Hyperlipidemia, unspecified: Secondary | ICD-10-CM | POA: Diagnosis not present

## 2017-07-31 DIAGNOSIS — M1990 Unspecified osteoarthritis, unspecified site: Secondary | ICD-10-CM | POA: Diagnosis not present

## 2017-07-31 DIAGNOSIS — Z87891 Personal history of nicotine dependence: Secondary | ICD-10-CM | POA: Diagnosis not present

## 2017-07-31 DIAGNOSIS — I10 Essential (primary) hypertension: Secondary | ICD-10-CM | POA: Diagnosis not present

## 2017-07-31 DIAGNOSIS — E119 Type 2 diabetes mellitus without complications: Secondary | ICD-10-CM | POA: Diagnosis not present

## 2017-08-19 ENCOUNTER — Ambulatory Visit: Payer: Medicare Other

## 2017-08-22 ENCOUNTER — Ambulatory Visit
Admission: RE | Admit: 2017-08-22 | Discharge: 2017-08-22 | Disposition: A | Discharge status: Home / Self Care | Payer: Medicare Other | Source: Ambulatory Visit | Attending: Internal Medicine | Admitting: Internal Medicine

## 2017-08-22 DIAGNOSIS — R0609 Other forms of dyspnea: Secondary | ICD-10-CM | POA: Diagnosis not present

## 2017-08-22 DIAGNOSIS — Z1231 Encounter for screening mammogram for malignant neoplasm of breast: Secondary | ICD-10-CM | POA: Diagnosis not present

## 2017-08-22 DIAGNOSIS — I10 Essential (primary) hypertension: Secondary | ICD-10-CM | POA: Diagnosis not present

## 2017-08-25 DIAGNOSIS — F314 Bipolar disorder, current episode depressed, severe, without psychotic features: Secondary | ICD-10-CM | POA: Diagnosis not present

## 2017-12-10 ENCOUNTER — Encounter (HOSPITAL_COMMUNITY): Payer: Self-pay

## 2017-12-10 ENCOUNTER — Inpatient Hospital Stay (HOSPITAL_COMMUNITY)
Admission: EM | Admit: 2017-12-10 | Discharge: 2017-12-13 | DRG: 313 | Disposition: A | Discharge status: Home / Self Care | Payer: Medicare Other | Attending: Internal Medicine | Admitting: Internal Medicine

## 2017-12-10 ENCOUNTER — Emergency Department (HOSPITAL_COMMUNITY): Payer: Medicare Other

## 2017-12-10 DIAGNOSIS — Z23 Encounter for immunization: Secondary | ICD-10-CM

## 2017-12-10 DIAGNOSIS — F319 Bipolar disorder, unspecified: Secondary | ICD-10-CM | POA: Diagnosis not present

## 2017-12-10 DIAGNOSIS — E119 Type 2 diabetes mellitus without complications: Secondary | ICD-10-CM | POA: Diagnosis not present

## 2017-12-10 DIAGNOSIS — R0789 Other chest pain: Secondary | ICD-10-CM | POA: Diagnosis not present

## 2017-12-10 DIAGNOSIS — K219 Gastro-esophageal reflux disease without esophagitis: Secondary | ICD-10-CM | POA: Diagnosis present

## 2017-12-10 DIAGNOSIS — Z882 Allergy status to sulfonamides status: Secondary | ICD-10-CM

## 2017-12-10 DIAGNOSIS — J45909 Unspecified asthma, uncomplicated: Secondary | ICD-10-CM | POA: Diagnosis present

## 2017-12-10 DIAGNOSIS — Z8249 Family history of ischemic heart disease and other diseases of the circulatory system: Secondary | ICD-10-CM

## 2017-12-10 DIAGNOSIS — R109 Unspecified abdominal pain: Secondary | ICD-10-CM

## 2017-12-10 DIAGNOSIS — F17210 Nicotine dependence, cigarettes, uncomplicated: Secondary | ICD-10-CM | POA: Diagnosis present

## 2017-12-10 DIAGNOSIS — I10 Essential (primary) hypertension: Secondary | ICD-10-CM | POA: Diagnosis present

## 2017-12-10 DIAGNOSIS — E876 Hypokalemia: Secondary | ICD-10-CM | POA: Diagnosis present

## 2017-12-10 DIAGNOSIS — Z79899 Other long term (current) drug therapy: Secondary | ICD-10-CM

## 2017-12-10 DIAGNOSIS — R0602 Shortness of breath: Secondary | ICD-10-CM | POA: Diagnosis not present

## 2017-12-10 DIAGNOSIS — R1084 Generalized abdominal pain: Secondary | ICD-10-CM | POA: Diagnosis present

## 2017-12-10 DIAGNOSIS — Z6833 Body mass index (BMI) 33.0-33.9, adult: Secondary | ICD-10-CM

## 2017-12-10 DIAGNOSIS — E785 Hyperlipidemia, unspecified: Secondary | ICD-10-CM | POA: Diagnosis present

## 2017-12-10 DIAGNOSIS — Z8349 Family history of other endocrine, nutritional and metabolic diseases: Secondary | ICD-10-CM

## 2017-12-10 DIAGNOSIS — R079 Chest pain, unspecified: Secondary | ICD-10-CM | POA: Diagnosis not present

## 2017-12-10 DIAGNOSIS — E7800 Pure hypercholesterolemia, unspecified: Secondary | ICD-10-CM | POA: Diagnosis present

## 2017-12-10 DIAGNOSIS — R61 Generalized hyperhidrosis: Secondary | ICD-10-CM | POA: Diagnosis not present

## 2017-12-10 DIAGNOSIS — R197 Diarrhea, unspecified: Secondary | ICD-10-CM | POA: Diagnosis present

## 2017-12-10 DIAGNOSIS — F209 Schizophrenia, unspecified: Secondary | ICD-10-CM | POA: Diagnosis present

## 2017-12-10 DIAGNOSIS — Z7982 Long term (current) use of aspirin: Secondary | ICD-10-CM

## 2017-12-10 DIAGNOSIS — R112 Nausea with vomiting, unspecified: Secondary | ICD-10-CM | POA: Diagnosis not present

## 2017-12-10 DIAGNOSIS — E669 Obesity, unspecified: Secondary | ICD-10-CM | POA: Diagnosis present

## 2017-12-10 LAB — URINALYSIS, ROUTINE W REFLEX MICROSCOPIC
BACTERIA UA: NONE SEEN
BILIRUBIN URINE: NEGATIVE
Glucose, UA: NEGATIVE mg/dL
KETONES UR: NEGATIVE mg/dL
LEUKOCYTES UA: NEGATIVE
Nitrite: NEGATIVE
PROTEIN: NEGATIVE mg/dL
Specific Gravity, Urine: 1.014 (ref 1.005–1.030)
pH: 5 (ref 5.0–8.0)

## 2017-12-10 LAB — COMPREHENSIVE METABOLIC PANEL
ALT: 29 U/L (ref 14–54)
AST: 30 U/L (ref 15–41)
Albumin: 3.5 g/dL (ref 3.5–5.0)
Alkaline Phosphatase: 73 U/L (ref 38–126)
Anion gap: 12 (ref 5–15)
BUN: 11 mg/dL (ref 6–20)
CHLORIDE: 110 mmol/L (ref 101–111)
CO2: 20 mmol/L — AB (ref 22–32)
CREATININE: 0.6 mg/dL (ref 0.44–1.00)
Calcium: 9.2 mg/dL (ref 8.9–10.3)
GFR calc Af Amer: >60 mL/min (ref >60)
GFR calc non Af Amer: >60 mL/min (ref >60)
Glucose, Bld: 102 mg/dL — ABNORMAL HIGH (ref 65–99)
POTASSIUM: 3.9 mmol/L (ref 3.5–5.1)
SODIUM: 142 mmol/L (ref 135–145)
Total Bilirubin: 0.7 mg/dL (ref 0.3–1.2)
Total Protein: 6.7 g/dL (ref 6.5–8.1)

## 2017-12-10 LAB — CBC
HEMATOCRIT: 36.5 % (ref 36.0–46.0)
HEMOGLOBIN: 12.5 g/dL (ref 12.0–15.0)
MCH: 29.6 pg (ref 26.0–34.0)
MCHC: 34.2 g/dL (ref 30.0–36.0)
MCV: 86.3 fL (ref 78.0–100.0)
PLATELETS: 289 10*3/uL (ref 150–400)
RBC: 4.23 MIL/uL (ref 3.87–5.11)
RDW: 14.3 % (ref 11.5–15.5)
WBC: 5.2 10*3/uL (ref 4.0–10.5)

## 2017-12-10 LAB — I-STAT BETA HCG BLOOD, ED (MC, WL, AP ONLY): I-stat hCG, quantitative: <5 m[IU]/mL (ref <5)

## 2017-12-10 LAB — I-STAT TROPONIN, ED: TROPONIN I, POC: 0 ng/mL (ref 0.00–0.08)

## 2017-12-10 LAB — LIPASE, BLOOD: LIPASE: 29 U/L (ref 11–51)

## 2017-12-10 MED ORDER — ACETAMINOPHEN 325 MG PO TABS: 650.0000 mg | ORAL_TABLET | ORAL | Status: DC | PRN

## 2017-12-10 MED ORDER — GI COCKTAIL ~~LOC~~: 30.0000 mL | Freq: Three times a day (TID) | ORAL | Status: DC | PRN

## 2017-12-10 MED ORDER — SIMVASTATIN 40 MG PO TABS
40.0000 mg | ORAL_TABLET | Freq: Every day | ORAL | Status: DC
  Administered 2017-12-10 – 2017-12-12 (×3): 40 mg via ORAL
  Filled 2017-12-10 (×4): qty 1

## 2017-12-10 MED ORDER — ASPIRIN EC 81 MG PO TBEC
81.0000 mg | DELAYED_RELEASE_TABLET | Freq: Every day | ORAL | Status: DC
  Administered 2017-12-11 – 2017-12-13 (×3): 81 mg via ORAL
  Filled 2017-12-10 (×3): qty 1

## 2017-12-10 MED ORDER — GABAPENTIN 100 MG PO CAPS
100.0000 mg | ORAL_CAPSULE | Freq: Three times a day (TID) | ORAL | Status: DC
  Administered 2017-12-10 – 2017-12-13 (×8): 100 mg via ORAL
  Filled 2017-12-10 (×8): qty 1

## 2017-12-10 MED ORDER — VALACYCLOVIR HCL 500 MG PO TABS
1000.0000 mg | ORAL_TABLET | Freq: Every day | ORAL | Status: DC
  Administered 2017-12-10 – 2017-12-13 (×4): 1000 mg via ORAL
  Filled 2017-12-10 (×4): qty 2

## 2017-12-10 MED ORDER — NITROGLYCERIN 0.4 MG SL SUBL: 0.4000 mg | SUBLINGUAL_TABLET | SUBLINGUAL | Status: DC | PRN

## 2017-12-10 MED ORDER — ADULT MULTIVITAMIN W/MINERALS CH
1.0000 | ORAL_TABLET | Freq: Every day | ORAL | Status: DC
  Administered 2017-12-10 – 2017-12-13 (×4): 1 via ORAL
  Filled 2017-12-10 (×4): qty 1

## 2017-12-10 MED ORDER — NITROGLYCERIN 0.4 MG SL SUBL
0.4000 mg | SUBLINGUAL_TABLET | SUBLINGUAL | Status: DC | PRN
  Administered 2017-12-10 (×3): 0.4 mg via SUBLINGUAL
  Filled 2017-12-10: qty 1

## 2017-12-10 MED ORDER — ONDANSETRON HCL 4 MG/2ML IJ SOLN: 4.0000 mg | Freq: Four times a day (QID) | INTRAMUSCULAR | Status: DC | PRN

## 2017-12-10 MED ORDER — PANTOPRAZOLE SODIUM 40 MG PO TBEC
40.0000 mg | DELAYED_RELEASE_TABLET | Freq: Every day | ORAL | Status: DC
  Administered 2017-12-10 – 2017-12-13 (×4): 40 mg via ORAL
  Filled 2017-12-10 (×4): qty 1

## 2017-12-10 MED ORDER — METOPROLOL SUCCINATE ER 50 MG PO TB24
50.0000 mg | ORAL_TABLET | Freq: Every day | ORAL | Status: DC
  Administered 2017-12-10 – 2017-12-13 (×4): 50 mg via ORAL
  Filled 2017-12-10 (×4): qty 1

## 2017-12-10 MED ORDER — ALBUTEROL SULFATE (2.5 MG/3ML) 0.083% IN NEBU
2.5000 mg | INHALATION_SOLUTION | Freq: Four times a day (QID) | RESPIRATORY_TRACT | Status: DC | PRN
  Administered 2017-12-12: 2.5 mg via RESPIRATORY_TRACT
  Filled 2017-12-10: qty 3

## 2017-12-10 MED ORDER — CYCLOBENZAPRINE HCL 10 MG PO TABS: 5.0000 mg | ORAL_TABLET | Freq: Two times a day (BID) | ORAL | Status: DC | PRN

## 2017-12-10 MED ORDER — KETOROLAC TROMETHAMINE 30 MG/ML IJ SOLN: 30.0000 mg | Freq: Four times a day (QID) | INTRAMUSCULAR | Status: DC | PRN

## 2017-12-10 MED ORDER — BENZTROPINE MESYLATE 1 MG PO TABS
1.0000 mg | ORAL_TABLET | Freq: Two times a day (BID) | ORAL | Status: DC
  Administered 2017-12-10 – 2017-12-13 (×6): 1 mg via ORAL
  Filled 2017-12-10 (×6): qty 1

## 2017-12-10 MED ORDER — FAMOTIDINE IN NACL 20-0.9 MG/50ML-% IV SOLN
20.0000 mg | Freq: Two times a day (BID) | INTRAVENOUS | Status: DC
  Administered 2017-12-10 – 2017-12-11 (×3): 20 mg via INTRAVENOUS
  Filled 2017-12-10 (×4): qty 50

## 2017-12-10 MED ORDER — LISINOPRIL 5 MG PO TABS
5.0000 mg | ORAL_TABLET | Freq: Every day | ORAL | Status: DC
  Administered 2017-12-11: 5 mg via ORAL
  Filled 2017-12-10: qty 2

## 2017-12-10 MED ORDER — SIMETHICONE 80 MG PO CHEW
80.0000 mg | CHEWABLE_TABLET | Freq: Four times a day (QID) | ORAL | Status: DC
  Administered 2017-12-10 – 2017-12-13 (×10): 80 mg via ORAL
  Filled 2017-12-10 (×12): qty 1

## 2017-12-10 MED ORDER — TRAZODONE HCL 50 MG PO TABS
100.0000 mg | ORAL_TABLET | Freq: Every evening | ORAL | Status: DC | PRN
  Administered 2017-12-11: 100 mg via ORAL
  Filled 2017-12-10: qty 2

## 2017-12-10 MED ORDER — MORPHINE SULFATE (PF) 4 MG/ML IV SOLN
4.0000 mg | Freq: Once | INTRAVENOUS | Status: AC | PRN
  Administered 2017-12-10: 4 mg via INTRAVENOUS
  Filled 2017-12-10: qty 1

## 2017-12-10 MED ORDER — ENOXAPARIN SODIUM 40 MG/0.4ML ~~LOC~~ SOLN
40.0000 mg | SUBCUTANEOUS | Status: DC
  Administered 2017-12-10: 40 mg via SUBCUTANEOUS
  Filled 2017-12-10: qty 0.4

## 2017-12-10 MED ORDER — HYDROCODONE-ACETAMINOPHEN 5-325 MG PO TABS
1.0000 | ORAL_TABLET | ORAL | Status: DC | PRN
  Administered 2017-12-11: 2 via ORAL
  Administered 2017-12-11: 1 via ORAL
  Administered 2017-12-12: 2 via ORAL
  Filled 2017-12-10: qty 2
  Filled 2017-12-10: qty 1
  Filled 2017-12-10: qty 2

## 2017-12-10 MED ORDER — RISPERIDONE 2 MG PO TABS
2.0000 mg | ORAL_TABLET | Freq: Two times a day (BID) | ORAL | Status: DC
  Administered 2017-12-10 – 2017-12-13 (×6): 2 mg via ORAL
  Filled 2017-12-10 (×6): qty 1

## 2017-12-10 MED ORDER — HYDROXYZINE HCL 25 MG PO TABS: 25.0000 mg | ORAL_TABLET | Freq: Four times a day (QID) | ORAL | Status: DC | PRN

## 2017-12-10 MED ORDER — ASPIRIN 81 MG PO CHEW
324.0000 mg | CHEWABLE_TABLET | Freq: Once | ORAL | Status: AC
  Administered 2017-12-10: 324 mg via ORAL
  Filled 2017-12-10: qty 4
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
## 2017-12-10 NOTE — ED Notes (Signed)
Pt remains in waiting room. Updated on wait for treatment room. 

## 2017-12-10 NOTE — H&P (Signed)
History and Physical    Melissa Pham BZJ:[id number] DOB: [birth year] DOA: 12/10/2017  PCP: Benito Mccreedy, MD   Patient coming from: Home  Chief Complaint: Abd discomfort, flatulence, loose stool, chest pain   HPI: Melissa Pham is a 52 y.o. female with medical history significant for bipolar disorder, hypertension, diet-controlled diabetes, GERD, and hyperlipidemia, now presenting to the emergency department for evaluation of abdominal discomfort with cramping, loose stools, and flatulence.  She also reported chest pain while being evaluated in the ED.  Patient reports that for the past several days, she has been experiencing increased cramping to the abdomen generally, associated with nonbloody loose stools and increased flatulence.  She has experienced some pressure sensation in the central chest associated with this.  Chest pressure is described as localized, moderate to severe in intensity, waxing and waning, no appreciable alleviating or exacerbating factors identified, and associated with shortness of breath.  She reports recent upper respiratory illness with sore throat and cough, but reports that this is nearly resolved now.  No fevers or chills.  ED Course: Upon arrival to the ED, patient is found to be afebrile, saturating well on room air, and with otherwise normal.  EKG features a normal sinus rhythm and chest x-ray is negative for acute cardiopulmonary disease.  Chemistry panel and CBC are unremarkable, troponin is undetectable, and urinalysis is unremarkable.  Patient was treated with 324 mg of aspirin and 4 mg IV morphine in the ED.  She remains hemodynamically stable, in no apparent respiratory distress, and will be admitted to the telemetry unit for ongoing evaluation and management of chest pressure in the setting of GI discomfort.  Review of Systems:  All other systems reviewed and apart from HPI, are negative.  Past Medical History:  Diagnosis Date  . ADHD (attention  deficit hyperactivity disorder)   . Anxiety   . Asthma   . Bipolar disorder ([place])   . Blood transfusion without reported diagnosis 1985   after childbirth  . Depression   . Diabetes mellitus without complication ([place])   . GERD (gastroesophageal reflux disease)   . Hyperlipidemia   . Hypertension   . Schizophrenia ([place])   . Sickle cell trait [facility])     Past Surgical History:  Procedure Laterality Date  . CERVICAL BIOPSY  W/ LOOP ELECTRODE EXCISION    . left eye surgery  1970  . TUBAL LIGATION  1989     reports that she has been smoking cigarettes.  She has a 1.00 pack-year smoking history. she has never used smokeless tobacco. She reports that she does not drink alcohol or use drugs.  Allergies  Allergen Reactions  . Sulfa Antibiotics Anaphylaxis    Family History  Problem Relation Age of Onset  . Hyperlipidemia Mother   . Arthritis Mother   . Heart attack Father   . Diabetes Father   . Hypertension Father   . Breast cancer Sister   . Cancer Sister        cervical  . Breast cancer Paternal Aunt   . Colon cancer Maternal Grandmother 74  . Esophageal cancer Neg Hx   . Rectal cancer Neg Hx      Prior to Admission medications   Medication Sig Start Date End Date Taking? Authorizing Provider  acetaminophen (TYLENOL) 500 MG tablet Take 1,000 mg by mouth every 6 (six) hours as needed for mild pain.   Yes [provider]  albuterol (PROVENTIL HFA;VENTOLIN HFA) 108 (90 BASE) MCG/ACT inhaler Inhale 2 puffs into  the lungs every 6 (six) hours as needed for wheezing or shortness of breath.    Yes [provider]  albuterol (PROVENTIL) (2.5 MG/3ML) 0.083% nebulizer solution Take 2.5 mg by nebulization every 6 (six) hours as needed for wheezing or shortness of breath.   Yes [provider]  aspirin EC 81 MG tablet Take 81 mg by mouth daily.   Yes [provider]  benztropine (COGENTIN) 1 MG tablet Take 1 mg by mouth 2 (two) times daily.   Yes  [provider]  cyclobenzaprine (FLEXERIL) 5 MG tablet Take 5 mg by mouth 2 (two) times daily as needed for muscle spasms.    Yes [provider]  gabapentin (NEURONTIN) 600 MG tablet Take 100 mg by mouth 3 (three) times daily.    Yes [provider]  hydrOXYzine (ATARAX/VISTARIL) 25 MG tablet Take 25 mg by mouth every 6 (six) hours as needed for itching.    Yes [provider]  ibuprofen (ADVIL,MOTRIN) 800 MG tablet Take 1 tablet (800 mg total) by mouth every 8 (eight) hours as needed. 04/04/17  Yes Mcarthur Rossetti, MD  Lidocaine-Glycerin (PREPARATION H RE) Place 1 application rectally as needed (hemmroids).   Yes [provider]  lisinopril (PRINIVIL,ZESTRIL) 5 MG tablet Take 5 mg by mouth daily.   Yes [provider]  metoprolol succinate (TOPROL-XL) 50 MG 24 hr tablet Take 50 mg by mouth daily. Take with or immediately following a meal.   Yes [provider]  mometasone (NASONEX) 50 MCG/ACT nasal spray Place 2 sprays into the nose 2 (two) times daily as needed (allergies).    Yes [provider]  Multiple Vitamins-Minerals (MULTIVITAMIN WITH MINERALS) tablet Take 1 tablet by mouth daily.   Yes [provider]  nitroGLYCERIN (NITROSTAT) 0.4 MG SL tablet Place 0.4 mg under the tongue every 5 (five) minutes as needed for chest pain.   Yes [provider]  omeprazole (PRILOSEC) 20 MG capsule Take 2 capsules (40 mg total) by mouth daily. 02/21/15  Yes Hosie Poisson, MD  PARoxetine (PAXIL) 20 MG tablet Take 20 mg by mouth daily as needed (hotflashes).    Yes [provider]  Pseudoeph-Doxylamine-DM-APAP (NYQUIL PO) Take 2 capsules by mouth as needed (cold symptoms).   Yes [provider]  Pseudoephedrine-APAP-DM (DAYQUIL PO) Take 2 capsules by mouth as needed (cold symptoms).   Yes [provider]  risperiDONE (RISPERDAL) 2 MG tablet Take 2 mg by mouth 2 (two) times daily.   Yes  [provider]  simethicone (MYLICON) 80 MG chewable tablet Chew 80 mg by mouth 4 (four) times daily as needed for flatulence.    Yes [provider]  simvastatin (ZOCOR) 40 MG tablet Take 40 mg by mouth daily.   Yes [provider]  traZODone (DESYREL) 100 MG tablet Take 100-200 mg by mouth at bedtime as needed for sleep.    Yes [provider]  valACYclovir (VALTREX) 1000 MG tablet Take 1,000 mg by mouth daily.   Yes [provider]    Physical Exam: Vitals:   12/10/17 1111 12/10/17 1719 12/10/17 2048  BP: 140/81 (!) 142/85 (!) 139/91  Pulse: 82 86 68  Resp: 18 17 (!) 27  Temp: 98.6 F (37 C)    SpO2: 100% 100% 98%      Constitutional: NAD, calm, obese Eyes: PERTLA, lids and conjunctivae normal ENMT: Mucous membranes are moist. Posterior pharynx clear of any exudate or lesions.   Neck: normal, supple,  no masses, no thyromegaly Respiratory: clear to auscultation bilaterally, no wheezing, no crackles. Normal respiratory effort. No accessory muscle use.  Cardiovascular: S1 & S2 heard, regular rate and rhythm. No significant JVD. Abdomen: No distension, no tenderness, no masses palpated. Bowel sounds active.  Musculoskeletal: no clubbing / cyanosis. No joint deformity upper and lower extremities.   Skin: no significant rashes, lesions, ulcers. Warm, dry, well-perfused. Neurologic: CN 2-12 grossly intact. Sensation intact, DTR normal. Strength 5/5 in all 4 limbs.  Psychiatric:  Alert and oriented x 3. Calm, cooperative.     Labs on Admission: I have personally reviewed following labs and imaging studies  CBC: Recent Labs  Lab 12/10/17 1118  WBC 5.2  HGB 12.5  HCT 36.5  MCV 86.3  PLT 166   Basic Metabolic Panel: Recent Labs  Lab 12/10/17 1118  NA 142  K 3.9  CL 110  CO2 20*  GLUCOSE 102*  BUN 11  CREATININE 0.60  CALCIUM 9.2   GFR: CrCl cannot be calculated (Unknown ideal weight.). Liver Function Tests: Recent  Labs  Lab 12/10/17 1118  AST 30  ALT 29  ALKPHOS 73  BILITOT 0.7  PROT 6.7  ALBUMIN 3.5   Recent Labs  Lab 12/10/17 1118  LIPASE 29   No results for input(s): AMMONIA in the last 168 hours. Coagulation Profile: No results for input(s): INR, PROTIME in the last 168 hours. Cardiac Enzymes: No results for input(s): CKTOTAL, CKMB, CKMBINDEX, TROPONINI in the last 168 hours. BNP (last 3 results) No results for input(s): PROBNP in the last 8760 hours. HbA1C: No results for input(s): HGBA1C in the last 72 hours. CBG: No results for input(s): GLUCAP in the last 168 hours. Lipid Profile: No results for input(s): CHOL, HDL, LDLCALC, TRIG, CHOLHDL, LDLDIRECT in the last 72 hours. Thyroid Function Tests: No results for input(s): TSH, T4TOTAL, FREET4, T3FREE, THYROIDAB in the last 72 hours. Anemia Panel: No results for input(s): VITAMINB12, FOLATE, FERRITIN, TIBC, IRON, RETICCTPCT in the last 72 hours. Urine analysis:    Component Value Date/Time   COLORURINE YELLOW 12/10/2017 1112   APPEARANCEUR CLEAR 12/10/2017 1112   LABSPEC 1.014 12/10/2017 1112   PHURINE 5.0 12/10/2017 1112   GLUCOSEU NEGATIVE 12/10/2017 1112   HGBUR MODERATE (A) 12/10/2017 1112   BILIRUBINUR NEGATIVE 12/10/2017 1112   KETONESUR NEGATIVE 12/10/2017 1112   PROTEINUR NEGATIVE 12/10/2017 1112   UROBILINOGEN 0.2 02/20/2015 1300   NITRITE NEGATIVE 12/10/2017 1112   LEUKOCYTESUR NEGATIVE 12/10/2017 1112   Sepsis Labs: @LABRCNTIP (procalcitonin:4,lacticidven:4) )No results found for this or any previous visit (from the past 240 hour(s)).   Radiological Exams on Admission: Dg Chest 2 View  Result Date: 12/10/2017 CLINICAL DATA:  Chest pain EXAM: CHEST  2 VIEW COMPARISON:  10/09/2015 FINDINGS: The heart size and mediastinal contours are within normal limits. Both lungs are clear. The visualized skeletal structures are unremarkable. IMPRESSION: No active cardiopulmonary disease. Electronically Signed   By: Donavan Foil M.D.   On: 12/10/2017 20:05    EKG: Independently reviewed. Normal sinus rhythm.   Assessment/Plan  1. Chest pain  - Presents with abdominal discomfort, loose stools, and flatulence, later reported chest pain in ED  - Suspect this is GI-related, but she has risk-factors for CAD and admission was recommended  - Treated with ASA 324 mg in ED - Initial EKG and CXR unremarkable, troponin undetectable  - Continue cardiac monitoring, obtain serial troponin measurements, repeat EKG, continue ASA, lisinopril, metoprolol, and statin  - Treat possible GI-etiology with Pepcid,  sucralfate, and prn GI cocktail   2. Abdominal discomfort  - She describes chronic intermittent abdominal discomfort and currently experiencing increased flatulence and loose stools - Abdominal exam is benign and ED-workup reassuring  3. Hypertension  - BP at goal  - Continue lisinopril and metoprolol   4. Bipolar disorder  - Stable, continue Risperdal and trazodone     DVT prophylaxis: Lovenox Code Status: Full  Family Communication: Discussed with patient Disposition Plan: Observe on telemetry Consults called: None Admission status: Observation    Vianne Bulls, MD [facility] Pager [phone]  If 7PM-7AM, please contact night-coverage [email or web address] Password [email or web address]  12/10/2017, 10:18 PM

## 2017-12-10 NOTE — ED Provider Notes (Signed)
[place] EMERGENCY DEPARTMENT Provider Note   CSN: [id number] Arrival date & time: 12/10/17  1026     History   Chief Complaint Chief Complaint  Patient presents with  . Abdominal Pain  . Diarrhea    HPI Melissa Pham is a 52 y.o. female.  HPI   Melissa Pham is a 52 y.o. female, with a history of DM, asthma, anxiety, HTN, hyperlipidemia, and schizophrenia, presenting to the ED with chest pain.  Beginning February 1, patient began to have intermittent episodes of chest pain, "like a ton of bricks on my chest," central chest, 3/10-10/10, nonradiating.  Accompanied by shortness of breath, diaphoresis, neck pain, and flatulence.  She states she cannot say how long the episodes last.  However, she did take nitroglycerin, which seemed to help. Patient endorses significant increase in stress lately with the death of two close loved ones.  Patient is also had intermittent diarrhea and vomiting over the last 3 weeks, the onset of which coincides with the death of her significant other. Denies fever/chills, cough, current abdominal pain, urinary symptoms, peripheral edema, orthopnea, or any other complaints.   Past Medical History:  Diagnosis Date  . ADHD (attention deficit hyperactivity disorder)   . Anxiety   . Asthma   . Bipolar disorder ([place])   . Blood transfusion without reported diagnosis 1985   after childbirth  . Depression   . Diabetes mellitus without complication ([place])   . GERD (gastroesophageal reflux disease)   . Hyperlipidemia   . Hypertension   . Schizophrenia ([place])   . Sickle cell trait [facility])     Patient Active Problem List   Diagnosis Date Noted  . Effusion, right knee 03/13/2017  . Sprain of right knee 03/13/2017  . Chest pain 02/20/2015  . Suprapubic pain 02/20/2015  . Diarrhea 02/20/2015  . Diabetes mellitus, type 2 ([place]) 02/20/2015  . HTN (hypertension) 02/20/2015  . Dyslipidemia 02/20/2015  . Bipolar affective disorder ([place])  02/20/2015  . Schizophrenia ([place]) 02/20/2015  . Hypokalemia 02/20/2015    Past Surgical History:  Procedure Laterality Date  . CERVICAL BIOPSY  W/ LOOP ELECTRODE EXCISION    . left eye surgery  1970  . TUBAL LIGATION  1989    OB History    Gravida Para Term Preterm AB Living   7       3 4    SAB TAB Ectopic Multiple Live Births   1 2             Home Medications    Prior to Admission medications   Medication Sig Start Date End Date Taking? Authorizing Provider  acetaminophen (TYLENOL) 500 MG tablet Take 1,000 mg by mouth every 6 (six) hours as needed for mild pain.   Yes [provider]  albuterol (PROVENTIL HFA;VENTOLIN HFA) 108 (90 BASE) MCG/ACT inhaler Inhale 2 puffs into the lungs every 6 (six) hours as needed for wheezing or shortness of breath.    Yes [provider]  albuterol (PROVENTIL) (2.5 MG/3ML) 0.083% nebulizer solution Take 2.5 mg by nebulization every 6 (six) hours as needed for wheezing or shortness of breath.   Yes [provider]  aspirin EC 81 MG tablet Take 81 mg by mouth daily.   Yes [provider]  benztropine (COGENTIN) 1 MG tablet Take 1 mg by mouth 2 (two) times daily.   Yes [provider]  cyclobenzaprine (FLEXERIL) 5 MG tablet Take 5 mg by mouth 2 (two) times daily as needed for  muscle spasms.    Yes [provider]  gabapentin (NEURONTIN) 600 MG tablet Take 100 mg by mouth 3 (three) times daily.    Yes [provider]  hydrOXYzine (ATARAX/VISTARIL) 25 MG tablet Take 25 mg by mouth every 6 (six) hours as needed for itching.    Yes [provider]  ibuprofen (ADVIL,MOTRIN) 800 MG tablet Take 1 tablet (800 mg total) by mouth every 8 (eight) hours as needed. 04/04/17  Yes Mcarthur Rossetti, MD  Lidocaine-Glycerin (PREPARATION H RE) Place 1 application rectally as needed (hemmroids).   Yes [provider]  lisinopril (PRINIVIL,ZESTRIL) 5 MG tablet Take 5 mg by mouth daily.    Yes [provider]  metoprolol succinate (TOPROL-XL) 50 MG 24 hr tablet Take 50 mg by mouth daily. Take with or immediately following a meal.   Yes [provider]  mometasone (NASONEX) 50 MCG/ACT nasal spray Place 2 sprays into the nose 2 (two) times daily as needed (allergies).    Yes [provider]  Multiple Vitamins-Minerals (MULTIVITAMIN WITH MINERALS) tablet Take 1 tablet by mouth daily.   Yes [provider]  nitroGLYCERIN (NITROSTAT) 0.4 MG SL tablet Place 0.4 mg under the tongue every 5 (five) minutes as needed for chest pain.   Yes [provider]  omeprazole (PRILOSEC) 20 MG capsule Take 2 capsules (40 mg total) by mouth daily. 02/21/15  Yes Hosie Poisson, MD  PARoxetine (PAXIL) 20 MG tablet Take 20 mg by mouth daily as needed (hotflashes).    Yes [provider]  Pseudoeph-Doxylamine-DM-APAP (NYQUIL PO) Take 2 capsules by mouth as needed (cold symptoms).   Yes [provider]  Pseudoephedrine-APAP-DM (DAYQUIL PO) Take 2 capsules by mouth as needed (cold symptoms).   Yes [provider]  risperiDONE (RISPERDAL) 2 MG tablet Take 2 mg by mouth 2 (two) times daily.   Yes [provider]  simethicone (MYLICON) 80 MG chewable tablet Chew 80 mg by mouth 4 (four) times daily as needed for flatulence.    Yes [provider]  simvastatin (ZOCOR) 40 MG tablet Take 40 mg by mouth daily.   Yes [provider]  traZODone (DESYREL) 100 MG tablet Take 100-200 mg by mouth at bedtime as needed for sleep.    Yes [provider]  valACYclovir (VALTREX) 1000 MG tablet Take 1,000 mg by mouth daily.   Yes [provider]  ipratropium-albuterol (DUONEB) 0.5-2.5 (3) MG/3ML SOLN Take 3 mLs by nebulization every 6 (six) hours as needed. Patient not taking: Reported on 12/10/2017 02/21/15   Hosie Poisson, MD  nicotine (NICODERM CQ - DOSED IN MG/24 HOURS) 21 mg/24hr patch Place 1 patch (21 mg total)  onto the skin daily. Patient not taking: Reported on 07/13/2015 02/21/15   Hosie Poisson, MD    Family History Family History  Problem Relation Age of Onset  . Hyperlipidemia Mother   . Arthritis Mother   . Heart attack Father   . Diabetes Father   . Hypertension Father   . Breast cancer Sister   . Cancer Sister        cervical  . Breast cancer Paternal Aunt   . Colon cancer Maternal Grandmother 82  . Esophageal cancer Neg Hx   . Rectal cancer Neg Hx     Social History Social History   Tobacco Use  . Smoking status: Current Every Day Smoker    Packs/day: 0.25    Years: 4.00    Pack years: 1.00  Types: Cigarettes  . Smokeless tobacco: Never Used  Substance Use Topics  . Alcohol use: No    Alcohol/week: 0.0 oz  . Drug use: No    Comment: no drug use for 7 years     Allergies   Sulfa antibiotics   Review of Systems Review of Systems  Constitutional: Positive for diaphoresis. Negative for chills and fever.  Respiratory: Positive for shortness of breath. Negative for cough.   Cardiovascular: Positive for chest pain. Negative for leg swelling.  Gastrointestinal: Positive for diarrhea, nausea and vomiting. Negative for abdominal pain and blood in stool.  Neurological: Positive for dizziness. Negative for syncope and weakness.  All other systems reviewed and are negative.    Physical Exam Updated Vital Signs BP (!) 142/85   Pulse 86   Temp 98.6 F (37 C)   Resp 17   SpO2 100%   Physical Exam  Constitutional: She appears well-developed and well-nourished. No distress.  HENT:  Head: Normocephalic and atraumatic.  Eyes: Conjunctivae are normal.  Neck: Neck supple.  Cardiovascular: Normal rate, regular rhythm, normal heart sounds and intact distal pulses.  Pulmonary/Chest: Effort normal and breath sounds normal. No respiratory distress.  Abdominal: Soft. There is no tenderness. There is no guarding.  Musculoskeletal: She exhibits no edema.  Lymphadenopathy:     She has no cervical adenopathy.  Neurological: She is alert.  Skin: Skin is warm and dry. She is not diaphoretic.  Psychiatric: She has a normal mood and affect. Her behavior is normal.  Nursing note and vitals reviewed.    ED Treatments / Results  Labs (all labs ordered are listed, but only abnormal results are displayed) Labs Reviewed  COMPREHENSIVE METABOLIC PANEL - Abnormal; Notable for the following components:      Result Value   CO2 20 (*)    Glucose, Bld 102 (*)    All other components within normal limits  URINALYSIS, ROUTINE W REFLEX MICROSCOPIC - Abnormal; Notable for the following components:   Hgb urine dipstick MODERATE (*)    Squamous Epithelial / LPF 0-5 (*)    All other components within normal limits  LIPASE, BLOOD  CBC  I-STAT TROPONIN, ED  I-STAT BETA HCG BLOOD, ED ([facility], [facility], [facility] ONLY)    EKG  EKG Interpretation  Date/Time:  Wednesday December 10 2017 19:41:58 EST Ventricular Rate:  66 PR Interval:    QRS Duration: 74 QT Interval:  428 QTC Calculation: 449 R Axis:   68 Text Interpretation:  Sinus rhythm When compared to prior, no significant changes seen.  No STEMI Confirmed by Antony Blackbird [phone]) on 12/10/2017 9:34:01 PM       Radiology Dg Chest 2 View  Result Date: 12/10/2017 CLINICAL DATA:  Chest pain EXAM: CHEST  2 VIEW COMPARISON:  10/09/2015 FINDINGS: The heart size and mediastinal contours are within normal limits. Both lungs are clear. The visualized skeletal structures are unremarkable. IMPRESSION: No active cardiopulmonary disease. Electronically Signed   By: Donavan Foil M.D.   On: 12/10/2017 20:05    Procedures Procedures (including critical care time)  Medications Ordered in ED Medications  nitroGLYCERIN (NITROSTAT) SL tablet 0.4 mg (not administered)  morphine 4 MG/ML injection 4 mg (not administered)  aspirin chewable tablet 324 mg (324 mg Oral Given 12/10/17 1947)     Initial Impression / Assessment and Plan / ED Course    I have reviewed the triage vital signs and the nursing notes.  Pertinent labs & imaging results that were available during my  care of the patient were reviewed by me and considered in my medical decision making (see chart for details).  Clinical Course as of Dec 10 2142  Wed Dec 10, 2017  2131 Spoke with Dr. Myna Hidalgo, hospitalist. Agrees to admit the patient.   [SJ]    Clinical Course User Index [SJ] Rilley Stash C, PA-C    Patient presents with chest pain intermittent over the last few days.  Story is concerning for angina.  HEART score of 5.  No STEMI on EKG.  Initial troponin negative.  Low suspicion for PE; not tachycardic, no risk factors, chest pain not pleuritic.  Admission for chest pain rule out.  Findings and plan of care discussed with Antony Blackbird, MD.    Vitals:   12/10/17 1111 12/10/17 1719 12/10/17 2048  BP: 140/81 (!) 142/85 (!) 139/91  Pulse: 82 86 68  Resp: 18 17 (!) 27  Temp: 98.6 F (37 C)    SpO2: 100% 100% 98%      Final Clinical Impressions(s) / ED Diagnoses   Final diagnoses:  Chest pain, unspecified type    ED Discharge Orders    None       Layla Maw 12/10/17 2147    Tegeler, Gwenyth Allegra, MD 12/10/17 [phone]

## 2017-12-10 NOTE — ED Triage Notes (Signed)
Patient complains of abdominal cramping with diarrhea for several days. Also concerned with ongoing cough and congestion

## 2017-12-11 ENCOUNTER — Other Ambulatory Visit: Payer: Self-pay

## 2017-12-11 ENCOUNTER — Observation Stay (HOSPITAL_COMMUNITY): Payer: Medicare Other

## 2017-12-11 DIAGNOSIS — R0789 Other chest pain: Secondary | ICD-10-CM | POA: Diagnosis not present

## 2017-12-11 DIAGNOSIS — E7800 Pure hypercholesterolemia, unspecified: Secondary | ICD-10-CM | POA: Diagnosis not present

## 2017-12-11 DIAGNOSIS — E876 Hypokalemia: Secondary | ICD-10-CM | POA: Diagnosis not present

## 2017-12-11 DIAGNOSIS — I10 Essential (primary) hypertension: Secondary | ICD-10-CM | POA: Diagnosis not present

## 2017-12-11 DIAGNOSIS — Z7982 Long term (current) use of aspirin: Secondary | ICD-10-CM | POA: Diagnosis not present

## 2017-12-11 DIAGNOSIS — Z23 Encounter for immunization: Secondary | ICD-10-CM | POA: Diagnosis not present

## 2017-12-11 DIAGNOSIS — R197 Diarrhea, unspecified: Secondary | ICD-10-CM | POA: Diagnosis not present

## 2017-12-11 DIAGNOSIS — Z8349 Family history of other endocrine, nutritional and metabolic diseases: Secondary | ICD-10-CM | POA: Diagnosis not present

## 2017-12-11 DIAGNOSIS — E785 Hyperlipidemia, unspecified: Secondary | ICD-10-CM | POA: Diagnosis not present

## 2017-12-11 DIAGNOSIS — E669 Obesity, unspecified: Secondary | ICD-10-CM | POA: Diagnosis present

## 2017-12-11 DIAGNOSIS — Z79899 Other long term (current) drug therapy: Secondary | ICD-10-CM | POA: Diagnosis not present

## 2017-12-11 DIAGNOSIS — R079 Chest pain, unspecified: Secondary | ICD-10-CM | POA: Diagnosis not present

## 2017-12-11 DIAGNOSIS — Z8249 Family history of ischemic heart disease and other diseases of the circulatory system: Secondary | ICD-10-CM | POA: Diagnosis not present

## 2017-12-11 DIAGNOSIS — R1084 Generalized abdominal pain: Secondary | ICD-10-CM | POA: Diagnosis not present

## 2017-12-11 DIAGNOSIS — F17210 Nicotine dependence, cigarettes, uncomplicated: Secondary | ICD-10-CM | POA: Diagnosis present

## 2017-12-11 DIAGNOSIS — K219 Gastro-esophageal reflux disease without esophagitis: Secondary | ICD-10-CM | POA: Diagnosis not present

## 2017-12-11 DIAGNOSIS — J45909 Unspecified asthma, uncomplicated: Secondary | ICD-10-CM | POA: Diagnosis present

## 2017-12-11 DIAGNOSIS — Z6833 Body mass index (BMI) 33.0-33.9, adult: Secondary | ICD-10-CM | POA: Diagnosis not present

## 2017-12-11 DIAGNOSIS — Z882 Allergy status to sulfonamides status: Secondary | ICD-10-CM | POA: Diagnosis not present

## 2017-12-11 DIAGNOSIS — R109 Unspecified abdominal pain: Secondary | ICD-10-CM | POA: Diagnosis not present

## 2017-12-11 DIAGNOSIS — E119 Type 2 diabetes mellitus without complications: Secondary | ICD-10-CM

## 2017-12-11 DIAGNOSIS — F319 Bipolar disorder, unspecified: Secondary | ICD-10-CM | POA: Diagnosis not present

## 2017-12-11 DIAGNOSIS — F209 Schizophrenia, unspecified: Secondary | ICD-10-CM | POA: Diagnosis not present

## 2017-12-11 LAB — INFLUENZA PANEL BY PCR (TYPE A & B)
Influenza A By PCR: NEGATIVE
Influenza B By PCR: NEGATIVE

## 2017-12-11 LAB — HIV ANTIBODY (ROUTINE TESTING W REFLEX): HIV SCREEN 4TH GENERATION: NONREACTIVE

## 2017-12-11 LAB — BASIC METABOLIC PANEL
Anion gap: 10 (ref 5–15)
BUN: 10 mg/dL (ref 6–20)
CHLORIDE: 109 mmol/L (ref 101–111)
CO2: 25 mmol/L (ref 22–32)
Calcium: 8.5 mg/dL — ABNORMAL LOW (ref 8.9–10.3)
Creatinine, Ser: 0.61 mg/dL (ref 0.44–1.00)
GFR calc Af Amer: >60 mL/min (ref >60)
GFR calc non Af Amer: >60 mL/min (ref >60)
Glucose, Bld: 99 mg/dL (ref 65–99)
POTASSIUM: 3.1 mmol/L — AB (ref 3.5–5.1)
Sodium: 144 mmol/L (ref 135–145)

## 2017-12-11 LAB — TROPONIN I
Troponin I: <0.03 ng/mL (ref <0.03)
Troponin I: <0.03 ng/mL (ref <0.03)
Troponin I: <0.03 ng/mL (ref <0.03)

## 2017-12-11 LAB — GLUCOSE, CAPILLARY
GLUCOSE-CAPILLARY: 103 mg/dL — AB (ref 65–99)
Glucose-Capillary: 93 mg/dL (ref 65–99)

## 2017-12-11 LAB — ECHOCARDIOGRAM COMPLETE

## 2017-12-11 MED ORDER — POTASSIUM CHLORIDE CRYS ER 20 MEQ PO TBCR
40.0000 meq | EXTENDED_RELEASE_TABLET | Freq: Two times a day (BID) | ORAL | Status: AC
  Administered 2017-12-11: 40 meq via ORAL
  Filled 2017-12-11: qty 2

## 2017-12-11 NOTE — Progress Notes (Signed)
  Echocardiogram 2D Echocardiogram has been performed.  Jannett Celestine 12/11/2017, 12:04 PM

## 2017-12-11 NOTE — Progress Notes (Signed)
PROGRESS NOTE  Melissa Pham MWN:[id number] DOB: 20-Jan-1966 DOA: 12/10/2017 PCP: Benito Mccreedy, MD  HPI/Recap of past 24 hours: Melissa Pham is a 52 y.o. female with medical history significant for bipolar disorder, hypertension, diet-controlled diabetes, GERD, and hyperlipidemia, now presenting to the ED for evaluation of abdominal discomfort with cramping, loose stools, and flatulence. She also reported chest pain while being evaluated in the ED.  Patient reports that for the past several days, she has been experiencing increased cramping to the abdomen generally, associated with nonbloody loose stools and increased flatulence. Chest pressure is described as localized, moderate to severe in intensity, waxing and waning, no appreciable alleviating or exacerbating factors identified, and associated with shortness of breath. Pt reports a lot of stressors at home, with many recent deaths at home. She reports recent upper respiratory illness with sore throat and cough, but reports that this is nearly resolved now.  No fevers or chills. Pt admitted to the telemetry unit for ongoing evaluation and management of chest pressure in the setting of GI discomfort.  Today, pt noted to still be coughing although reports significant improvement compared to past week, denies any current chest pain, sob. Still reports mild generalized abdominal pain with bloating. Denies any N/V/D.  Assessment/Plan: Principal Problem:   Chest pain Active Problems:   Diabetes mellitus, type 2 (HCC)   HTN (hypertension)   Bipolar affective disorder (HCC)   Generalized abdominal discomfort  Chest pain Atypical  Trop X 3 neg, EKG with no acute ST changes ECHO with normal EF, no regional wall motion abnormalities Treated with ASA 324 mg in ED Continue cardiac monitoring Continue ASA, lisinopril, metoprolol, and statin   Abdominal discomfort Unclear etiology  Lipase, LFTs negative Abdominal Xray neg Continue  Pepcid, sucralfate, and prn GI cocktail   Hypertension  Stable  Continue lisinopril and metoprolol   Bipolar disorder  Continue Risperdal and trazodone    Hypokalemia Replace prn      Code Status: Full  Family Communication: None at bedside  Disposition Plan: Home likely 12/12/17   Consultants:  None  Procedures:  None   Antimicrobials:  None  DVT prophylaxis:  Lovenox   Objective: Vitals:   12/11/17 1006 12/11/17 1300 12/11/17 1401 12/11/17 1626  BP: 103/67  97/62 106/67  Pulse: (!) 59 65 62 67  Resp: (!) 31 19 16 18   Temp:   (!) 97.5 F (36.4 C) 97.7 F (36.5 C)  TempSrc:   Oral Oral  SpO2: 100% 99% 100% 100%  Weight:   82.1 kg (181 lb 1.6 oz)   Height:   5\' 2"  (1.575 m)     Intake/Output Summary (Last 24 hours) at 12/11/2017 1852 Last data filed at 12/11/2017 1845 Gross per 24 hour  Intake 222 ml  Output -  Net 222 ml   Filed Weights   12/11/17 1401  Weight: 82.1 kg (181 lb 1.6 oz)    Exam:   General:  Alert, awake, oriented, NAD   Cardiovascular: S1,S2 present, no added hrt sound  Respiratory: Chest clear bilaterally  Abdomen: Soft, non-tender, non-distended, BS present  Musculoskeletal: No pedal edema bilaterally  Skin: Normal  Psychiatry: Normal mood   Data Reviewed: CBC: Recent Labs  Lab 12/10/17 1118  WBC 5.2  HGB 12.5  HCT 36.5  MCV 86.3  PLT 403   Basic Metabolic Panel: Recent Labs  Lab 12/10/17 1118 12/11/17 0339  NA 142 144  K 3.9 3.1*  CL 110 109  CO2 20* 25  GLUCOSE  102* 99  BUN 11 10  CREATININE 0.60 0.61  CALCIUM 9.2 8.5*   GFR: Estimated Creatinine Clearance: 80.8 mL/min (by C-G formula based on SCr of 0.61 mg/dL). Liver Function Tests: Recent Labs  Lab 12/10/17 1118  AST 30  ALT 29  ALKPHOS 73  BILITOT 0.7  PROT 6.7  ALBUMIN 3.5   Recent Labs  Lab 12/10/17 1118  LIPASE 29   No results for input(s): AMMONIA in the last 168 hours. Coagulation Profile: No results for input(s):  INR, PROTIME in the last 168 hours. Cardiac Enzymes: Recent Labs  Lab 12/10/17 2318 12/11/17 0339 12/11/17 1355  TROPONINI <0.03 <0.03 <0.03   BNP (last 3 results) No results for input(s): PROBNP in the last 8760 hours. HbA1C: No results for input(s): HGBA1C in the last 72 hours. CBG: Recent Labs  Lab 12/11/17 1622  GLUCAP 93   Lipid Profile: No results for input(s): CHOL, HDL, LDLCALC, TRIG, CHOLHDL, LDLDIRECT in the last 72 hours. Thyroid Function Tests: No results for input(s): TSH, T4TOTAL, FREET4, T3FREE, THYROIDAB in the last 72 hours. Anemia Panel: No results for input(s): VITAMINB12, FOLATE, FERRITIN, TIBC, IRON, RETICCTPCT in the last 72 hours. Urine analysis:    Component Value Date/Time   COLORURINE YELLOW 12/10/2017 1112   APPEARANCEUR CLEAR 12/10/2017 1112   LABSPEC 1.014 12/10/2017 1112   PHURINE 5.0 12/10/2017 1112   GLUCOSEU NEGATIVE 12/10/2017 1112   HGBUR MODERATE (A) 12/10/2017 1112   BILIRUBINUR NEGATIVE 12/10/2017 1112   KETONESUR NEGATIVE 12/10/2017 1112   PROTEINUR NEGATIVE 12/10/2017 1112   UROBILINOGEN 0.2 02/20/2015 1300   NITRITE NEGATIVE 12/10/2017 1112   LEUKOCYTESUR NEGATIVE 12/10/2017 1112   Sepsis Labs: @LABRCNTIP (procalcitonin:4,lacticidven:4)  )No results found for this or any previous visit (from the past 240 hour(s)).    Studies: Dg Chest 2 View  Result Date: 12/10/2017 CLINICAL DATA:  Chest pain EXAM: CHEST  2 VIEW COMPARISON:  10/09/2015 FINDINGS: The heart size and mediastinal contours are within normal limits. Both lungs are clear. The visualized skeletal structures are unremarkable. IMPRESSION: No active cardiopulmonary disease. Electronically Signed   By: Donavan Foil M.D.   On: 12/10/2017 20:05   Dg Abd 2 Views  Result Date: 12/11/2017 CLINICAL DATA:  Abdominal pain and gas for 2 days. EXAM: ABDOMEN - 2 VIEW COMPARISON:  None. FINDINGS: The bowel gas pattern is normal. There is no evidence of free air. No radio-opaque  calculi or other significant radiographic abnormality is seen. IMPRESSION: Negative exam. Electronically Signed   By: Inge Rise M.D.   On: 12/11/2017 10:06    Scheduled Meds: . aspirin EC  81 mg Oral Daily  . benztropine  1 mg Oral BID  . enoxaparin (LOVENOX) injection  40 mg Subcutaneous Q24H  . gabapentin  100 mg Oral TID  . lisinopril  5 mg Oral Daily  . metoprolol succinate  50 mg Oral Daily  . multivitamin with minerals  1 tablet Oral Daily  . pantoprazole  40 mg Oral Daily  . risperiDONE  2 mg Oral BID  . simethicone  80 mg Oral QID  . simvastatin  40 mg Oral q1800  . valACYclovir  1,000 mg Oral Daily    Continuous Infusions: . famotidine (PEPCID) IV Stopped (12/11/17 1140)     LOS: 0 days     Alma Friendly, MD [facility]  If 7PM-7AM, please contact night-coverage [email or web address] Password [facility] 12/11/2017, 6:52 PM

## 2017-12-11 NOTE — ED Notes (Signed)
Heart Healthy Diet was ordered for Lunch. 

## 2017-12-12 ENCOUNTER — Inpatient Hospital Stay (HOSPITAL_COMMUNITY): Payer: Medicare Other

## 2017-12-12 DIAGNOSIS — R079 Chest pain, unspecified: Secondary | ICD-10-CM

## 2017-12-12 DIAGNOSIS — E7800 Pure hypercholesterolemia, unspecified: Secondary | ICD-10-CM

## 2017-12-12 LAB — BASIC METABOLIC PANEL
Anion gap: 9 (ref 5–15)
BUN: 14 mg/dL (ref 6–20)
CO2: 23 mmol/L (ref 22–32)
CREATININE: 0.73 mg/dL (ref 0.44–1.00)
Calcium: 8.7 mg/dL — ABNORMAL LOW (ref 8.9–10.3)
Chloride: 109 mmol/L (ref 101–111)
GFR calc Af Amer: >60 mL/min (ref >60)
GFR calc non Af Amer: >60 mL/min (ref >60)
Glucose, Bld: 111 mg/dL — ABNORMAL HIGH (ref 65–99)
POTASSIUM: 3.8 mmol/L (ref 3.5–5.1)
SODIUM: 141 mmol/L (ref 135–145)

## 2017-12-12 LAB — GLUCOSE, CAPILLARY
GLUCOSE-CAPILLARY: 91 mg/dL (ref 65–99)
GLUCOSE-CAPILLARY: 114 mg/dL — AB (ref 65–99)
Glucose-Capillary: 205 mg/dL — ABNORMAL HIGH (ref 65–99)
Glucose-Capillary: 104 mg/dL — ABNORMAL HIGH (ref 65–99)

## 2017-12-12 MED ORDER — NITROGLYCERIN 0.4 MG SL SUBL
SUBLINGUAL_TABLET | SUBLINGUAL | Status: AC
  Filled 2017-12-12: qty 2

## 2017-12-12 MED ORDER — FAMOTIDINE 20 MG PO TABS
20.0000 mg | ORAL_TABLET | Freq: Two times a day (BID) | ORAL | Status: DC
  Administered 2017-12-12 – 2017-12-13 (×3): 20 mg via ORAL
  Filled 2017-12-12 (×3): qty 1

## 2017-12-12 MED ORDER — NITROGLYCERIN 0.4 MG SL SUBL
0.8000 mg | SUBLINGUAL_TABLET | Freq: Once | SUBLINGUAL | Status: AC
  Administered 2017-12-12: 0.8 mg via SUBLINGUAL

## 2017-12-12 MED ORDER — FLUTICASONE PROPIONATE 50 MCG/ACT NA SUSP
1.0000 | Freq: Every day | NASAL | Status: DC
  Administered 2017-12-12 – 2017-12-13 (×2): 1 via NASAL
  Filled 2017-12-12: qty 16

## 2017-12-12 MED ORDER — MENTHOL 3 MG MT LOZG
1.0000 | LOZENGE | OROMUCOSAL | Status: DC | PRN
  Administered 2017-12-12: 3 mg via ORAL
  Filled 2017-12-12: qty 9

## 2017-12-12 NOTE — Progress Notes (Addendum)
Coronary CTA showed the following FINDINGS: Non-cardiac: See separate report from [facility].  Calcium Score: 33 Agatston units  Coronary Arteries: Right dominant with no anomalies  LM: No plaque or stenosis.  LAD system: No plaque or stenosis.  Circumflex system: Mixed plaque with mild stenosis in the ostial LCx. No other significant disease in the LCx system.  RCA system: No plaque or stenosis.  IMPRESSION: 1. Coronary artery calcium score 93 Agatston units. This places the patient in the 91st percentile for age and gender, suggesting high risk for future cardiac events.  2. Mild stenosis proximal LCx. No obstructive coronary disease Noted.  Patient needs aggressive risk factor modification including smoking cessation counseling, aggressive treatment of hyperlipidemia, HTN and DM.  Her LDL goal is < 70.  Need to repeat FLP and adjust statin as needed and repeat HbA1C.  No further cardiac workup indicated at this time.  Will sign of.  Call with any questions.   Results were discussed with patient,

## 2017-12-12 NOTE — Progress Notes (Signed)
   12/12/17 1000  Clinical Encounter Type  Visited With Patient;Health care provider  Visit Type Initial;Spiritual support  Referral From Nurse  Consult/Referral To Chaplain  Spiritual Encounters  Spiritual Needs Brochure;Emotional  Stress Factors  Patient Stress Factors Exhausted;Loss  Family Stress Factors None identified    Chaplain visited patient and the patient was anxious about visit. Patient had forgot that she made request for advance directives. Chaplain assured pt that she was not there with bad news. Patient had experienced many death in her family in recent years and just lost her fiancee in January. Patient shared her concerns and her plans after she leaves the hospital. Patient is very active in her church and community and expressed a desire to be ordained in the future. Patient expressed her religious beliefs and church affiliation.Chaplian explained the advance directives at patient request and followed through with getting the forms from nurses station.Chaplain will return once patient have read directives and is ready to sign. Patient was very cheerful after the initial visit and look forward to getting her affairs in order in case of future hospital visits.

## 2017-12-12 NOTE — Progress Notes (Signed)
PROGRESS NOTE  Melissa Pham TIR:[id number] DOB: 1966-02-11 DOA: 12/10/2017 PCP: Benito Mccreedy, MD  HPI/Recap of past 24 hours: Melissa Pham is a 52 y.o. female with medical history significant for bipolar disorder, hypertension, diet-controlled diabetes, GERD, and hyperlipidemia, now presenting to the ED for evaluation of abdominal discomfort with cramping, loose stools, and flatulence. She also reported chest pain while being evaluated in the ED.  Patient reports that for the past several days, she has been experiencing increased cramping to the abdomen generally, associated with nonbloody loose stools and increased flatulence. Chest pressure is described as localized, moderate to severe in intensity, waxing and waning, no appreciable alleviating or exacerbating factors identified, and associated with shortness of breath. Pt reports a lot of stressors at home, with many recent deaths at home. She reports recent upper respiratory illness with sore throat and cough, but reports that this is nearly resolved now.  No fevers or chills. Pt admitted to the telemetry unit for ongoing evaluation and management of chest pressure in the setting of GI discomfort.  Today, pt reported to be feeling better. No further episodes of chest pain, still c/o bloating. For cardiac work up   Assessment/Plan: Principal Problem:   Chest pain Active Problems:   Diabetes mellitus, type 2 (HCC)   HTN (hypertension)   Bipolar affective disorder (HCC)   Generalized abdominal discomfort   Pure hypercholesterolemia  Chest pain Atypical  Trop X 3 neg, EKG with no acute ST changes ECHO with normal EF, no regional wall motion abnormalities Treated with ASA 324 mg in ED Cardiology consulted, CT coronary showed a coronary artery calcium score 93, indicating high risk for future cardiac events. Rec medical optimization of meds Continue ASA, lisinopril, metoprolol, and statin   Abdominal discomfort Unclear etiology   Lipase, LFTs negative Abdominal Xray neg Continue Pepcid, sucralfate, and prn GI cocktail   Hypertension  Stable  Continue lisinopril and metoprolol   Bipolar disorder  Continue Risperdal and trazodone    Hypokalemia Replace prn      Code Status: Full  Family Communication: None at bedside  Disposition Plan: Home likely 12/13/17   Consultants:  Cardiology  Procedures:  None   Antimicrobials:  None  DVT prophylaxis:  Lovenox   Objective: Vitals:   12/12/17 0948 12/12/17 1122 12/12/17 1453 12/12/17 2144  BP: (!) 112/50  110/68 120/68  Pulse:   71 64  Resp:    18  Temp:   98.2 F (36.8 C) 97.9 F (36.6 C)  TempSrc:   Oral Oral  SpO2:  99% 100% 100%  Weight:      Height:        Intake/Output Summary (Last 24 hours) at 12/12/2017 2154 Last data filed at 12/12/2017 1826 Gross per 24 hour  Intake 770 ml  Output -  Net 770 ml   Filed Weights   12/11/17 1401 12/12/17 0648  Weight: 82.1 kg (181 lb 1.6 oz) 82.3 kg (181 lb 8 oz)    Exam:   General:  Alert, awake, oriented, NAD   Cardiovascular: S1,S2 present, no added hrt sound  Respiratory: Chest clear bilaterally  Abdomen: Soft, non-tender, non-distended, BS present  Musculoskeletal: No pedal edema bilaterally  Skin: Normal  Psychiatry: Normal mood   Data Reviewed: CBC: Recent Labs  Lab 12/10/17 1118  WBC 5.2  HGB 12.5  HCT 36.5  MCV 86.3  PLT 676   Basic Metabolic Panel: Recent Labs  Lab 12/10/17 1118 12/11/17 0339 12/12/17 0408  NA 142 144 141  K 3.9 3.1* 3.8  CL 110 109 109  CO2 20* 25 23  GLUCOSE 102* 99 111*  BUN 11 10 14   CREATININE 0.60 0.61 0.73  CALCIUM 9.2 8.5* 8.7*   GFR: Estimated Creatinine Clearance: 80.9 mL/min (by C-G formula based on SCr of 0.73 mg/dL). Liver Function Tests: Recent Labs  Lab 12/10/17 1118  AST 30  ALT 29  ALKPHOS 73  BILITOT 0.7  PROT 6.7  ALBUMIN 3.5   Recent Labs  Lab 12/10/17 1118  LIPASE 29   No results for  input(s): AMMONIA in the last 168 hours. Coagulation Profile: No results for input(s): INR, PROTIME in the last 168 hours. Cardiac Enzymes: Recent Labs  Lab 12/10/17 2318 12/11/17 0339 12/11/17 1355  TROPONINI <0.03 <0.03 <0.03   BNP (last 3 results) No results for input(s): PROBNP in the last 8760 hours. HbA1C: No results for input(s): HGBA1C in the last 72 hours. CBG: Recent Labs  Lab 12/11/17 2134 12/12/17 0756 12/12/17 1134 12/12/17 1658 12/12/17 2146  GLUCAP 103* 91 205* 104* 114*   Lipid Profile: No results for input(s): CHOL, HDL, LDLCALC, TRIG, CHOLHDL, LDLDIRECT in the last 72 hours. Thyroid Function Tests: No results for input(s): TSH, T4TOTAL, FREET4, T3FREE, THYROIDAB in the last 72 hours. Anemia Panel: No results for input(s): VITAMINB12, FOLATE, FERRITIN, TIBC, IRON, RETICCTPCT in the last 72 hours. Urine analysis:    Component Value Date/Time   COLORURINE YELLOW 12/10/2017 1112   APPEARANCEUR CLEAR 12/10/2017 1112   LABSPEC 1.014 12/10/2017 1112   PHURINE 5.0 12/10/2017 1112   GLUCOSEU NEGATIVE 12/10/2017 1112   HGBUR MODERATE (A) 12/10/2017 1112   BILIRUBINUR NEGATIVE 12/10/2017 1112   KETONESUR NEGATIVE 12/10/2017 1112   PROTEINUR NEGATIVE 12/10/2017 1112   UROBILINOGEN 0.2 02/20/2015 1300   NITRITE NEGATIVE 12/10/2017 1112   LEUKOCYTESUR NEGATIVE 12/10/2017 1112   Sepsis Labs: @LABRCNTIP (procalcitonin:4,lacticidven:4)  )No results found for this or any previous visit (from the past 240 hour(s)).    Studies: Ct Coronary Morph W/cta Cor W/score W/ca W/cm &/or Wo/cm  Addendum Date: 12/12/2017   ADDENDUM REPORT: 12/12/2017 17:21 CLINICAL DATA:  Chest pain EXAM: Cardiac CTA MEDICATIONS: Sub lingual nitro. 4mg  x 2 and lopressor 5mg  IV TECHNIQUE: The patient was scanned on a Siemens 409 slice scanner. Gantry rotation speed was 240 msecs. Collimation was 0.6 mm. A 100 kV prospective scan was triggered in the ascending thoracic aorta at 35-75% of the  R-R interval. Average HR during the scan was 65 bpm. The 3D data set was interpreted on a dedicated work station using MPR, MIP and VRT modes. A total of 80cc of contrast was used. FINDINGS: Non-cardiac: See separate report from [facility]. Calcium Score: 33 Agatston units Coronary Arteries: Right dominant with no anomalies LM: No plaque or stenosis. LAD system: No plaque or stenosis. Circumflex system: Mixed plaque with mild stenosis in the ostial LCx. No other significant disease in the LCx system. RCA system: No plaque or stenosis. IMPRESSION: 1. Coronary artery calcium score 93 Agatston units. This places the patient in the 91st percentile for age and gender, suggesting high risk for future cardiac events. 2. Mild stenosis proximal LCx. No obstructive coronary disease noted. Dalton Mclean Electronically Signed   By: Loralie Champagne M.D.   On: 12/12/2017 17:21   Result Date: 12/12/2017 EXAM: OVER-READ INTERPRETATION CT CHEST The following report is an over-read performed by radiologist Dr. Evangeline Dakin of [facility], [place] on 12/12/2017. This over-read does not include interpretation of cardiac or coronary anatomy  or pathology. The coronary calcium score/coronary CTA interpretation by the cardiologist is attached. COMPARISON:  CT chest 07/09/2010. FINDINGS: Vascular: No visible thoracic or upper abdominal aortic atherosclerosis. No evidence of aortic aneurysm. Mediastinum/Nodes: No pathologic lymphadenopathy within the visualized mediastinum. Visualized esophagus normal in appearance. Lungs/Pleura: Emphysematous changes throughout the visualized portions of both lungs. Minimal scarring involving the lingula and left lower lobe. Lung parenchyma otherwise clear. Central airways patent without significant bronchial wall thickening. No pleural effusions. Upper Abdomen: Extreme upper abdomen unremarkable for the early arterial phase of enhancement. Musculoskeletal: Spondylosis involving the  visualized mid and lower thoracic spine. No acute abnormality. IMPRESSION: 1.  Emphysema (ICD10-J43.9). 2. No significant extracardiac findings otherwise. Electronically Signed: By: Evangeline Dakin M.D. On: 12/12/2017 16:10    Scheduled Meds: . aspirin EC  81 mg Oral Daily  . benztropine  1 mg Oral BID  . enoxaparin (LOVENOX) injection  40 mg Subcutaneous Q24H  . famotidine  20 mg Oral BID  . fluticasone  1 spray Each Nare Daily  . gabapentin  100 mg Oral TID  . metoprolol succinate  50 mg Oral Daily  . multivitamin with minerals  1 tablet Oral Daily  . nitroGLYCERIN      . pantoprazole  40 mg Oral Daily  . risperiDONE  2 mg Oral BID  . simethicone  80 mg Oral QID  . simvastatin  40 mg Oral q1800  . valACYclovir  1,000 mg Oral Daily    Continuous Infusions:    LOS: 1 day     Alma Friendly, MD [facility]  If 7PM-7AM, please contact night-coverage [email or web address] Password [facility] 12/12/2017, 9:54 PM

## 2017-12-12 NOTE — Consult Note (Signed)
Cardiology Consultation:   Patient ID: Melissa Pham; [id number]; February 01, 1966   Admit date: 12/10/2017 Date of Consult: 12/12/2017  Primary Care Provider: Benito Mccreedy, MD Primary Cardiologist: Gwenlyn Saran  Patient Profile:   Melissa Pham is a 53 y.o. female with no hx of prior CAD, however with a hx of bipolar disorder, HTN, DM II, GERD, and hyperlipidemia who is being seen today for the evaluation of chest pain at the request of Dr. Myna Hidalgo.   History of Present Illness:   Melissa Pham is a very pleasant 52 yo F with a hx significant for GERD symptoms for many years including flatulence, reflux, and loose stools in which she takes omeprazole. She is followed by her PCP for these symptoms.   Melissa Pham presented to the hospital on 12/10/17 with c/o abdominal pain, GERD symptoms, flatulence, and non-bloody loose stools. She has also described classic anginal symptoms associated with her GI distress. She states that for many years she has been experiencing intermittent chest pain and pressure associated with significant diaphoresis that have now become for frequent in duration since the passing of her mother and significant other back in September of last year. She reports that her symptoms, which include 10/10 mid-strernal chest pressure and squeezing that radiates to her left underarm and left neck has been happening almost daily since September. She is unclear as to whether her symptoms are related to exertion or food ingestion. She cannot determine what makes her symptoms worse or better. She states that they normally resolve on their own after a short period of time. She reports family history of her father and multiple nieces and newphews with MI and CAD. She denies back pain, numbness and tingling in her arms or hands. She admits to N/V for many years, not associated with her chest discomfort.   Of note, the patient has had a prior stress test in 02/2015 which showed a small area of  non perfusion involving the apical aspect of the anterior wall of the left ventricle which minimally improves on the provided stress images and is without regional wall motion abnormality - as such, this is favored to represent an area of attenuation though conceivably, an area of prior infarction could have a similar scintigraphic appearance.   On admission on 12/10/17, her troponin levels have been negative x3. Her EKG shows SB and is unremarkable for acute ischemic changes. An echocardiogram was performed which showed normal LVEF function. She has had no chest pain since admission.   Past Medical History:  Diagnosis Date  . ADHD (attention deficit hyperactivity disorder)   . Anxiety   . Asthma   . Bipolar disorder ([place])   . Blood transfusion without reported diagnosis 1985   after childbirth  . Depression   . Diabetes mellitus without complication ([place])   . GERD (gastroesophageal reflux disease)   . Hyperlipidemia   . Hypertension   . Schizophrenia ([place])   . Sickle cell trait [place])     Past Surgical History:  Procedure Laterality Date  . CERVICAL BIOPSY  W/ LOOP ELECTRODE EXCISION    . left eye surgery  1970  . TUBAL LIGATION  1989     Prior to Admission medications   Medication Sig Start Date End Date Taking? Authorizing Provider  acetaminophen (TYLENOL) 500 MG tablet Take 1,000 mg by mouth every 6 (six) hours as needed for mild pain.   Yes [provider]  albuterol (PROVENTIL HFA;VENTOLIN HFA) 108 (90 BASE) MCG/ACT inhaler Inhale 2  puffs into the lungs every 6 (six) hours as needed for wheezing or shortness of breath.    Yes [provider]  albuterol (PROVENTIL) (2.5 MG/3ML) 0.083% nebulizer solution Take 2.5 mg by nebulization every 6 (six) hours as needed for wheezing or shortness of breath.   Yes [provider]  aspirin EC 81 MG tablet Take 81 mg by mouth daily.   Yes [provider]  benztropine (COGENTIN) 1 MG tablet Take 1 mg by  mouth 2 (two) times daily.   Yes [provider]  cyclobenzaprine (FLEXERIL) 5 MG tablet Take 5 mg by mouth 2 (two) times daily as needed for muscle spasms.    Yes [provider]  gabapentin (NEURONTIN) 600 MG tablet Take 100 mg by mouth 3 (three) times daily.    Yes [provider]  hydrOXYzine (ATARAX/VISTARIL) 25 MG tablet Take 25 mg by mouth every 6 (six) hours as needed for itching.    Yes [provider]  ibuprofen (ADVIL,MOTRIN) 800 MG tablet Take 1 tablet (800 mg total) by mouth every 8 (eight) hours as needed. 04/04/17  Yes Mcarthur Rossetti, MD  Lidocaine-Glycerin (PREPARATION H RE) Place 1 application rectally as needed (hemmroids).   Yes [provider]  lisinopril (PRINIVIL,ZESTRIL) 5 MG tablet Take 5 mg by mouth daily.   Yes [provider]  metoprolol succinate (TOPROL-XL) 50 MG 24 hr tablet Take 50 mg by mouth daily. Take with or immediately following a meal.   Yes [provider]  mometasone (NASONEX) 50 MCG/ACT nasal spray Place 2 sprays into the nose 2 (two) times daily as needed (allergies).    Yes [provider]  Multiple Vitamins-Minerals (MULTIVITAMIN WITH MINERALS) tablet Take 1 tablet by mouth daily.   Yes [provider]  nitroGLYCERIN (NITROSTAT) 0.4 MG SL tablet Place 0.4 mg under the tongue every 5 (five) minutes as needed for chest pain.   Yes [provider]  omeprazole (PRILOSEC) 20 MG capsule Take 2 capsules (40 mg total) by mouth daily. 02/21/15  Yes Hosie Poisson, MD  PARoxetine (PAXIL) 20 MG tablet Take 20 mg by mouth daily as needed (hotflashes).    Yes [provider]  Pseudoeph-Doxylamine-DM-APAP (NYQUIL PO) Take 2 capsules by mouth as needed (cold symptoms).   Yes [provider]  Pseudoephedrine-APAP-DM (DAYQUIL PO) Take 2 capsules by mouth as needed (cold symptoms).   Yes [provider]  risperiDONE (RISPERDAL) 2 MG tablet Take 2 mg by  mouth 2 (two) times daily.   Yes [provider]  simethicone (MYLICON) 80 MG chewable tablet Chew 80 mg by mouth 4 (four) times daily as needed for flatulence.    Yes [provider]  simvastatin (ZOCOR) 40 MG tablet Take 40 mg by mouth daily.   Yes [provider]  traZODone (DESYREL) 100 MG tablet Take 100-200 mg by mouth at bedtime as needed for sleep.    Yes [provider]  valACYclovir (VALTREX) 1000 MG tablet Take 1,000 mg by mouth daily.   Yes [provider]    Inpatient Medications: Scheduled Meds: . aspirin EC  81 mg Oral Daily  . benztropine  1 mg Oral BID  . enoxaparin (LOVENOX) injection  40 mg Subcutaneous Q24H  . gabapentin  100 mg Oral TID  . lisinopril  5 mg Oral Daily  . metoprolol succinate  50 mg Oral Daily  . multivitamin with minerals  1 tablet Oral Daily  . pantoprazole  40 mg Oral Daily  .  risperiDONE  2 mg Oral BID  . simethicone  80 mg Oral QID  . simvastatin  40 mg Oral q1800  . valACYclovir  1,000 mg Oral Daily   Continuous Infusions: . famotidine (PEPCID) IV Stopped (12/11/17 2307)   PRN Meds: acetaminophen, albuterol, cyclobenzaprine, gi cocktail, HYDROcodone-acetaminophen, hydrOXYzine, ketorolac, nitroGLYCERIN, ondansetron (ZOFRAN) IV, traZODone  Allergies:    Allergies  Allergen Reactions  . Sulfa Antibiotics Anaphylaxis    Social History:   Social History   Socioeconomic History  . Marital status: Single    Spouse name: Not on file  . Number of children: Not on file  . Years of education: Not on file  . Highest education level: Not on file  Social Needs  . Financial resource strain: Not on file  . Food insecurity - worry: Not on file  . Food insecurity - inability: Not on file  . Transportation needs - medical: Not on file  . Transportation needs - non-medical: Not on file  Occupational History  . Not on file  Tobacco Use  . Smoking status: Current Every Day Smoker    Packs/day: 0.25      Years: 4.00    Pack years: 1.00    Types: Cigarettes  . Smokeless tobacco: Never Used  Substance and Sexual Activity  . Alcohol use: No    Alcohol/week: 0.0 oz  . Drug use: No    Comment: no drug use for 7 years  . Sexual activity: Not Currently    Birth control/protection: Surgical  Other Topics Concern  . Not on file  Social History Narrative  . Not on file    Family History:   Family History  Problem Relation Age of Onset  . Hyperlipidemia Mother   . Arthritis Mother   . Heart attack Father   . Diabetes Father   . Hypertension Father   . Breast cancer Sister   . Cancer Sister        cervical  . Breast cancer Paternal Aunt   . Colon cancer Maternal Grandmother 53  . Esophageal cancer Neg Hx   . Rectal cancer Neg Hx    Family Status:  Family Status  Relation Name Status  . Mother  Alive  . Father  Alive  . Sister  Alive  . Ethlyn Daniels  Deceased  . MGM  (Not Specified)  . Neg Hx  (Not Specified)    ROS:  Please see the history of present illness.  All other ROS reviewed and negative.     Physical Exam/Data:   Vitals:   12/11/17 1401 12/11/17 1626 12/11/17 2131 12/12/17 0648  BP: 97/62 106/67 (!) 89/58 (!) 91/53  Pulse: 62 67 (!) 56 (!) 59  Resp: 16 18 18 19   Temp: (!) 97.5 F (36.4 C) 97.7 F (36.5 C) 98 F (36.7 C) 97.8 F (36.6 C)  TempSrc: Oral Oral Oral Oral  SpO2: 100% 100% 100% 98%  Weight: 181 lb 1.6 oz (82.1 kg)   181 lb 8 oz (82.3 kg)  Height: 5\' 2"  (1.575 m)       Intake/Output Summary (Last 24 hours) at 12/12/2017 0830 Last data filed at 12/12/2017 0555 Gross per 24 hour  Intake 272 ml  Output -  Net 272 ml   Filed Weights   12/11/17 1401 12/12/17 0648  Weight: 181 lb 1.6 oz (82.1 kg) 181 lb 8 oz (82.3 kg)   Body mass index is 33.2 kg/m.   General: Well developed, well nourished, NAD Skin: Warm,  dry, intact  Head: Normocephalic, atraumatic, clear, moist mucus membranes. Neck: Negative for carotid bruits. No JVD Lungs:Clear to  ausculation bilaterally. No wheezes, rales, or rhonchi. Breathing is unlabored. Cardiovascular: RRR with S1 S2. No murmurs, rubs, gallops, or LV heave appreciated. Abdomen: Soft, non-tender, non-distended with normoactive bowel sounds.  No obvious abdominal masses. MSK: Strength and tone appear normal for age. 5/5 in all extremities Extremities: No edema. No clubbing or cyanosis. DP/PT pulses 2+ bilaterally Neuro: Alert and oriented. No focal deficits. No facial asymmetry. MAE spontaneously. Psych: Responds to questions appropriately with normal affect.     EKG:  The EKG was personally reviewed and demonstrates: 12/11/17 SB. No acute changes  Telemetry:  Telemetry was personally reviewed and demonstrates: 12/12/17 SR/SB 45-55  Relevant CV Studies:  ECHO: 12/11/17 Study Conclusions  - Left ventricle: The cavity size was normal. There was mild   concentric hypertrophy. Systolic function was normal. The   estimated ejection fraction was in the range of 55% to 60%. Wall   motion was normal; there were no regional wall motion   abnormalities. - Mitral valve: Valve area by pressure half-time: 1.85 cm^2.   NM Myocardial Stress 02/20/17: IMPRESSION: 1. Small area of non perfusion involving the apical aspect of the anterior wall of the left ventricle which minimally improves on the provided stress images and is without regional wall motion abnormality - as such, this is favored to represent an area of attenuation though conceivably, an area of prior infarction could have a similar scintigraphic appearance. 2. No scintigraphic evidence of pharmacologically induced ischemia. 3. Normal wall motion.  Ejection fraction - 61%.  Laboratory Data:  Chemistry Recent Labs  Lab 12/10/17 1118 12/11/17 0339 12/12/17 0408  NA 142 144 141  K 3.9 3.1* 3.8  CL 110 109 109  CO2 20* 25 23  GLUCOSE 102* 99 111*  BUN 11 10 14   CREATININE 0.60 0.61 0.73  CALCIUM 9.2 8.5* 8.7*  GFRNONAA >60 >60  >60  GFRAA >60 >60 >60  ANIONGAP 12 10 9     Total Protein  Date Value Ref Range Status  12/10/2017 6.7 6.5 - 8.1 g/dL Final   Albumin  Date Value Ref Range Status  12/10/2017 3.5 3.5 - 5.0 g/dL Final   AST  Date Value Ref Range Status  12/10/2017 30 15 - 41 U/L Final   ALT  Date Value Ref Range Status  12/10/2017 29 14 - 54 U/L Final   Alkaline Phosphatase  Date Value Ref Range Status  12/10/2017 73 38 - 126 U/L Final   Total Bilirubin  Date Value Ref Range Status  12/10/2017 0.7 0.3 - 1.2 mg/dL Final   Hematology Recent Labs  Lab 12/10/17 1118  WBC 5.2  RBC 4.23  HGB 12.5  HCT 36.5  MCV 86.3  MCH 29.6  MCHC 34.2  RDW 14.3  PLT 289   Cardiac Enzymes Recent Labs  Lab 12/10/17 2318 12/11/17 0339 12/11/17 1355  TROPONINI <0.03 <0.03 <0.03    Recent Labs  Lab 12/10/17 2043  TROPIPOC 0.00    BNPNo results for input(s): BNP, PROBNP in the last 168 hours.  DDimer No results for input(s): DDIMER in the last 168 hours. TSH:  Lab Results  Component Value Date   TSH 1.257 09/13/2009   Lipids: Lab Results  Component Value Date   CHOL 138 08/29/2008   HDL 41 08/29/2008   LDLCALC 83 08/29/2008   TRIG 71 08/29/2008   CHOLHDL 3.4 Ratio 08/29/2008   HgbA1c:  Lab Results  Component Value Date   HGBA1C 6.4 (H) 02/20/2015    Radiology/Studies:  Dg Chest 2 View  Result Date: 12/10/2017 CLINICAL DATA:  Chest pain EXAM: CHEST  2 VIEW COMPARISON:  10/09/2015 FINDINGS: The heart size and mediastinal contours are within normal limits. Both lungs are clear. The visualized skeletal structures are unremarkable. IMPRESSION: No active cardiopulmonary disease. Electronically Signed   By: Donavan Foil M.D.   On: 12/10/2017 20:05   Dg Abd 2 Views  Result Date: 12/11/2017 CLINICAL DATA:  Abdominal pain and gas for 2 days. EXAM: ABDOMEN - 2 VIEW COMPARISON:  None. FINDINGS: The bowel gas pattern is normal. There is no evidence of free air. No radio-opaque calculi or  other significant radiographic abnormality is seen. IMPRESSION: Negative exam. Electronically Signed   By: Inge Rise M.D.   On: 12/11/2017 10:06   Assessment and Plan:   1. Chest pain: -Denies chest pain since admission, no other s/s -Trop negative x 3 -Echocardiogram with normal EF and no regional wall motion abnormalities -Last Myoview stress with no evidence of pharmacologically induced ischemia -Given her anginal symptoms, including diaphoresis and typical anginal pain radiation, will schedule her for coronary CTA for further evaluation -Continue ASA, BB  2. HTN: -Stable, mildly decreased, 91/53>106/67>97/62>103/67 -On lisinopril, metoprolol, statin -Consider decreasing or stopping her ACEI?  3: HLD: -Will obtain lipid panel with AM labs -Continue statin  4. Abdominal pain: -Per IM -Abdominal xray negative  For questions or updates, please contact [place] Please consult [email or web address] for contact info under Cardiology/STEMI.   SignedKathyrn Drown NP-C [facility] Pager: [phone] 12/12/2017 8:30 AM

## 2017-12-13 DIAGNOSIS — E7800 Pure hypercholesterolemia, unspecified: Secondary | ICD-10-CM

## 2017-12-13 LAB — BASIC METABOLIC PANEL
ANION GAP: 11 (ref 5–15)
BUN: 15 mg/dL (ref 6–20)
CO2: 23 mmol/L (ref 22–32)
CREATININE: 0.72 mg/dL (ref 0.44–1.00)
Calcium: 9.1 mg/dL (ref 8.9–10.3)
Chloride: 106 mmol/L (ref 101–111)
GFR calc Af Amer: >60 mL/min (ref >60)
GLUCOSE: 111 mg/dL — AB (ref 65–99)
Potassium: 4 mmol/L (ref 3.5–5.1)
Sodium: 140 mmol/L (ref 135–145)

## 2017-12-13 LAB — LIPID PANEL
Cholesterol: 201 mg/dL — ABNORMAL HIGH (ref 0–200)
HDL: 48 mg/dL (ref >40)
LDL Cholesterol: 126 mg/dL — ABNORMAL HIGH (ref 0–99)
Total CHOL/HDL Ratio: 4.2 RATIO
Triglycerides: 133 mg/dL (ref <150)
VLDL: 27 mg/dL (ref 0–40)

## 2017-12-13 LAB — HEMOGLOBIN A1C
HEMOGLOBIN A1C: 6.5 % — AB (ref 4.8–5.6)
MEAN PLASMA GLUCOSE: 139.85 mg/dL

## 2017-12-13 LAB — GLUCOSE, CAPILLARY: GLUCOSE-CAPILLARY: 98 mg/dL (ref 65–99)

## 2017-12-13 MED ORDER — ATORVASTATIN CALCIUM 40 MG PO TABS: 40.0000 mg | ORAL_TABLET | Freq: Every day | ORAL | 0 refills | Status: DC

## 2017-12-13 MED ORDER — INFLUENZA VAC SPLIT QUAD 0.5 ML IM SUSY
0.5000 mL | PREFILLED_SYRINGE | Freq: Once | INTRAMUSCULAR | Status: AC
  Administered 2017-12-13: 0.5 mL via INTRAMUSCULAR

## 2017-12-13 MED ORDER — NICOTINE POLACRILEX 2 MG MT GUM: 2.0000 mg | CHEWING_GUM | OROMUCOSAL | 0 refills | Status: DC | PRN

## 2017-12-13 MED ORDER — NICOTINE 14 MG/24HR TD PT24: 14.0000 mg | MEDICATED_PATCH | TRANSDERMAL | 0 refills | Status: AC

## 2017-12-13 NOTE — Discharge Summary (Signed)
Discharge Summary  Melissa Pham HQP:[id number] DOB: [birth year]  PCP: Melissa Mccreedy, MD  Admit date: 12/10/2017 Discharge date: 12/13/2017  Time spent: > 30 mins  Recommendations for Outpatient Follow-up:  1. PCP  Discharge Diagnoses:  Active Hospital Problems   Diagnosis Date Noted  . Chest pain 02/20/2015  . Pure hypercholesterolemia   . Generalized abdominal discomfort 12/10/2017  . Bipolar affective disorder ([place]) 02/20/2015  . Diabetes mellitus, type 2 ([facility]) 02/20/2015  . HTN (hypertension) 02/20/2015    Resolved Hospital Problems  No resolved problems to display.    Discharge Condition: Stable  Diet recommendation: Heart healthy/mod carb  Vitals:   12/12/17 2144 12/13/17 0547  BP: 120/68 106/64  Pulse: 64 69  Resp: 18   Temp: 97.9 F (36.6 C) 97.7 F (36.5 C)  SpO2: 100% 99%    History of present illness:  Melissa McKinnonis a 52 y.o.femalewith medical history significant forbipolar disorder, hypertension, diet-controlled diabetes, GERD, and hyperlipidemia, now presenting to the ED for evaluation of abdominal discomfort with cramping, loose stools, and flatulence. She also reported chest pain while being evaluated in the ED. Patient reports that for the past several days, she has been experiencing increased cramping to the abdomen generally, associated with nonbloody loose stools and increased flatulence. Chest pressure is described as localized, moderate to severe in intensity, waxing and waning, no appreciable alleviating or exacerbating factors identified, and associated with shortness of breath. Pt reports a lot of stressors at home, with many recent deaths at home. She reports recent upper respiratory illness with sore throat and cough, but reports that this is nearly resolved now. No fevers or chills. Pt admitted to the telemetry unit for ongoing evaluation and management of chest pressure in the setting of GI discomfort.  Today, pt reported feeling  much better, denies any chest pain, SOB, abdominal pain, fever/chills. Pt stable for discharge  Hospital Course:  Principal Problem:   Chest pain Active Problems:   Diabetes mellitus, type 2 (HCC)   HTN (hypertension)   Bipolar affective disorder (HCC)   Generalized abdominal discomfort   Pure hypercholesterolemia  Chest pain Atypical Trop X 3 neg, EKG with no acute ST changes ECHO with normal EF, no regional wall motion abnormalities Cardiology consulted, CT coronary showed a coronary artery calcium score 93, indicating high risk for future cardiac events. Rec medical optimization of meds Continue ASA, lisinopril, metoprolol, and statin changed to lipitor 40mg   Abdominal discomfort Unclear etiology Lipase, LFTs negative Abdominal Xray neg Continue PPI and simethicone  Hypertension Stable Continue lisinopril and metoprolol  HLD LDL 126 Started on high intensity lipitor 40 mg daily  Diabetes mellitus type 2 A1c 6.5 Continue home medication  Bipolar disorder Continue Risperdal and trazodone  Tobacco abuse Cessation advised Nicotine patch and gum provided      Procedures:  CT Coronary   Consultations: Cardiology   Discharge Exam: BP 106/64 (BP Location: Right Arm)   Pulse 69   Temp 97.7 F (36.5 C) (Oral)   Resp 18   Ht 5\' 2"  (1.575 m)   Wt 82.3 kg (181 lb 8 oz)   SpO2 99%   BMI 33.20 kg/m   General: Alert, awake, oriented, NAD  Cardiovascular: S1, S2 present, no added heart sound Respiratory: Chest clear bilaterally   Discharge Instructions You were cared for by a hospitalist during your hospital stay. If you have any questions about your discharge medications or the care you received while you were in the hospital after you are discharged, you  can call the unit and asked to speak with the hospitalist on call if the hospitalist that took care of you is not available. Once you are discharged, your primary care physician will handle  any further medical issues. Please note that NO REFILLS for any discharge medications will be authorized once you are discharged, as it is imperative that you return to your primary care physician (or establish a relationship with a primary care physician if you do not have one) for your aftercare needs so that they can reassess your need for medications and monitor your lab values.   Allergies as of 12/13/2017      Reactions   Sulfa Antibiotics Anaphylaxis      Medication List    STOP taking these medications   ibuprofen 800 MG tablet Commonly known as:  ADVIL,MOTRIN   simvastatin 40 MG tablet Commonly known as:  ZOCOR     TAKE these medications   acetaminophen 500 MG tablet Commonly known as:  TYLENOL Take 1,000 mg by mouth every 6 (six) hours as needed for mild pain.   albuterol (2.5 MG/3ML) 0.083% nebulizer solution Commonly known as:  PROVENTIL Take 2.5 mg by nebulization every 6 (six) hours as needed for wheezing or shortness of breath.   albuterol 108 (90 Base) MCG/ACT inhaler Commonly known as:  PROVENTIL HFA;VENTOLIN HFA Inhale 2 puffs into the lungs every 6 (six) hours as needed for wheezing or shortness of breath.   aspirin EC 81 MG tablet Take 81 mg by mouth daily.   atorvastatin 40 MG tablet Commonly known as:  LIPITOR Take 1 tablet (40 mg total) by mouth daily.   benztropine 1 MG tablet Commonly known as:  COGENTIN Take 1 mg by mouth 2 (two) times daily.   cyclobenzaprine 5 MG tablet Commonly known as:  FLEXERIL Take 5 mg by mouth 2 (two) times daily as needed for muscle spasms.   DAYQUIL PO Take 2 capsules by mouth as needed (cold symptoms).   gabapentin 600 MG tablet Commonly known as:  NEURONTIN Take 100 mg by mouth 3 (three) times daily.   hydrOXYzine 25 MG tablet Commonly known as:  ATARAX/VISTARIL Take 25 mg by mouth every 6 (six) hours as needed for itching.   lisinopril 5 MG tablet Commonly known as:  PRINIVIL,ZESTRIL Take 5 mg by mouth  daily.   metoprolol succinate 50 MG 24 hr tablet Commonly known as:  TOPROL-XL Take 50 mg by mouth daily. Take with or immediately following a meal.   mometasone 50 MCG/ACT nasal spray Commonly known as:  NASONEX Place 2 sprays into the nose 2 (two) times daily as needed (allergies).   multivitamin with minerals tablet Take 1 tablet by mouth daily.   nicotine 14 mg/24hr patch Commonly known as:  NICODERM CQ - dosed in mg/24 hours Place 1 patch (14 mg total) onto the skin daily.   nicotine polacrilex 2 MG gum Commonly known as:  NICORETTE Take 1 each (2 mg total) by mouth as needed for smoking cessation.   nitroGLYCERIN 0.4 MG SL tablet Commonly known as:  NITROSTAT Place 0.4 mg under the tongue every 5 (five) minutes as needed for chest pain.   NYQUIL PO Take 2 capsules by mouth as needed (cold symptoms).   omeprazole 20 MG capsule Commonly known as:  PRILOSEC Take 2 capsules (40 mg total) by mouth daily.   PARoxetine 20 MG tablet Commonly known as:  PAXIL Take 20 mg by mouth daily as needed (hotflashes).   PREPARATION  H RE Place 1 application rectally as needed (hemmroids).   risperiDONE 2 MG tablet Commonly known as:  RISPERDAL Take 2 mg by mouth 2 (two) times daily.   simethicone 80 MG chewable tablet Commonly known as:  MYLICON Chew 80 mg by mouth 4 (four) times daily as needed for flatulence.   traZODone 100 MG tablet Commonly known as:  DESYREL Take 100-200 mg by mouth at bedtime as needed for sleep.   valACYclovir 1000 MG tablet Commonly known as:  VALTREX Take 1,000 mg by mouth daily.      Allergies  Allergen Reactions  . Sulfa Antibiotics Anaphylaxis   Follow-up Information    Osei-Bonsu, George, MD. Schedule an appointment as soon as possible for a visit in 1 week(s).   Specialty:  Internal Medicine Contact information: [address] [place]  [address] [phone]            The results of significant diagnostics  from this hospitalization (including imaging, microbiology, ancillary and laboratory) are listed below for reference.    Significant Diagnostic Studies: Dg Chest 2 View  Result Date: 12/10/2017 CLINICAL DATA:  Chest pain EXAM: CHEST  2 VIEW COMPARISON:  10/09/2015 FINDINGS: The heart size and mediastinal contours are within normal limits. Both lungs are clear. The visualized skeletal structures are unremarkable. IMPRESSION: No active cardiopulmonary disease. Electronically Signed   By: Donavan Foil M.D.   On: 12/10/2017 20:05   Ct Coronary Morph W/cta Cor W/score W/ca W/cm &/or Wo/cm  Addendum Date: 12/12/2017   ADDENDUM REPORT: 12/12/2017 17:21 CLINICAL DATA:  Chest pain EXAM: Cardiac CTA MEDICATIONS: Sub lingual nitro. 4mg  x 2 and lopressor 5mg  IV TECHNIQUE: The patient was scanned on a Siemens 606 slice scanner. Gantry rotation speed was 240 msecs. Collimation was 0.6 mm. A 100 kV prospective scan was triggered in the ascending thoracic aorta at 35-75% of the R-R interval. Average HR during the scan was 65 bpm. The 3D data set was interpreted on a dedicated work station using MPR, MIP and VRT modes. A total of 80cc of contrast was used. FINDINGS: Non-cardiac: See separate report from [facility]. Calcium Score: 33 Agatston units Coronary Arteries: Right dominant with no anomalies LM: No plaque or stenosis. LAD system: No plaque or stenosis. Circumflex system: Mixed plaque with mild stenosis in the ostial LCx. No other significant disease in the LCx system. RCA system: No plaque or stenosis. IMPRESSION: 1. Coronary artery calcium score 93 Agatston units. This places the patient in the 91st percentile for age and gender, suggesting high risk for future cardiac events. 2. Mild stenosis proximal LCx. No obstructive coronary disease noted. Dalton Mclean Electronically Signed   By: Loralie Champagne M.D.   On: 12/12/2017 17:21   Result Date: 12/12/2017 EXAM: OVER-READ INTERPRETATION CT CHEST The  following report is an over-read performed by radiologist Dr. Evangeline Dakin of [facility], [place] on 12/12/2017. This over-read does not include interpretation of cardiac or coronary anatomy or pathology. The coronary calcium score/coronary CTA interpretation by the cardiologist is attached. COMPARISON:  CT chest 07/09/2010. FINDINGS: Vascular: No visible thoracic or upper abdominal aortic atherosclerosis. No evidence of aortic aneurysm. Mediastinum/Nodes: No pathologic lymphadenopathy within the visualized mediastinum. Visualized esophagus normal in appearance. Lungs/Pleura: Emphysematous changes throughout the visualized portions of both lungs. Minimal scarring involving the lingula and left lower lobe. Lung parenchyma otherwise clear. Central airways patent without significant bronchial wall thickening. No pleural effusions. Upper Abdomen: Extreme upper abdomen unremarkable for the early arterial phase  of enhancement. Musculoskeletal: Spondylosis involving the visualized mid and lower thoracic spine. No acute abnormality. IMPRESSION: 1.  Emphysema (ICD10-J43.9). 2. No significant extracardiac findings otherwise. Electronically Signed: By: Evangeline Dakin M.D. On: 12/12/2017 16:10   Dg Abd 2 Views  Result Date: 12/11/2017 CLINICAL DATA:  Abdominal pain and gas for 2 days. EXAM: ABDOMEN - 2 VIEW COMPARISON:  None. FINDINGS: The bowel gas pattern is normal. There is no evidence of free air. No radio-opaque calculi or other significant radiographic abnormality is seen. IMPRESSION: Negative exam. Electronically Signed   By: Inge Rise M.D.   On: 12/11/2017 10:06    Microbiology: No results found for this or any previous visit (from the past 240 hour(s)).   Labs: Basic Metabolic Panel: Recent Labs  Lab 12/10/17 1118 12/11/17 0339 12/12/17 0408 12/13/17 0344  NA 142 144 141 140  K 3.9 3.1* 3.8 4.0  CL 110 109 109 106  CO2 20* 25 23 23   GLUCOSE 102* 99 111* 111*  BUN 11 10 14 15     CREATININE 0.60 0.61 0.73 0.72  CALCIUM 9.2 8.5* 8.7* 9.1   Liver Function Tests: Recent Labs  Lab 12/10/17 1118  AST 30  ALT 29  ALKPHOS 73  BILITOT 0.7  PROT 6.7  ALBUMIN 3.5   Recent Labs  Lab 12/10/17 1118  LIPASE 29   No results for input(s): AMMONIA in the last 168 hours. CBC: Recent Labs  Lab 12/10/17 1118  WBC 5.2  HGB 12.5  HCT 36.5  MCV 86.3  PLT 289   Cardiac Enzymes: Recent Labs  Lab 12/10/17 2318 12/11/17 0339 12/11/17 1355  TROPONINI <0.03 <0.03 <0.03   BNP: BNP (last 3 results) No results for input(s): BNP in the last 8760 hours.  ProBNP (last 3 results) No results for input(s): PROBNP in the last 8760 hours.  CBG: Recent Labs  Lab 12/12/17 0756 12/12/17 1134 12/12/17 1658 12/12/17 2146 12/13/17 0759  GLUCAP 91 205* 104* 114* 98       Signed:  Alma Friendly, MD [facility] 12/13/2017, 9:50 PM

## 2018-01-05 DIAGNOSIS — E785 Hyperlipidemia, unspecified: Secondary | ICD-10-CM | POA: Diagnosis not present

## 2018-01-05 DIAGNOSIS — E119 Type 2 diabetes mellitus without complications: Secondary | ICD-10-CM | POA: Diagnosis not present

## 2018-01-05 DIAGNOSIS — K219 Gastro-esophageal reflux disease without esophagitis: Secondary | ICD-10-CM | POA: Diagnosis not present

## 2018-01-05 DIAGNOSIS — F319 Bipolar disorder, unspecified: Secondary | ICD-10-CM | POA: Diagnosis not present

## 2018-01-05 DIAGNOSIS — Z87891 Personal history of nicotine dependence: Secondary | ICD-10-CM | POA: Diagnosis not present

## 2018-01-05 DIAGNOSIS — J45909 Unspecified asthma, uncomplicated: Secondary | ICD-10-CM | POA: Diagnosis not present

## 2018-01-05 DIAGNOSIS — E789 Disorder of lipoprotein metabolism, unspecified: Secondary | ICD-10-CM | POA: Diagnosis not present

## 2018-01-05 DIAGNOSIS — F209 Schizophrenia, unspecified: Secondary | ICD-10-CM | POA: Diagnosis not present

## 2018-01-05 DIAGNOSIS — I10 Essential (primary) hypertension: Secondary | ICD-10-CM | POA: Diagnosis not present

## 2018-01-05 DIAGNOSIS — E1140 Type 2 diabetes mellitus with diabetic neuropathy, unspecified: Secondary | ICD-10-CM | POA: Diagnosis not present

## 2018-01-05 DIAGNOSIS — R319 Hematuria, unspecified: Secondary | ICD-10-CM | POA: Diagnosis not present

## 2018-01-05 DIAGNOSIS — M1990 Unspecified osteoarthritis, unspecified site: Secondary | ICD-10-CM | POA: Diagnosis not present

## 2018-01-19 DIAGNOSIS — M1990 Unspecified osteoarthritis, unspecified site: Secondary | ICD-10-CM | POA: Diagnosis not present

## 2018-01-19 DIAGNOSIS — F314 Bipolar disorder, current episode depressed, severe, without psychotic features: Secondary | ICD-10-CM | POA: Diagnosis not present

## 2018-01-19 DIAGNOSIS — K219 Gastro-esophageal reflux disease without esophagitis: Secondary | ICD-10-CM | POA: Diagnosis not present

## 2018-01-19 DIAGNOSIS — F319 Bipolar disorder, unspecified: Secondary | ICD-10-CM | POA: Diagnosis not present

## 2018-01-19 DIAGNOSIS — I10 Essential (primary) hypertension: Secondary | ICD-10-CM | POA: Diagnosis not present

## 2018-01-19 DIAGNOSIS — E785 Hyperlipidemia, unspecified: Secondary | ICD-10-CM | POA: Diagnosis not present

## 2018-01-19 DIAGNOSIS — J45909 Unspecified asthma, uncomplicated: Secondary | ICD-10-CM | POA: Diagnosis not present

## 2018-01-19 DIAGNOSIS — R3915 Urgency of urination: Secondary | ICD-10-CM | POA: Diagnosis not present

## 2018-01-19 DIAGNOSIS — Z87891 Personal history of nicotine dependence: Secondary | ICD-10-CM | POA: Diagnosis not present

## 2018-01-19 DIAGNOSIS — E1140 Type 2 diabetes mellitus with diabetic neuropathy, unspecified: Secondary | ICD-10-CM | POA: Diagnosis not present

## 2018-01-19 DIAGNOSIS — E119 Type 2 diabetes mellitus without complications: Secondary | ICD-10-CM | POA: Diagnosis not present

## 2018-01-19 DIAGNOSIS — F209 Schizophrenia, unspecified: Secondary | ICD-10-CM | POA: Diagnosis not present

## 2018-04-20 DIAGNOSIS — Z113 Encounter for screening for infections with a predominantly sexual mode of transmission: Secondary | ICD-10-CM | POA: Diagnosis not present

## 2018-04-20 DIAGNOSIS — Z87891 Personal history of nicotine dependence: Secondary | ICD-10-CM | POA: Diagnosis not present

## 2018-04-20 DIAGNOSIS — E785 Hyperlipidemia, unspecified: Secondary | ICD-10-CM | POA: Diagnosis not present

## 2018-04-20 DIAGNOSIS — Z0110 Encounter for examination of ears and hearing without abnormal findings: Secondary | ICD-10-CM | POA: Diagnosis not present

## 2018-04-20 DIAGNOSIS — I10 Essential (primary) hypertension: Secondary | ICD-10-CM | POA: Diagnosis not present

## 2018-04-20 DIAGNOSIS — K219 Gastro-esophageal reflux disease without esophagitis: Secondary | ICD-10-CM | POA: Diagnosis not present

## 2018-04-20 DIAGNOSIS — H538 Other visual disturbances: Secondary | ICD-10-CM | POA: Diagnosis not present

## 2018-04-20 DIAGNOSIS — E1140 Type 2 diabetes mellitus with diabetic neuropathy, unspecified: Secondary | ICD-10-CM | POA: Diagnosis not present

## 2018-04-20 DIAGNOSIS — E119 Type 2 diabetes mellitus without complications: Secondary | ICD-10-CM | POA: Diagnosis not present

## 2018-04-20 DIAGNOSIS — M1990 Unspecified osteoarthritis, unspecified site: Secondary | ICD-10-CM | POA: Diagnosis not present

## 2018-04-20 DIAGNOSIS — J45909 Unspecified asthma, uncomplicated: Secondary | ICD-10-CM | POA: Diagnosis not present

## 2018-04-20 DIAGNOSIS — F209 Schizophrenia, unspecified: Secondary | ICD-10-CM | POA: Diagnosis not present

## 2018-04-20 DIAGNOSIS — F319 Bipolar disorder, unspecified: Secondary | ICD-10-CM | POA: Diagnosis not present

## 2018-05-03 IMAGING — CT CT HEART MORP W/ CTA COR W/ SCORE W/ CA W/CM &/OR W/O CM
4 of 7 series · 8 of 20 positions shown, 9 images · IV contrast (APPLIED)
Comparison: CT chest 07/09/2010.

CLINICAL DATA: Chest pain

EXAM:
Cardiac CTA
MEDICATIONS:
Sub lingual nitro. 4mg x 2 and lopressor 5mg IV
TECHNIQUE: The patient was scanned on a Siemens [REDACTED]ice scanner. Gantry
rotation speed was 240 msecs. Collimation was 0.6 mm. A 100 kV
prospective scan was triggered in the ascending thoracic aorta at
35-75% of the R-R interval. Average HR during the scan was 65 bpm.
The 3D data set was interpreted on a dedicated work station using
MPR, MIP and VRT modes. A total of 80cc of contrast was used.

[Series 7: best diast 73 % · axial · 0.30mm/px · z∈[+1129,+1173]mm · 2 of 333 slices shown, 3 images]
[im 111/333  vessel]
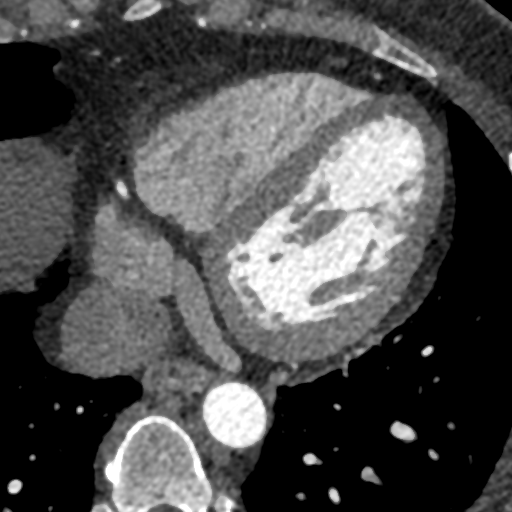
[im 111/333  lung]
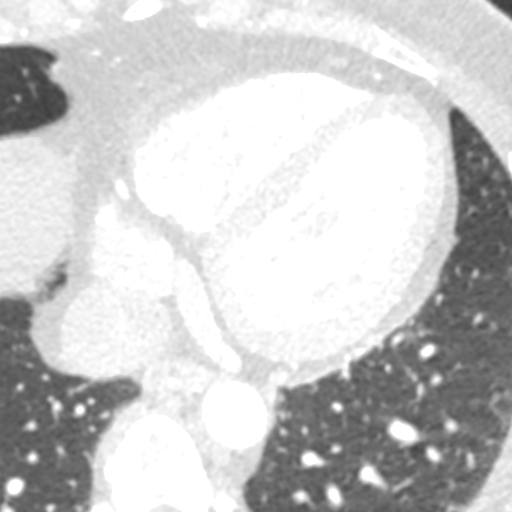
[im 222/333  vessel]
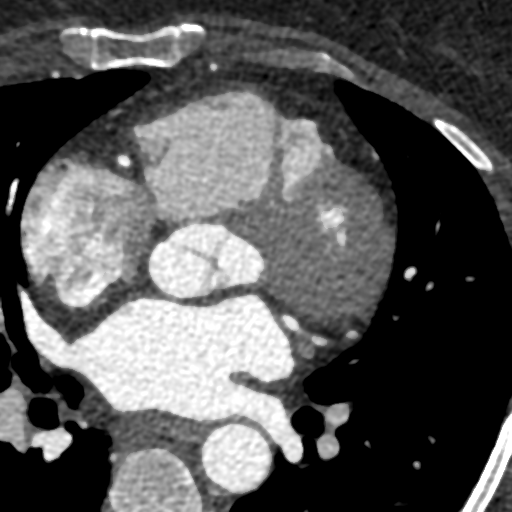

[Series 8: best syst 37 % · axial · 0.30mm/px · z∈[+1129,+1173]mm · 2 of 333 slices shown]
[im 111/333  vessel]
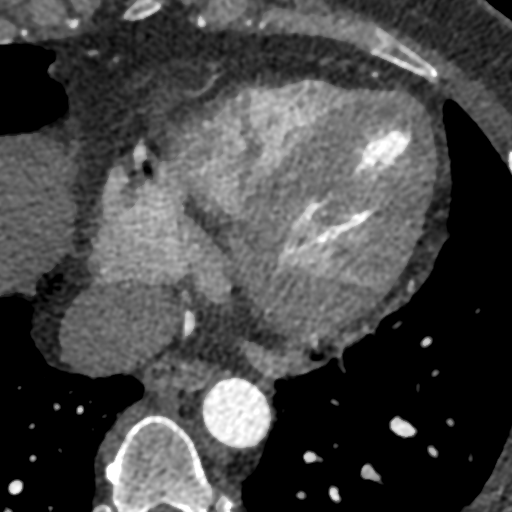
[im 222/333  vessel]
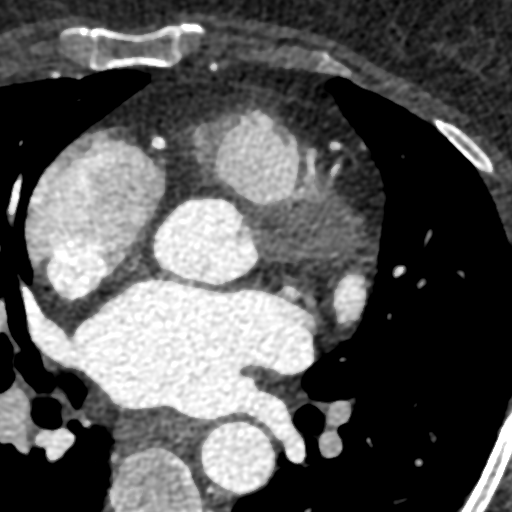

[Series 9: ts diast sharp 73 % · axial · 0.30mm/px · z∈[+1129,+1173]mm · 2 of 333 slices shown]
[im 111/333  lung]
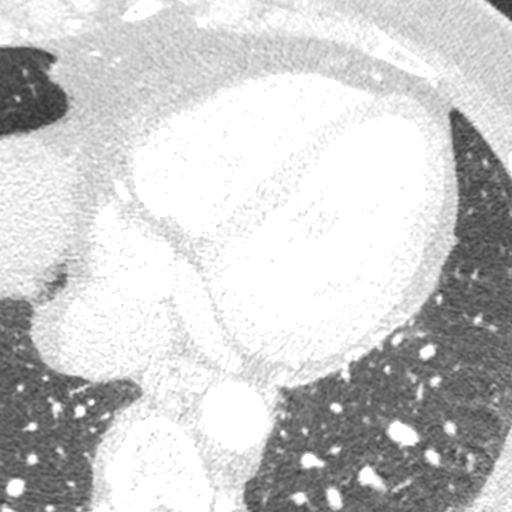
[im 222/333  lung]
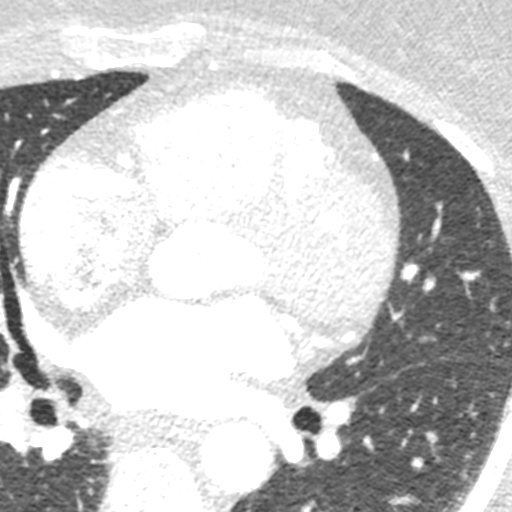

[Series 10: ts syst sharp 37 % · axial · 0.30mm/px · z∈[+1129,+1173]mm · 2 of 333 slices shown]
[im 111/333  lung]
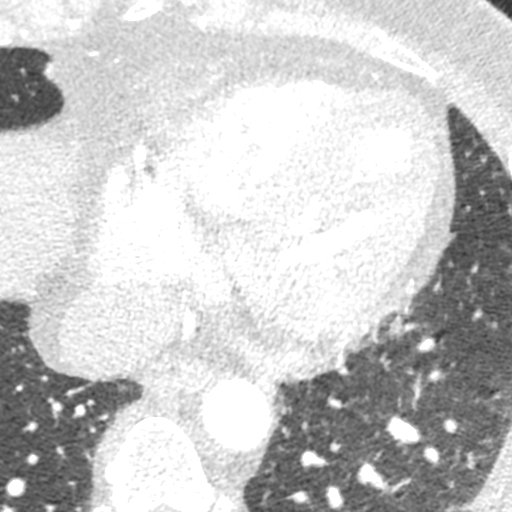
[im 222/333  lung]
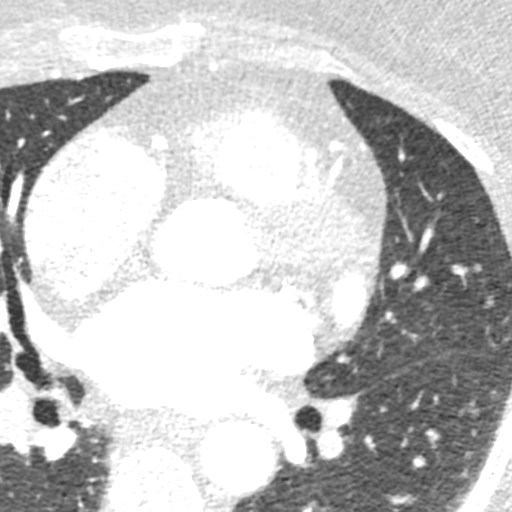

[8 of 20 positions shown; findings below may reference images not displayed]

FINDINGS: Non-cardiac: See separate report from [REDACTED].

Calcium Score: 33 Agatston units

Coronary Arteries: Right dominant with no anomalies

LM: No plaque or stenosis.

LAD system: No plaque or stenosis.

Circumflex system: Mixed plaque with mild stenosis in the ostial
LCx. No other significant disease in the LCx system.

RCA system: No plaque or stenosis.
IMPRESSION: 1. Coronary artery calcium score 93 Agatston units. This places the
patient in the 91st percentile for age and gender, suggesting high
risk for future cardiac events.

2. Mild stenosis proximal LCx. No obstructive coronary disease
noted.

Leyva Rascon

EXAM:
OVER-READ INTERPRETATION CT CHEST

The following report is an over-read performed by radiologist Dr.
over-read does not include interpretation of cardiac or coronary
anatomy or pathology. The coronary calcium score/coronary CTA
interpretation by the cardiologist is attached.
FINDINGS: Vascular: No visible thoracic or upper abdominal aortic
atherosclerosis. No evidence of aortic aneurysm.

Mediastinum/Nodes: No pathologic lymphadenopathy within the
visualized mediastinum. Visualized esophagus normal in appearance.

Lungs/Pleura: Emphysematous changes throughout the visualized
portions of both lungs. Minimal scarring involving the lingula and
left lower lobe. Lung parenchyma otherwise clear. Central airways
patent without significant bronchial wall thickening. No pleural
effusions.

Upper Abdomen: Extreme upper abdomen unremarkable for the early
arterial phase of enhancement.

Musculoskeletal: Spondylosis involving the visualized mid and lower
thoracic spine. No acute abnormality.
IMPRESSION: 1.  Emphysema ([id number]).
2. No significant extracardiac findings otherwise.

## 2018-06-25 ENCOUNTER — Emergency Department (HOSPITAL_COMMUNITY)
Admission: EM | Admit: 2018-06-25 | Discharge: 2018-06-25 | Disposition: A | Discharge status: Home / Self Care | Payer: Medicare Other | Attending: Emergency Medicine | Admitting: Emergency Medicine

## 2018-06-25 ENCOUNTER — Other Ambulatory Visit: Payer: Self-pay

## 2018-06-25 ENCOUNTER — Encounter (HOSPITAL_COMMUNITY): Payer: Self-pay

## 2018-06-25 ENCOUNTER — Emergency Department (HOSPITAL_COMMUNITY): Payer: Medicare Other

## 2018-06-25 DIAGNOSIS — R079 Chest pain, unspecified: Secondary | ICD-10-CM | POA: Insufficient documentation

## 2018-06-25 DIAGNOSIS — K30 Functional dyspepsia: Secondary | ICD-10-CM

## 2018-06-25 DIAGNOSIS — F909 Attention-deficit hyperactivity disorder, unspecified type: Secondary | ICD-10-CM | POA: Diagnosis not present

## 2018-06-25 DIAGNOSIS — Z7982 Long term (current) use of aspirin: Secondary | ICD-10-CM | POA: Insufficient documentation

## 2018-06-25 DIAGNOSIS — R61 Generalized hyperhidrosis: Secondary | ICD-10-CM | POA: Diagnosis not present

## 2018-06-25 DIAGNOSIS — F17210 Nicotine dependence, cigarettes, uncomplicated: Secondary | ICD-10-CM | POA: Insufficient documentation

## 2018-06-25 DIAGNOSIS — I10 Essential (primary) hypertension: Secondary | ICD-10-CM | POA: Diagnosis not present

## 2018-06-25 DIAGNOSIS — E119 Type 2 diabetes mellitus without complications: Secondary | ICD-10-CM | POA: Insufficient documentation

## 2018-06-25 DIAGNOSIS — Z720 Tobacco use: Secondary | ICD-10-CM

## 2018-06-25 DIAGNOSIS — Z79899 Other long term (current) drug therapy: Secondary | ICD-10-CM | POA: Insufficient documentation

## 2018-06-25 LAB — BASIC METABOLIC PANEL
Anion gap: 10 (ref 5–15)
BUN: 7 mg/dL (ref 6–20)
CO2: 23 mmol/L (ref 22–32)
CREATININE: 0.77 mg/dL (ref 0.44–1.00)
Calcium: 9.3 mg/dL (ref 8.9–10.3)
Chloride: 106 mmol/L (ref 98–111)
GFR calc Af Amer: >60 mL/min (ref >60)
Glucose, Bld: 116 mg/dL — ABNORMAL HIGH (ref 70–99)
POTASSIUM: 3.6 mmol/L (ref 3.5–5.1)
SODIUM: 139 mmol/L (ref 135–145)

## 2018-06-25 LAB — CBC
HEMATOCRIT: 38.8 % (ref 36.0–46.0)
Hemoglobin: 12.9 g/dL (ref 12.0–15.0)
MCH: 28.5 pg (ref 26.0–34.0)
MCHC: 33.2 g/dL (ref 30.0–36.0)
MCV: 85.7 fL (ref 78.0–100.0)
Platelets: 348 10*3/uL (ref 150–400)
RBC: 4.53 MIL/uL (ref 3.87–5.11)
RDW: 13.4 % (ref 11.5–15.5)
WBC: 4.9 10*3/uL (ref 4.0–10.5)

## 2018-06-25 LAB — HEPATIC FUNCTION PANEL
ALBUMIN: 3.7 g/dL (ref 3.5–5.0)
ALT: 18 U/L (ref 0–44)
AST: 21 U/L (ref 15–41)
Alkaline Phosphatase: 73 U/L (ref 38–126)
BILIRUBIN DIRECT: 0.2 mg/dL (ref 0.0–0.2)
BILIRUBIN TOTAL: 0.7 mg/dL (ref 0.3–1.2)
Indirect Bilirubin: 0.5 mg/dL (ref 0.3–0.9)
Total Protein: 7.5 g/dL (ref 6.5–8.1)

## 2018-06-25 LAB — I-STAT TROPONIN, ED
TROPONIN I, POC: 0 ng/mL (ref 0.00–0.08)
Troponin i, poc: 0 ng/mL (ref 0.00–0.08)

## 2018-06-25 LAB — LIPASE, BLOOD: Lipase: 34 U/L (ref 11–51)

## 2018-06-25 MED ORDER — ASPIRIN 81 MG PO CHEW
243.0000 mg | CHEWABLE_TABLET | Freq: Once | ORAL | Status: AC
  Administered 2018-06-25: 243 mg via ORAL
  Filled 2018-06-25: qty 3

## 2018-06-25 MED ORDER — GI COCKTAIL ~~LOC~~
30.0000 mL | Freq: Once | ORAL | Status: AC
  Administered 2018-06-25: 30 mL via ORAL
  Filled 2018-06-25: qty 30

## 2018-06-25 NOTE — ED Provider Notes (Signed)
[facility] EMERGENCY DEPARTMENT Provider Note   CSN: [id number] Arrival date & time: 06/25/18  1022     History   Chief Complaint Chief Complaint  Patient presents with  . Chest Pain    HPI Melissa Pham is a 52 y.o. female with a PMHx of asthma, ADHD, anxiety, depression, DM2, GERD, HTN, HLD, sickle cell trait, and other conditions listed below, who presents to the ED with complaints of CP x4 days.  Patient states that on Sunday, 4 days ago, she started having left-sided chest pain that has been constant, and has had intermittent diaphoresis with activity.  She has been under a lot of stress lately, had 3 deaths of close family members since October, most recently her daughter passed away in a very tragic accident so she has had to get custody of her grandson and is taking care of him now.  She just recently went back to [place], where her daughter passed away, and packed all of her stuff up and thinks that this has contributed to her stress and anxiety (note: the plane ride was very short, states it was split up into two short plane rides).  Since Sunday she has been having CP which she describes as 8/10 constant pulling/squeezing/pressure-like pain in the left side of her chest that radiates into the left arm and neck area, worsens with activity, has been unrelieved with Pepto-Bismol and MiraLAX, and improved somewhat with 2 nitroglycerin which she took on Sunday and Monday but has not taken since then, as well as 81 mg aspirin which she has been taking every day.  She states that she will get diaphoretic intermittently when she does any activity, but this goes away and is not a constant issue.  She states that she feels like she has indigestion and heartburn, states that she feels like she needs to "burp".  She admits to being a cigarette smoker but states that she has cut back recently.  She reports that she has been walking more, eating better, and taking all the medications  as prescribed but she is supposed to be on.  She does not have a cardiologist but she has a PCP which is Dr. Vista Lawman, and he has been managing all of her medical care.  She believes that her current issues are linked to stress and possibly menopause.  Her father had an MI at 76y/o and passed away from this, and her nephew had an MI in his 32s (survived).  Of note, pt was admitted on 12/11/17 for CP and abd discomfort complaints, very similar to today's visit, had Echo which was reassuring, had 3 negative troponins, and had a CT coronaries which showed Coronary artery calcium score 93 suggestive of high risk for future coronary events, and mild stenosis proximal LCx but no obstructive coronary disease; Dr. Radford Pax of cardiology recommended "aggressive risk factor modification including smoking cessation counseling, aggressive treatment of hyperlipidemia, HTN and DM.  Her LDL goal is < 70.  Need to repeat FLP and adjust statin as needed and repeat HbA1C.  No further cardiac workup indicated at this time".  She has been compliant with everything that they sent her home on.     She denies lightheadedness, fevers, chills, cough, SOB, LE swelling, recent prolonged travel/surgery/immobilization, estrogen use, personal/family hx of DVT/PE, abd pain, N/V, worsening diarrhea from baseline, constipation, hematuria, dysuria, myalgias, arthralgias, claudication, orthopnea, numbness, tingling, focal weakness, or any other complaints at this time.  The history is provided by the patient  and medical records. No language interpreter was used.  Chest Pain   Associated symptoms include diaphoresis (intermittently with activity). Pertinent negatives include no abdominal pain, no cough, no fever, no nausea, no numbness, no shortness of breath, no vomiting and no weakness.    Past Medical History:  Diagnosis Date  . ADHD (attention deficit hyperactivity disorder)   . Anxiety   . Asthma   . Bipolar disorder ([place])   .  Blood transfusion without reported diagnosis 1985   after childbirth  . Depression   . Diabetes mellitus without complication ([place])   . GERD (gastroesophageal reflux disease)   . Hyperlipidemia   . Hypertension   . Schizophrenia ([place])   . Sickle cell trait [facility])     Patient Active Problem List   Diagnosis Date Noted  . Pure hypercholesterolemia   . Generalized abdominal discomfort 12/10/2017  . Effusion, right knee 03/13/2017  . Sprain of right knee 03/13/2017  . Chest pain 02/20/2015  . Suprapubic pain 02/20/2015  . Diarrhea 02/20/2015  . Diabetes mellitus, type 2 ([place]) 02/20/2015  . HTN (hypertension) 02/20/2015  . Dyslipidemia 02/20/2015  . Bipolar affective disorder ([place]) 02/20/2015  . Schizophrenia ([place]) 02/20/2015  . Hypokalemia 02/20/2015    Past Surgical History:  Procedure Laterality Date  . CERVICAL BIOPSY  W/ LOOP ELECTRODE EXCISION    . left eye surgery  1970  . TUBAL LIGATION  1989     OB History    Gravida  7   Para      Term      Preterm      AB  3   Living  4     SAB  1   TAB  2   Ectopic      Multiple      Live Births               Home Medications    Prior to Admission medications   Medication Sig Start Date End Date Taking? Authorizing Provider  acetaminophen (TYLENOL) 500 MG tablet Take 1,000 mg by mouth every 6 (six) hours as needed for mild pain.    [provider]  albuterol (PROVENTIL HFA;VENTOLIN HFA) 108 (90 BASE) MCG/ACT inhaler Inhale 2 puffs into the lungs every 6 (six) hours as needed for wheezing or shortness of breath.     [provider]  albuterol (PROVENTIL) (2.5 MG/3ML) 0.083% nebulizer solution Take 2.5 mg by nebulization every 6 (six) hours as needed for wheezing or shortness of breath.    [provider]  aspirin EC 81 MG tablet Take 81 mg by mouth daily.    [provider]  atorvastatin (LIPITOR) 40 MG tablet Take 1 tablet (40 mg total) by mouth daily. 12/13/17 01/12/18   Alma Friendly, MD  benztropine (COGENTIN) 1 MG tablet Take 1 mg by mouth 2 (two) times daily.    [provider]  cyclobenzaprine (FLEXERIL) 5 MG tablet Take 5 mg by mouth 2 (two) times daily as needed for muscle spasms.     [provider]  gabapentin (NEURONTIN) 600 MG tablet Take 100 mg by mouth 3 (three) times daily.     [provider]  hydrOXYzine (ATARAX/VISTARIL) 25 MG tablet Take 25 mg by mouth every 6 (six) hours as needed for itching.     [provider]  Lidocaine-Glycerin (PREPARATION H RE) Place 1 application rectally as needed (hemmroids).    [provider]  lisinopril (PRINIVIL,ZESTRIL) 5 MG tablet Take 5  mg by mouth daily.    [provider]  metoprolol succinate (TOPROL-XL) 50 MG 24 hr tablet Take 50 mg by mouth daily. Take with or immediately following a meal.    [provider]  mometasone (NASONEX) 50 MCG/ACT nasal spray Place 2 sprays into the nose 2 (two) times daily as needed (allergies).     [provider]  Multiple Vitamins-Minerals (MULTIVITAMIN WITH MINERALS) tablet Take 1 tablet by mouth daily.    [provider]  nicotine polacrilex (NICORETTE) 2 MG gum Take 1 each (2 mg total) by mouth as needed for smoking cessation. 12/13/17   Alma Friendly, MD  nitroGLYCERIN (NITROSTAT) 0.4 MG SL tablet Place 0.4 mg under the tongue every 5 (five) minutes as needed for chest pain.    [provider]  omeprazole (PRILOSEC) 20 MG capsule Take 2 capsules (40 mg total) by mouth daily. 02/21/15   Hosie Poisson, MD  PARoxetine (PAXIL) 20 MG tablet Take 20 mg by mouth daily as needed (hotflashes).     [provider]  Pseudoeph-Doxylamine-DM-APAP (NYQUIL PO) Take 2 capsules by mouth as needed (cold symptoms).    [provider]  Pseudoephedrine-APAP-DM (DAYQUIL PO) Take 2 capsules by mouth as needed (cold symptoms).    [provider]  risperiDONE (RISPERDAL) 2  MG tablet Take 2 mg by mouth 2 (two) times daily.    [provider]  simethicone (MYLICON) 80 MG chewable tablet Chew 80 mg by mouth 4 (four) times daily as needed for flatulence.     [provider]  traZODone (DESYREL) 100 MG tablet Take 100-200 mg by mouth at bedtime as needed for sleep.     [provider]  valACYclovir (VALTREX) 1000 MG tablet Take 1,000 mg by mouth daily.    [provider]    Family History Family History  Problem Relation Age of Onset  . Hyperlipidemia Mother   . Arthritis Mother   . Heart attack Father   . Diabetes Father   . Hypertension Father   . Breast cancer Sister   . Cancer Sister        cervical  . Breast cancer Paternal Aunt   . Colon cancer Maternal Grandmother 72  . Esophageal cancer Neg Hx   . Rectal cancer Neg Hx     Social History Social History   Tobacco Use  . Smoking status: Current Every Day Smoker    Packs/day: 0.25    Years: 4.00    Pack years: 1.00    Types: Cigarettes  . Smokeless tobacco: Never Used  Substance Use Topics  . Alcohol use: No    Alcohol/week: 0.0 standard drinks  . Drug use: No    Comment: no drug use for 7 years     Allergies   Sulfa antibiotics   Review of Systems Review of Systems  Constitutional: Positive for diaphoresis (intermittently with activity). Negative for chills and fever.  Respiratory: Negative for cough and shortness of breath.   Cardiovascular: Positive for chest pain. Negative for leg swelling.  Gastrointestinal: Negative for abdominal pain, constipation, diarrhea, nausea and vomiting.       +indigestion/heartburn feeling  Genitourinary: Negative for dysuria and hematuria.  Musculoskeletal: Positive for myalgias (L arm pain from chest). Negative for arthralgias.  Skin: Negative for color change.  Allergic/Immunologic: Positive for immunocompromised state (DM2).  Neurological: Negative for weakness, light-headedness and numbness.    Psychiatric/Behavioral: Negative for confusion.   All other systems reviewed and are negative for  acute change except as noted in the HPI.    Physical Exam Updated Vital Signs BP (!) 145/88 (BP Location: Left Arm)   Pulse 88   Temp 98.4 F (36.9 C) (Oral)   Resp 16   SpO2 100%   Physical Exam  Constitutional: She is oriented to person, place, and time. Vital signs are normal. She appears well-developed and well-nourished.  Non-toxic appearance. No distress.  Afebrile, nontoxic, NAD, very anxious/stressed appearing, tearful at times  HENT:  Head: Normocephalic and atraumatic.  Mouth/Throat: Oropharynx is clear and moist and mucous membranes are normal.  Eyes: Conjunctivae and EOM are normal. Right eye exhibits no discharge. Left eye exhibits no discharge.  Neck: Normal range of motion. Neck supple.  Cardiovascular: Normal rate, regular rhythm, normal heart sounds and intact distal pulses. Exam reveals no gallop and no friction rub.  No murmur heard. RRR, nl s1/s2, no m/r/g, distal pulses intact, no pedal edema   Pulmonary/Chest: Effort normal and breath sounds normal. No respiratory distress. She has no decreased breath sounds. She has no wheezes. She has no rhonchi. She has no rales. She exhibits tenderness. She exhibits no crepitus, no deformity and no retraction.  CTAB in all lung fields, no w/r/r, no hypoxia or increased WOB, speaking in full sentences, SpO2 100% on RA Chest wall with mild TTP to L anterior chest, without crepitus, deformities, or retractions   Abdominal: Soft. Normal appearance and bowel sounds are normal. She exhibits no distension. There is tenderness in the epigastric area. There is no rigidity, no rebound, no guarding, no CVA tenderness, no tenderness at McBurney's point and negative Murphy's sign.  Soft, nondistended, +BS throughout, with mild epigastric TTP, no r/g/r, neg murphy's, neg mcburney's, no CVA TTP   Musculoskeletal: Normal range of motion.  MAE  x4 Strength and sensation grossly intact in all extremities Distal pulses intact No pedal edema, neg homan's bilaterally   Neurological: She is alert and oriented to person, place, and time. She has normal strength. No sensory deficit.  Skin: Skin is warm, dry and intact. No rash noted.  Psychiatric: Her mood appears anxious.  Anxious/stressed appearing, tearful at times  Nursing note and vitals reviewed.    ED Treatments / Results  Labs (all labs ordered are listed, but only abnormal results are displayed) Labs Reviewed  BASIC METABOLIC PANEL - Abnormal; Notable for the following components:      Result Value   Glucose, Bld 116 (*)    All other components within normal limits  CBC  HEPATIC FUNCTION PANEL  LIPASE, BLOOD  I-STAT TROPONIN, ED  I-STAT TROPONIN, ED    EKG EKG Interpretation  Date/Time:  Thursday June 25 2018 10:31:56 EDT Ventricular Rate:  76 PR Interval:  152 QRS Duration: 62 QT Interval:  392 QTC Calculation: 441 R Axis:   73 Text Interpretation:  Normal sinus rhythm Normal ECG No significant change since last tracing Confirmed by Kirk Ruths [phone]) on 06/25/2018 3:22:52 PM   Radiology Dg Chest 2 View  Result Date: 06/25/2018 CLINICAL DATA:  Chest pain EXAM: CHEST - 2 VIEW COMPARISON:  12/10/2017 chest radiograph. FINDINGS: Stable cardiomediastinal silhouette with normal heart size. No pneumothorax. No pleural effusion. Lungs appear clear, with no acute consolidative airspace disease and no pulmonary edema. IMPRESSION: No active cardiopulmonary disease. Electronically Signed   By: Ilona Sorrel M.D.   On: 06/25/2018 11:15    Echo 12/11/17: Study Conclusions - Left ventricle: The cavity size was normal. There was mild   concentric  hypertrophy. Systolic function was normal. The   estimated ejection fraction was in the range of 55% to 60%. Wall   motion was normal; there were no regional wall motion   abnormalities. - Mitral valve: Valve area by  pressure half-time: 1.85 cm^2.   CT coronary 12/12/17: IMPRESSION: 1. Coronary artery calcium score 93 Agatston units. This places the patient in the 91st percentile for age and gender, suggesting high risk for future cardiac events.  2. Mild stenosis proximal LCx. No obstructive coronary disease noted.  Dalton Mclean Electronically Signed   By: Loralie Champagne M.D.   On: 12/12/2017 17:21   Procedures Procedures (including critical care time)  Medications Ordered in ED Medications  aspirin chewable tablet 243 mg (243 mg Oral Given 06/25/18 1651)  gi cocktail (Maalox,Lidocaine,Donnatal) (30 mLs Oral Given 06/25/18 1652)     Initial Impression / Assessment and Plan / ED Course  I have reviewed the triage vital signs and the nursing notes.  Pertinent labs & imaging results that were available during my care of the patient were reviewed by me and considered in my medical decision making (see chart for details).     52 y.o. female here with CP x4 days and diaphoresis intermittently with activity. States it feels like a pressure/pulling/squeezing on the left side of her chest. Has indigestion feeling as well. Has had a lot of stress recently, 3 family members died in the last yr, her daughter just recently died in a tragic accident. On exam, pt appears very stressed/anxious, tearful at times, with reproducible chest wall TTP on L anterior chest region, mild epigastric TTP but nonperitoneal and neg murphy's, no pedal edema, no tachycardia or hypoxia, neg homan's bilaterally, clear lung exam. Work up thus far reveals: EKG nonischemic and fairly unremarkable; BMP unremarkable; CBC WNL; CXR neg. She was recently admitted for very similar symptoms in 12/2017, underwent echo which was unremarkable, had CT coronaries which showed high risk calcium score and mild stenosis in proximal LCx, they recommended aggressive management of her risk factors at that time, cardiology did not feel further  cardiac work up was needed and signed off. I suspect stress is a large factor in her symptoms, in addition to indigestion, however she does have a high HEART score due to risk factors. Doubt dissection/PE. Will get LFTs/lipase, repeat troponin, give ASA and GI cocktail, and then reassess; will likely touch base with cardiology and see if they recommend further outpatient work up given her recurrent CP and high risk factors. Will reassess shortly.   6:22 PM Lipase WNL. LFTs WNL. Second trop neg. Pt feeling better somewhat better with regards to indigestion, but still having some CP into her arm, however on the phone and appears well. Will touch base with cardiology to see if they feel she needs admission or if they think she could f/up outpatient given her reassuring work up today. Discussed case with my attending Dr. Vanita Panda who agrees with plan.   6:39 PM Dr. Harl Bowie of cardiology returning page, reviewed chart and EKG, agrees that she has a reassuring work up today and that there is likely nothing else to be done in the hospital at this point, feels comfortable that outpatient f/up would be reasonable. He will have his PA arrange for f/up, I will also give her the information for their office and advised her to call if she hadn't heard back from them. Discussed that stress is likely a large component of her symptoms, in combination with possible indigestion  and maybe musculoskeletal component as well. Advised OTC remedies for symptomatic relief, discussed diet/lifestyle modifications for GERD/indigestion, and discussed f/up with cardiology in the next 1 week for recheck of symptoms and ongoing evaluation of her chest pain. Smoking cessation strongly encouraged as well. Advised continuation of all her current medications as well, in order to continue aggressive management of her risk factors. Strict return precautions were discussed, but again I don't think she needs admission at this time.  I explained the  diagnosis and have given explicit precautions to return to the ER including for any other new or worsening symptoms. The patient understands and accepts the medical plan as it's been dictated and I have answered their questions. Discharge instructions concerning home care and prescriptions have been given. The patient is STABLE and is discharged to home in good condition.    Final Clinical Impressions(s) / ED Diagnoses   Final diagnoses:  Left-sided chest pain  Diaphoresis  Indigestion  Tobacco user    ED Discharge Orders    [address] [place], [place] 06/25/18 1846    Carmin Muskrat, MD 06/25/18 2216

## 2018-06-25 NOTE — ED Triage Notes (Signed)
Pt reports she has been having chest pain X4 days. She reports at time it is severe and she becomes diaphoretic. Pt alert and oriented, no distress at this time.

## 2018-06-25 NOTE — ED Notes (Signed)
Upon checking pts vitals, this tech noticed that the pt was rocking back and forth. When asked why, pt stated, "I'm in pain." This tech asked if her pain had increased since she was triaged. She stated yes to about an "8." RN, Lexine Baton, was made aware. Pt is now laying on one of the benches in the waiting room resting.

## 2018-06-25 NOTE — ED Notes (Signed)
Pt stable, ambulatory, states understanding of discharge instructions 

## 2018-06-25 NOTE — Discharge Instructions (Signed)
Your labs, xray, and EKG are all reassuring, your chest pain could be a variety of things including stress related pain, gas pain, muscle pain, indigestion, and other nonemergent issues. You do have a lot of risk factors for heart problems, but again your work up today has been very reassuring. We spoke with the cardiology service today and they felt it was reasonable to have you follow up with them as an outpatient and they will arrange any further testing/evaluation outpatient that may need to be done; they will be contacting you soon to arrange this, however if you haven't heard from them by tomorrow afternoon you can call them to set up an appointment.  Alternate between tylenol and motrin as directed as needed for pain, always taking these on a full stomach and NEVER on an empty stomach. Stay well hydrated. You may consider using heat to the areas of pain, no more than 20 minutes every hour. If you think indigestion could be contributing to your symptoms, you may use over the counter tums, maalox, pepto bismol, zantac, etc to help with symptoms; avoid spicy/fatty/fried/acidic foods. STOP SMOKING! Continue taking all of your usual home medications.  Follow up with the cardiology team in then next 1 week for recheck of symptoms and ongoing management of your chest pain. Return to the ER for changes or worsening symptoms.  SEEK IMMEDIATE MEDICAL ATTENTION IF: You develop a fever.  Your chest pains become severe or intolerable.  You develop new, unexplained symptoms (problems).  You develop shortness of breath, nausea, vomiting, sweating or feel light headed.  You develop a new cough or you cough up blood. You develop new leg swelling

## 2018-07-23 ENCOUNTER — Other Ambulatory Visit: Payer: Self-pay | Admitting: Internal Medicine

## 2018-07-23 DIAGNOSIS — Z1231 Encounter for screening mammogram for malignant neoplasm of breast: Secondary | ICD-10-CM

## 2018-07-27 DIAGNOSIS — Z720 Tobacco use: Secondary | ICD-10-CM | POA: Insufficient documentation

## 2018-07-27 NOTE — Progress Notes (Deleted)
Cardiology Office Note    Date:  07/27/2018   ID:  Melissa Pham, DOB 11-09-1965, MRN [id number]  PCP:  Benito Mccreedy, MD  Cardiologist: Fransico Him, MD EPS: None  No chief complaint on file.   History of Present Illness:  Melissa Pham is a 52 y.o. female with history of hypertension, type II DM, HLD, tobacco abuse, family history of CAD.  She was admitted to the hospital 12/2017 chest and abdominal pain.  She ruled out for an MI.  Coronary CTA was done with a calcium score of 93, mild stenosis in the proximal circumflex, no obstructive coronary disease noted.  Aggressive risk factor modification including smoking cessation, statin good hypertension and diabetes control were recommended.  2D echo showed normal LVEF 55 to 60% with mild LVH.  Patient was in the emergency room 06/25/2018 with chest pain.  She was under a lot of stress with 3 family members dying including her daughter in a tragic accident and she was having to take care of her grandson.  EKG was unchanged, troponins negative and other labs normal.  Past Medical History:  Diagnosis Date  . ADHD (attention deficit hyperactivity disorder)   . Anxiety   . Asthma   . Bipolar disorder ([place])   . Blood transfusion without reported diagnosis 1985   after childbirth  . Depression   . Diabetes mellitus without complication ([place])   . GERD (gastroesophageal reflux disease)   . Hyperlipidemia   . Hypertension   . Schizophrenia ([place])   . Sickle cell trait [facility])     Past Surgical History:  Procedure Laterality Date  . CERVICAL BIOPSY  W/ LOOP ELECTRODE EXCISION    . left eye surgery  1970  . TUBAL LIGATION  1989    Current Medications: No outpatient medications have been marked as taking for the 07/28/18 encounter (Appointment) with Imogene Burn, PA-C.     Allergies:   Sulfa antibiotics   Social History   Socioeconomic History  . Marital status: Single    Spouse name: Not on file  . Number of children:  Not on file  . Years of education: Not on file  . Highest education level: Not on file  Occupational History  . Not on file  Social Needs  . Financial resource strain: Not on file  . Food insecurity:    Worry: Not on file    Inability: Not on file  . Transportation needs:    Medical: Not on file    Non-medical: Not on file  Tobacco Use  . Smoking status: Current Every Day Smoker    Packs/day: 0.25    Years: 4.00    Pack years: 1.00    Types: Cigarettes  . Smokeless tobacco: Never Used  Substance and Sexual Activity  . Alcohol use: No    Alcohol/week: 0.0 standard drinks  . Drug use: No    Comment: no drug use for 7 years  . Sexual activity: Not Currently    Birth control/protection: Surgical  Lifestyle  . Physical activity:    Days per week: Not on file    Minutes per session: Not on file  . Stress: Not on file  Relationships  . Social connections:    Talks on phone: Not on file    Gets together: Not on file    Attends religious service: Not on file    Active member of club or organization: Not on file    Attends meetings of clubs or organizations: Not  on file    Relationship status: Not on file  Other Topics Concern  . Not on file  Social History Narrative  . Not on file     Family History:  The patient's ***family history includes Arthritis in her mother; Breast cancer in her paternal aunt and sister; Cancer in her sister; Colon cancer (age of onset: 35) in her maternal grandmother; Diabetes in her father; Heart attack in her father; Hyperlipidemia in her mother; Hypertension in her father.   ROS:   Please see the history of present illness.    ROS All other systems reviewed and are negative.   PHYSICAL EXAM:   VS:  There were no vitals taken for this visit.  Physical Exam  GEN: Well nourished, well developed, in no acute distress  HEENT: normal  Neck: no JVD, carotid bruits, or masses Cardiac:RRR; no murmurs, rubs, or gallops  Respiratory:  clear to  auscultation bilaterally, normal work of breathing GI: soft, nontender, nondistended, + BS Ext: without cyanosis, clubbing, or edema, Good distal pulses bilaterally MS: no deformity or atrophy  Skin: warm and dry, no rash Neuro:  Alert and Oriented x 3, Strength and sensation are intact Psych: euthymic mood, full affect  Wt Readings from Last 3 Encounters:  06/25/18 182 lb (82.6 kg)  12/13/17 181 lb 8 oz (82.3 kg)  07/13/15 198 lb (89.8 kg)      Studies/Labs Reviewed:   EKG:  EKG is*** ordered today.  The ekg ordered today demonstrates ***  Recent Labs: 06/25/2018: ALT 18; BUN 7; Creatinine, Ser 0.77; Hemoglobin 12.9; Platelets 348; Potassium 3.6; Sodium 139   Lipid Panel    Component Value Date/Time   CHOL 201 (H) 12/13/2017 0344   TRIG 133 12/13/2017 0344   HDL 48 12/13/2017 0344   CHOLHDL 4.2 12/13/2017 0344   VLDL 27 12/13/2017 0344   LDLCALC 126 (H) 12/13/2017 0344    Additional studies/ records that were reviewed today include:  Echo 12/11/17: Study Conclusions - Left ventricle: The cavity size was normal. There was mild   concentric hypertrophy. Systolic function was normal. The   estimated ejection fraction was in the range of 55% to 60%. Wall   motion was normal; there were no regional wall motion   abnormalities. - Mitral valve: Valve area by pressure half-time: 1.85 cm^2.     CT coronary 12/12/17: IMPRESSION: 1. Coronary artery calcium score 93 Agatston units. This places the patient in the 91st percentile for age and gender, suggesting high risk for future cardiac events.   2. Mild stenosis proximal LCx. No obstructive coronary disease noted.   Dalton Mclean  Electronically Signed   By: Loralie Champagne M.D.   On: 12/12/2017 17:21     Coronary CTA showed the following FINDINGS: Non-cardiac: See separate report from [facility].   Calcium Score: 33 Agatston units   Coronary Arteries: Right dominant with no anomalies   LM: No plaque or  stenosis.   LAD system: No plaque or stenosis.   Circumflex system: Mixed plaque with mild stenosis in the ostial LCx. No other significant disease in the LCx system.   RCA system: No plaque or stenosis.   IMPRESSION: 1. Coronary artery calcium score 93 Agatston units. This places the patient in the 91st percentile for age and gender, suggesting high risk for future cardiac events.   2. Mild stenosis proximal LCx. No obstructive coronary disease Noted.   Patient needs aggressive risk factor modification including smoking cessation counseling, aggressive treatment of  hyperlipidemia, HTN and DM.  Her LDL goal is < 70.  Need to repeat FLP and adjust statin as needed and repeat HbA1C.  No further cardiac workup indicated at this time.  Will sign of.  Call with any questions.    Results were discussed with patient,    Electronically signed by Sueanne Margarita, MD at 12/12/2017  5:48 PM Electronically signed by Sueanne Margarita, MD at 12/12/2017  5:50 PM      ASSESSMENT:    1. Other chest pain   2. Essential hypertension   3. Dyslipidemia   4. Tobacco abuse      PLAN:  In order of problems listed above:  Chest pain you prompting ER visit 06/2018 felt secondary to stress.  Troponins negative and EKG unchanged.  Coronary CT 12/2016 calcium score of 93 with mild stenosis in the circumflex without obstruction.  Aggressive risk factor modification recommended  Essential hypertension  Dyslipidemia  Tobacco abuse  Diabetes mellitus    Medication Adjustments/Labs and Tests Ordered: Current medicines are reviewed at length with the patient today.  Concerns regarding medicines are outlined above.  Medication changes, Labs and Tests ordered today are listed in the Patient Instructions below. There are no Patient Instructions on file for this visit.   Sumner Boast, PA-C  07/27/2018 3:27 PM    [facility] [place], [place], [place]  [address] Phone:  [phone]; Fax: [phone]

## 2018-07-28 ENCOUNTER — Ambulatory Visit: Payer: Medicare Other | Admitting: Physician Assistant

## 2018-07-30 ENCOUNTER — Encounter: Payer: Self-pay | Admitting: Physician Assistant

## 2018-08-04 DIAGNOSIS — Z0001 Encounter for general adult medical examination with abnormal findings: Secondary | ICD-10-CM | POA: Diagnosis not present

## 2018-08-04 DIAGNOSIS — I10 Essential (primary) hypertension: Secondary | ICD-10-CM | POA: Diagnosis not present

## 2018-08-11 DIAGNOSIS — E1140 Type 2 diabetes mellitus with diabetic neuropathy, unspecified: Secondary | ICD-10-CM | POA: Diagnosis not present

## 2018-08-11 DIAGNOSIS — F319 Bipolar disorder, unspecified: Secondary | ICD-10-CM | POA: Diagnosis not present

## 2018-08-11 DIAGNOSIS — Z0110 Encounter for examination of ears and hearing without abnormal findings: Secondary | ICD-10-CM | POA: Diagnosis not present

## 2018-08-11 DIAGNOSIS — F209 Schizophrenia, unspecified: Secondary | ICD-10-CM | POA: Diagnosis not present

## 2018-08-11 DIAGNOSIS — E785 Hyperlipidemia, unspecified: Secondary | ICD-10-CM | POA: Diagnosis not present

## 2018-08-11 DIAGNOSIS — M1990 Unspecified osteoarthritis, unspecified site: Secondary | ICD-10-CM | POA: Diagnosis not present

## 2018-08-11 DIAGNOSIS — K219 Gastro-esophageal reflux disease without esophagitis: Secondary | ICD-10-CM | POA: Diagnosis not present

## 2018-08-11 DIAGNOSIS — Z124 Encounter for screening for malignant neoplasm of cervix: Secondary | ICD-10-CM | POA: Diagnosis not present

## 2018-08-11 DIAGNOSIS — I10 Essential (primary) hypertension: Secondary | ICD-10-CM | POA: Diagnosis not present

## 2018-08-11 DIAGNOSIS — Z87891 Personal history of nicotine dependence: Secondary | ICD-10-CM | POA: Diagnosis not present

## 2018-08-11 DIAGNOSIS — J45909 Unspecified asthma, uncomplicated: Secondary | ICD-10-CM | POA: Diagnosis not present

## 2018-08-11 DIAGNOSIS — E119 Type 2 diabetes mellitus without complications: Secondary | ICD-10-CM | POA: Diagnosis not present

## 2018-08-25 ENCOUNTER — Ambulatory Visit
Admission: RE | Admit: 2018-08-25 | Discharge: 2018-08-25 | Disposition: A | Discharge status: Home / Self Care | Payer: Medicare Other | Source: Ambulatory Visit | Attending: Internal Medicine | Admitting: Internal Medicine

## 2018-08-25 DIAGNOSIS — Z1231 Encounter for screening mammogram for malignant neoplasm of breast: Secondary | ICD-10-CM | POA: Diagnosis not present

## 2018-08-27 DIAGNOSIS — Z23 Encounter for immunization: Secondary | ICD-10-CM | POA: Diagnosis not present

## 2018-09-01 NOTE — Progress Notes (Deleted)
Cardiology Office Note    Date:  09/01/2018   ID:  Melissa Pham, DOB [birth year], MRN [id number]  PCP:  Benito Mccreedy, MD  Cardiologist: Fransico Him, MD EPS: None  No chief complaint on file.   History of Present Illness:  Melissa Pham is a 52 y.o. female Patient was seen by Dr. Radford Pax in the hospital 12/12/2017 for complaints of chest pain.  Cardiac risk factors include hypertension, HLD, DM type II, tobacco abuse and family history of CAD.  Troponins were negative x3 EKG no acute change and echo showed normal LV function with no wall motion abnormalities.  Had a previous NST had attenuation artifact.  Coronary CTA showed calcium score 33 in the impression said calcium score of 93 placed in the patient in the 91st percentile suggesting high risk for future cardiac events.  And mild stenosis in the proximal circumflex, no obstructive coronary disease noted.  Control risk factors with aggressive treatment and smoking cessation recommended.  Patient back in the ER 06/25/2018 with chest pain for 4 days that was constant.  She was under a lot of stress because her daughter had just died in [place] and she gone down to pack up her things.  Urine is negative.  Dr. Harl Bowie reviewed things and felt it was reasonable for her to have outpatient follow-up.  Past Medical History:  Diagnosis Date  . ADHD (attention deficit hyperactivity disorder)   . Anxiety   . Asthma   . Bipolar disorder ([place])   . Blood transfusion without reported diagnosis 1985   after childbirth  . Depression   . Diabetes mellitus without complication ([place])   . GERD (gastroesophageal reflux disease)   . Hyperlipidemia   . Hypertension   . Schizophrenia ([place])   . Sickle cell trait [facility]     Past Surgical History:  Procedure Laterality Date  . CERVICAL BIOPSY  W/ LOOP ELECTRODE EXCISION    . left eye surgery  1970  . TUBAL LIGATION  1989    Current Medications: No outpatient medications have been marked as  taking for the 09/02/18 encounter (Appointment) with Imogene Burn, PA-C.     Allergies:   Sulfa antibiotics   Social History   Socioeconomic History  . Marital status: Single    Spouse name: Not on file  . Number of children: Not on file  . Years of education: Not on file  . Highest education level: Not on file  Occupational History  . Not on file  Social Needs  . Financial resource strain: Not on file  . Food insecurity:    Worry: Not on file    Inability: Not on file  . Transportation needs:    Medical: Not on file    Non-medical: Not on file  Tobacco Use  . Smoking status: Current Every Day Smoker    Packs/day: 0.25    Years: 4.00    Pack years: 1.00    Types: Cigarettes  . Smokeless tobacco: Never Used  Substance and Sexual Activity  . Alcohol use: No    Alcohol/week: 0.0 standard drinks  . Drug use: No    Comment: no drug use for 7 years  . Sexual activity: Not Currently    Birth control/protection: Surgical  Lifestyle  . Physical activity:    Days per week: Not on file    Minutes per session: Not on file  . Stress: Not on file  Relationships  . Social connections:    Talks on phone: Not  on file    Gets together: Not on file    Attends religious service: Not on file    Active member of club or organization: Not on file    Attends meetings of clubs or organizations: Not on file    Relationship status: Not on file  Other Topics Concern  . Not on file  Social History Narrative  . Not on file     Family History:  The patient's ***family history includes Arthritis in her mother; Breast cancer in her paternal aunt and sister; Cancer in her sister; Colon cancer (age of onset: 107) in her maternal grandmother; Diabetes in her father; Heart attack in her father; Hyperlipidemia in her mother; Hypertension in her father.   ROS:   Please see the history of present illness.    ROS All other systems reviewed and are negative.   PHYSICAL EXAM:   VS:  There  were no vitals taken for this visit.  Physical Exam  GEN: Well nourished, well developed, in no acute distress  HEENT: normal  Neck: no JVD, carotid bruits, or masses Cardiac:RRR; no murmurs, rubs, or gallops  Respiratory:  clear to auscultation bilaterally, normal work of breathing GI: soft, nontender, nondistended, + BS Ext: without cyanosis, clubbing, or edema, Good distal pulses bilaterally MS: no deformity or atrophy  Skin: warm and dry, no rash Neuro:  Alert and Oriented x 3, Strength and sensation are intact Psych: euthymic mood, full affect  Wt Readings from Last 3 Encounters:  06/25/18 182 lb (82.6 kg)  12/13/17 181 lb 8 oz (82.3 kg)  07/13/15 198 lb (89.8 kg)      Studies/Labs Reviewed:   EKG:  EKG is*** ordered today.  The ekg ordered today demonstrates ***  Recent Labs: 06/25/2018: ALT 18; BUN 7; Creatinine, Ser 0.77; Hemoglobin 12.9; Platelets 348; Potassium 3.6; Sodium 139   Lipid Panel    Component Value Date/Time   CHOL 201 (H) 12/13/2017 0344   TRIG 133 12/13/2017 0344   HDL 48 12/13/2017 0344   CHOLHDL 4.2 12/13/2017 0344   VLDL 27 12/13/2017 0344   LDLCALC 126 (H) 12/13/2017 0344    Additional studies/ records that were reviewed today include:  Echo 12/11/17: Study Conclusions - Left ventricle: The cavity size was normal. There was mild   concentric hypertrophy. Systolic function was normal. The   estimated ejection fraction was in the range of 55% to 60%. Wall   motion was normal; there were no regional wall motion   abnormalities. - Mitral valve: Valve area by pressure half-time: 1.85 cm^2.     CT coronary 12/12/17: IMPRESSION: 1. Coronary artery calcium score 93 Agatston units. This places the patient in the 91st percentile for age and gender, suggesting high risk for future cardiac events.   2. Mild stenosis proximal LCx. No obstructive coronary disease noted.   Dalton Mclean  Electronically Signed   By: Loralie Champagne M.D.   On:  12/12/2017 17:21       ASSESSMENT:    1. Other chest pain   2. Essential hypertension   3. Dyslipidemia   4. Tobacco abuse   5. Type 2 diabetes mellitus without complication, without long-term current use of insulin (HCC)      PLAN:  In order of problems listed above:  Chest pain prompting ER visit 06/2018 felt secondary to stress.  Troponins negative and EKG unchanged.  Coronary CT 12/2016 calcium score of 19 with mild stenosis in the circumflex without obstruction.  Aggressive risk  factor modification recommended.  Essential hypertension  Dyslipidemia  Tobacco abuse  Diabetes mellitus    Medication Adjustments/Labs and Tests Ordered: Current medicines are reviewed at length with the patient today.  Concerns regarding medicines are outlined above.  Medication changes, Labs and Tests ordered today are listed in the Patient Instructions below. There are no Patient Instructions on file for this visit.   Signed, Ermalinda Barrios, PA-C  09/01/2018 2:17 PM    [facility] [place], [place], [place]  [address] Phone: [phone]; Fax: [phone]

## 2018-09-02 ENCOUNTER — Ambulatory Visit: Payer: Medicare Other | Admitting: Physician Assistant

## 2018-09-03 ENCOUNTER — Encounter: Payer: Self-pay | Admitting: Physician Assistant

## 2018-10-13 DIAGNOSIS — K219 Gastro-esophageal reflux disease without esophagitis: Secondary | ICD-10-CM | POA: Diagnosis not present

## 2018-10-13 DIAGNOSIS — R05 Cough: Secondary | ICD-10-CM | POA: Diagnosis not present

## 2018-10-13 DIAGNOSIS — Z87891 Personal history of nicotine dependence: Secondary | ICD-10-CM | POA: Diagnosis not present

## 2018-10-13 DIAGNOSIS — J45909 Unspecified asthma, uncomplicated: Secondary | ICD-10-CM | POA: Diagnosis not present

## 2018-10-13 DIAGNOSIS — M1990 Unspecified osteoarthritis, unspecified site: Secondary | ICD-10-CM | POA: Diagnosis not present

## 2018-10-13 DIAGNOSIS — E782 Mixed hyperlipidemia: Secondary | ICD-10-CM | POA: Diagnosis not present

## 2018-10-13 DIAGNOSIS — E1140 Type 2 diabetes mellitus with diabetic neuropathy, unspecified: Secondary | ICD-10-CM | POA: Diagnosis not present

## 2018-10-13 DIAGNOSIS — E119 Type 2 diabetes mellitus without complications: Secondary | ICD-10-CM | POA: Diagnosis not present

## 2018-11-12 DIAGNOSIS — E119 Type 2 diabetes mellitus without complications: Secondary | ICD-10-CM | POA: Diagnosis not present

## 2018-11-12 DIAGNOSIS — R1013 Epigastric pain: Secondary | ICD-10-CM | POA: Diagnosis not present

## 2018-11-12 DIAGNOSIS — E782 Mixed hyperlipidemia: Secondary | ICD-10-CM | POA: Diagnosis not present

## 2018-11-12 DIAGNOSIS — K219 Gastro-esophageal reflux disease without esophagitis: Secondary | ICD-10-CM | POA: Diagnosis not present

## 2018-11-12 DIAGNOSIS — M1990 Unspecified osteoarthritis, unspecified site: Secondary | ICD-10-CM | POA: Diagnosis not present

## 2018-11-12 DIAGNOSIS — E1140 Type 2 diabetes mellitus with diabetic neuropathy, unspecified: Secondary | ICD-10-CM | POA: Diagnosis not present

## 2018-11-12 DIAGNOSIS — J45909 Unspecified asthma, uncomplicated: Secondary | ICD-10-CM | POA: Diagnosis not present

## 2018-11-12 DIAGNOSIS — Z87891 Personal history of nicotine dependence: Secondary | ICD-10-CM | POA: Diagnosis not present

## 2018-12-06 ENCOUNTER — Observation Stay (HOSPITAL_COMMUNITY)
Admission: EM | Admit: 2018-12-06 | Discharge: 2018-12-07 | Disposition: A | Discharge status: Home / Self Care | Payer: Medicare Other | Attending: Internal Medicine | Admitting: Internal Medicine

## 2018-12-06 ENCOUNTER — Emergency Department (HOSPITAL_COMMUNITY): Payer: Medicare Other

## 2018-12-06 ENCOUNTER — Encounter (HOSPITAL_COMMUNITY): Payer: Self-pay

## 2018-12-06 ENCOUNTER — Other Ambulatory Visit: Payer: Self-pay

## 2018-12-06 DIAGNOSIS — E119 Type 2 diabetes mellitus without complications: Secondary | ICD-10-CM | POA: Diagnosis not present

## 2018-12-06 DIAGNOSIS — R0789 Other chest pain: Secondary | ICD-10-CM | POA: Diagnosis not present

## 2018-12-06 DIAGNOSIS — F909 Attention-deficit hyperactivity disorder, unspecified type: Secondary | ICD-10-CM | POA: Insufficient documentation

## 2018-12-06 DIAGNOSIS — I25110 Atherosclerotic heart disease of native coronary artery with unstable angina pectoris: Secondary | ICD-10-CM | POA: Diagnosis not present

## 2018-12-06 DIAGNOSIS — M25571 Pain in right ankle and joints of right foot: Secondary | ICD-10-CM | POA: Diagnosis not present

## 2018-12-06 DIAGNOSIS — Z7982 Long term (current) use of aspirin: Secondary | ICD-10-CM | POA: Diagnosis not present

## 2018-12-06 DIAGNOSIS — I2583 Coronary atherosclerosis due to lipid rich plaque: Secondary | ICD-10-CM

## 2018-12-06 DIAGNOSIS — R51 Headache: Secondary | ICD-10-CM | POA: Diagnosis not present

## 2018-12-06 DIAGNOSIS — F319 Bipolar disorder, unspecified: Secondary | ICD-10-CM | POA: Diagnosis not present

## 2018-12-06 DIAGNOSIS — J45909 Unspecified asthma, uncomplicated: Secondary | ICD-10-CM | POA: Diagnosis not present

## 2018-12-06 DIAGNOSIS — R55 Syncope and collapse: Secondary | ICD-10-CM | POA: Diagnosis not present

## 2018-12-06 DIAGNOSIS — K219 Gastro-esophageal reflux disease without esophagitis: Secondary | ICD-10-CM | POA: Diagnosis not present

## 2018-12-06 DIAGNOSIS — F17210 Nicotine dependence, cigarettes, uncomplicated: Secondary | ICD-10-CM | POA: Insufficient documentation

## 2018-12-06 DIAGNOSIS — Z882 Allergy status to sulfonamides status: Secondary | ICD-10-CM | POA: Insufficient documentation

## 2018-12-06 DIAGNOSIS — Z79899 Other long term (current) drug therapy: Secondary | ICD-10-CM | POA: Diagnosis not present

## 2018-12-06 DIAGNOSIS — I200 Unstable angina: Secondary | ICD-10-CM | POA: Diagnosis not present

## 2018-12-06 DIAGNOSIS — E7800 Pure hypercholesterolemia, unspecified: Secondary | ICD-10-CM | POA: Diagnosis not present

## 2018-12-06 DIAGNOSIS — F419 Anxiety disorder, unspecified: Secondary | ICD-10-CM | POA: Diagnosis not present

## 2018-12-06 DIAGNOSIS — S0990XA Unspecified injury of head, initial encounter: Secondary | ICD-10-CM | POA: Diagnosis not present

## 2018-12-06 DIAGNOSIS — D573 Sickle-cell trait: Secondary | ICD-10-CM | POA: Diagnosis not present

## 2018-12-06 DIAGNOSIS — I10 Essential (primary) hypertension: Secondary | ICD-10-CM | POA: Insufficient documentation

## 2018-12-06 DIAGNOSIS — I2510 Atherosclerotic heart disease of native coronary artery without angina pectoris: Secondary | ICD-10-CM | POA: Diagnosis not present

## 2018-12-06 DIAGNOSIS — R079 Chest pain, unspecified: Secondary | ICD-10-CM | POA: Diagnosis not present

## 2018-12-06 DIAGNOSIS — E785 Hyperlipidemia, unspecified: Secondary | ICD-10-CM | POA: Diagnosis not present

## 2018-12-06 DIAGNOSIS — S199XXA Unspecified injury of neck, initial encounter: Secondary | ICD-10-CM | POA: Diagnosis not present

## 2018-12-06 DIAGNOSIS — F209 Schizophrenia, unspecified: Secondary | ICD-10-CM | POA: Diagnosis not present

## 2018-12-06 DIAGNOSIS — R4020 Unspecified coma: Secondary | ICD-10-CM | POA: Diagnosis not present

## 2018-12-06 DIAGNOSIS — M25519 Pain in unspecified shoulder: Secondary | ICD-10-CM | POA: Diagnosis not present

## 2018-12-06 LAB — CBC
HCT: 32 % — ABNORMAL LOW (ref 36.0–46.0)
HEMATOCRIT: 33.7 % — AB (ref 36.0–46.0)
Hemoglobin: 10.9 g/dL — ABNORMAL LOW (ref 12.0–15.0)
Hemoglobin: 10.6 g/dL — ABNORMAL LOW (ref 12.0–15.0)
MCH: 27.7 pg (ref 26.0–34.0)
MCH: 28.3 pg (ref 26.0–34.0)
MCHC: 32.3 g/dL (ref 30.0–36.0)
MCHC: 33.1 g/dL (ref 30.0–36.0)
MCV: 85.8 fL (ref 80.0–100.0)
MCV: 85.6 fL (ref 80.0–100.0)
PLATELETS: 272 10*3/uL (ref 150–400)
Platelets: 253 10*3/uL (ref 150–400)
RBC: 3.93 MIL/uL (ref 3.87–5.11)
RBC: 3.74 MIL/uL — ABNORMAL LOW (ref 3.87–5.11)
RDW: 13.6 % (ref 11.5–15.5)
RDW: 13.7 % (ref 11.5–15.5)
WBC: 5.6 10*3/uL (ref 4.0–10.5)
WBC: 6.5 10*3/uL (ref 4.0–10.5)
nRBC: 0 % (ref 0.0–0.2)
nRBC: 0 % (ref 0.0–0.2)

## 2018-12-06 LAB — BASIC METABOLIC PANEL
Anion gap: 8 (ref 5–15)
BUN: 11 mg/dL (ref 6–20)
CALCIUM: 9 mg/dL (ref 8.9–10.3)
CO2: 21 mmol/L — AB (ref 22–32)
Chloride: 112 mmol/L — ABNORMAL HIGH (ref 98–111)
Creatinine, Ser: 0.62 mg/dL (ref 0.44–1.00)
GFR calc Af Amer: >60 mL/min (ref >60)
GLUCOSE: 128 mg/dL — AB (ref 70–99)
Potassium: 3.6 mmol/L (ref 3.5–5.1)
Sodium: 141 mmol/L (ref 135–145)

## 2018-12-06 LAB — TROPONIN I
Troponin I: <0.03 ng/mL (ref <0.03)
Troponin I: <0.03 ng/mL (ref <0.03)

## 2018-12-06 LAB — CREATININE, SERUM
Creatinine, Ser: 0.61 mg/dL (ref 0.44–1.00)
GFR calc Af Amer: >60 mL/min (ref >60)
GFR calc non Af Amer: >60 mL/min (ref >60)

## 2018-12-06 LAB — I-STAT TROPONIN, ED: Troponin i, poc: 0.01 ng/mL (ref 0.00–0.08)

## 2018-12-06 LAB — CBG MONITORING, ED: Glucose-Capillary: 144 mg/dL — ABNORMAL HIGH (ref 70–99)

## 2018-12-06 MED ORDER — METOPROLOL SUCCINATE ER 50 MG PO TB24
50.0000 mg | ORAL_TABLET | Freq: Every day | ORAL | Status: DC
  Administered 2018-12-06 – 2018-12-07 (×2): 50 mg via ORAL
  Filled 2018-12-06 (×2): qty 1

## 2018-12-06 MED ORDER — ENOXAPARIN SODIUM 40 MG/0.4ML ~~LOC~~ SOLN
40.0000 mg | SUBCUTANEOUS | Status: DC
  Administered 2018-12-06: 40 mg via SUBCUTANEOUS
  Filled 2018-12-06: qty 0.4

## 2018-12-06 MED ORDER — ACETAMINOPHEN 325 MG PO TABS: 650.0000 mg | ORAL_TABLET | Freq: Four times a day (QID) | ORAL | Status: DC | PRN

## 2018-12-06 MED ORDER — ATORVASTATIN CALCIUM 40 MG PO TABS
40.0000 mg | ORAL_TABLET | Freq: Every day | ORAL | Status: DC
  Administered 2018-12-06: 40 mg via ORAL
  Filled 2018-12-06: qty 1

## 2018-12-06 MED ORDER — HYDROXYZINE HCL 25 MG PO TABS: 25.0000 mg | ORAL_TABLET | Freq: Four times a day (QID) | ORAL | Status: DC | PRN

## 2018-12-06 MED ORDER — FLUTICASONE PROPIONATE 50 MCG/ACT NA SUSP
1.0000 | Freq: Every day | NASAL | Status: DC
  Filled 2018-12-06: qty 16

## 2018-12-06 MED ORDER — RISPERIDONE 2 MG PO TABS
2.0000 mg | ORAL_TABLET | Freq: Two times a day (BID) | ORAL | Status: DC
  Administered 2018-12-06 – 2018-12-07 (×3): 2 mg via ORAL
  Filled 2018-12-06 (×3): qty 1

## 2018-12-06 MED ORDER — MORPHINE SULFATE (PF) 2 MG/ML IV SOLN
1.0000 mg | INTRAVENOUS | Status: DC | PRN
  Administered 2018-12-06: 1 mg via INTRAVENOUS
  Filled 2018-12-06: qty 1

## 2018-12-06 MED ORDER — PAROXETINE HCL 20 MG PO TABS
20.0000 mg | ORAL_TABLET | Freq: Every day | ORAL | Status: DC | PRN
  Filled 2018-12-06: qty 1

## 2018-12-06 MED ORDER — BENZTROPINE MESYLATE 1 MG PO TABS
1.0000 mg | ORAL_TABLET | Freq: Two times a day (BID) | ORAL | Status: DC
  Administered 2018-12-06 – 2018-12-07 (×3): 1 mg via ORAL
  Filled 2018-12-06 (×3): qty 1

## 2018-12-06 MED ORDER — TRAZODONE HCL 100 MG PO TABS
100.0000 mg | ORAL_TABLET | Freq: Every evening | ORAL | Status: DC | PRN
  Filled 2018-12-06: qty 2

## 2018-12-06 MED ORDER — ADULT MULTIVITAMIN W/MINERALS CH
1.0000 | ORAL_TABLET | Freq: Every day | ORAL | Status: DC
  Administered 2018-12-07: 1 via ORAL
  Filled 2018-12-06 (×5): qty 1

## 2018-12-06 MED ORDER — ALBUTEROL SULFATE HFA 108 (90 BASE) MCG/ACT IN AERS
2.0000 | INHALATION_SPRAY | Freq: Four times a day (QID) | RESPIRATORY_TRACT | Status: DC | PRN
  Filled 2018-12-06: qty 6.7

## 2018-12-06 MED ORDER — ASPIRIN EC 81 MG PO TBEC
81.0000 mg | DELAYED_RELEASE_TABLET | Freq: Every day | ORAL | Status: DC
  Administered 2018-12-07: 81 mg via ORAL
  Filled 2018-12-06 (×2): qty 1

## 2018-12-06 MED ORDER — VALACYCLOVIR HCL 500 MG PO TABS
1000.0000 mg | ORAL_TABLET | Freq: Every day | ORAL | Status: DC
  Administered 2018-12-07: 1000 mg via ORAL
  Filled 2018-12-06 (×3): qty 2

## 2018-12-06 MED ORDER — NITROGLYCERIN 0.4 MG SL SUBL: 0.4000 mg | SUBLINGUAL_TABLET | SUBLINGUAL | Status: DC | PRN

## 2018-12-06 MED ORDER — GABAPENTIN 300 MG PO CAPS
300.0000 mg | ORAL_CAPSULE | Freq: Three times a day (TID) | ORAL | Status: DC
  Administered 2018-12-06 – 2018-12-07 (×3): 300 mg via ORAL
  Filled 2018-12-06 (×3): qty 1

## 2018-12-06 MED ORDER — CYCLOBENZAPRINE HCL 5 MG PO TABS: 5.0000 mg | ORAL_TABLET | Freq: Two times a day (BID) | ORAL | Status: DC | PRN

## 2018-12-06 MED ORDER — ACETAMINOPHEN 500 MG PO TABS: 1000.0000 mg | ORAL_TABLET | Freq: Four times a day (QID) | ORAL | Status: DC | PRN

## 2018-12-06 MED ORDER — PANTOPRAZOLE SODIUM 40 MG PO TBEC
40.0000 mg | DELAYED_RELEASE_TABLET | Freq: Every day | ORAL | Status: DC
  Administered 2018-12-06 – 2018-12-07 (×2): 40 mg via ORAL
  Filled 2018-12-06 (×2): qty 1

## 2018-12-06 MED ORDER — ALBUTEROL SULFATE (2.5 MG/3ML) 0.083% IN NEBU: 2.5000 mg | INHALATION_SOLUTION | Freq: Four times a day (QID) | RESPIRATORY_TRACT | Status: DC | PRN

## 2018-12-06 MED ORDER — GABAPENTIN 600 MG PO TABS
100.0000 mg | ORAL_TABLET | Freq: Three times a day (TID) | ORAL | Status: DC
  Filled 2018-12-06: qty 0.5

## 2018-12-06 MED ORDER — LISINOPRIL 5 MG PO TABS
5.0000 mg | ORAL_TABLET | Freq: Every day | ORAL | Status: DC
  Administered 2018-12-07: 5 mg via ORAL
  Filled 2018-12-06 (×2): qty 1

## 2018-12-06 MED ORDER — SIMETHICONE 80 MG PO CHEW
80.0000 mg | CHEWABLE_TABLET | Freq: Four times a day (QID) | ORAL | Status: DC | PRN
  Administered 2018-12-06: 80 mg via ORAL
  Filled 2018-12-06 (×2): qty 1

## 2018-12-06 MED ORDER — ACETAMINOPHEN 650 MG RE SUPP: 650.0000 mg | Freq: Four times a day (QID) | RECTAL | Status: DC | PRN

## 2018-12-06 MED ORDER — NICOTINE POLACRILEX 2 MG MT GUM
2.0000 mg | CHEWING_GUM | OROMUCOSAL | Status: DC | PRN
  Filled 2018-12-06: qty 1
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
## 2018-12-06 NOTE — ED Notes (Signed)
Pt walked to bathroom with no problems.

## 2018-12-07 ENCOUNTER — Observation Stay (HOSPITAL_COMMUNITY): Payer: Medicare Other

## 2018-12-07 ENCOUNTER — Other Ambulatory Visit: Payer: Self-pay | Admitting: Cardiology

## 2018-12-07 DIAGNOSIS — Z803 Family history of malignant neoplasm of breast: Secondary | ICD-10-CM | POA: Diagnosis not present

## 2018-12-07 DIAGNOSIS — C50919 Malignant neoplasm of unspecified site of unspecified female breast: Secondary | ICD-10-CM | POA: Diagnosis not present

## 2018-12-07 DIAGNOSIS — Z8543 Personal history of malignant neoplasm of ovary: Secondary | ICD-10-CM | POA: Diagnosis not present

## 2018-12-07 DIAGNOSIS — R079 Chest pain, unspecified: Secondary | ICD-10-CM

## 2018-12-07 DIAGNOSIS — C569 Malignant neoplasm of unspecified ovary: Secondary | ICD-10-CM | POA: Diagnosis not present

## 2018-12-07 DIAGNOSIS — R55 Syncope and collapse: Secondary | ICD-10-CM | POA: Diagnosis not present

## 2018-12-07 DIAGNOSIS — I2510 Atherosclerotic heart disease of native coronary artery without angina pectoris: Secondary | ICD-10-CM | POA: Diagnosis not present

## 2018-12-07 LAB — BASIC METABOLIC PANEL
ANION GAP: 10 (ref 5–15)
BUN: 11 mg/dL (ref 6–20)
CO2: 24 mmol/L (ref 22–32)
Calcium: 8.9 mg/dL (ref 8.9–10.3)
Chloride: 109 mmol/L (ref 98–111)
Creatinine, Ser: 0.57 mg/dL (ref 0.44–1.00)
GFR calc Af Amer: >60 mL/min (ref >60)
GFR calc non Af Amer: >60 mL/min (ref >60)
GLUCOSE: 106 mg/dL — AB (ref 70–99)
Potassium: 3.2 mmol/L — ABNORMAL LOW (ref 3.5–5.1)
Sodium: 143 mmol/L (ref 135–145)

## 2018-12-07 LAB — LIPID PANEL
Cholesterol: 201 mg/dL — ABNORMAL HIGH (ref 0–200)
HDL: 44 mg/dL (ref >40)
LDL Cholesterol: 138 mg/dL — ABNORMAL HIGH (ref 0–99)
Total CHOL/HDL Ratio: 4.6 RATIO
Triglycerides: 93 mg/dL (ref <150)
VLDL: 19 mg/dL (ref 0–40)

## 2018-12-07 LAB — CBC
HCT: 31.9 % — ABNORMAL LOW (ref 36.0–46.0)
Hemoglobin: 10.4 g/dL — ABNORMAL LOW (ref 12.0–15.0)
MCH: 27.9 pg (ref 26.0–34.0)
MCHC: 32.6 g/dL (ref 30.0–36.0)
MCV: 85.5 fL (ref 80.0–100.0)
Platelets: 239 10*3/uL (ref 150–400)
RBC: 3.73 MIL/uL — ABNORMAL LOW (ref 3.87–5.11)
RDW: 13.7 % (ref 11.5–15.5)
WBC: 4.4 10*3/uL (ref 4.0–10.5)
nRBC: 0 % (ref 0.0–0.2)

## 2018-12-07 LAB — TROPONIN I: Troponin I: <0.03 ng/mL (ref <0.03)

## 2018-12-07 MED ORDER — POTASSIUM CHLORIDE CRYS ER 20 MEQ PO TBCR
40.0000 meq | EXTENDED_RELEASE_TABLET | Freq: Once | ORAL | Status: AC
  Administered 2018-12-07: 40 meq via ORAL
  Filled 2018-12-07: qty 2
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
## 2018-12-08 ENCOUNTER — Ambulatory Visit (INDEPENDENT_AMBULATORY_CARE_PROVIDER_SITE_OTHER): Payer: Medicare Other

## 2018-12-08 DIAGNOSIS — R55 Syncope and collapse: Secondary | ICD-10-CM

## 2018-12-08 DIAGNOSIS — E119 Type 2 diabetes mellitus without complications: Secondary | ICD-10-CM | POA: Diagnosis not present

## 2018-12-09 ENCOUNTER — Telehealth (HOSPITAL_COMMUNITY): Payer: Self-pay | Admitting: *Deleted

## 2018-12-09 NOTE — Telephone Encounter (Signed)
Left message on voicemail per DPR in reference to upcoming appointment scheduled on 12/14/18 with detailed instructions given per Myocardial Perfusion Study Information Sheet for the test. LM to arrive 15 minutes early, and that it is imperative to arrive on time for appointment to keep from having the test rescheduled. If you need to cancel or reschedule your appointment, please call the office within 24 hours of your appointment. Failure to do so may result in a cancellation of your appointment, and a $50 no show fee. Phone number given for call back for any questions. Kirstie Peri

## 2018-12-14 ENCOUNTER — Encounter (HOSPITAL_COMMUNITY): Payer: Medicare Other

## 2018-12-17 ENCOUNTER — Telehealth (HOSPITAL_COMMUNITY): Payer: Self-pay | Admitting: *Deleted

## 2018-12-17 NOTE — Telephone Encounter (Signed)
Left message on voicemail per DPR in reference to upcoming appointment scheduled on 12/22/18 with detailed instructions given per Myocardial Perfusion Study Information Sheet for the test. LM to arrive 15 minutes early, and that it is imperative to arrive on time for appointment to keep from having the test rescheduled. If you need to cancel or reschedule your appointment, please call the office within 24 hours of your appointment. Failure to do so may result in a cancellation of your appointment, and a $50 no show fee. Phone number given for call back for any questions. Jaylynn Siefert Jacqueline    

## 2018-12-22 ENCOUNTER — Encounter (HOSPITAL_COMMUNITY): Payer: Medicare Other

## 2018-12-22 ENCOUNTER — Encounter: Payer: Self-pay | Admitting: Cardiology

## 2018-12-30 ENCOUNTER — Telehealth (HOSPITAL_COMMUNITY): Payer: Self-pay | Admitting: *Deleted

## 2018-12-30 NOTE — Telephone Encounter (Signed)
Patient given detailed instructions per Myocardial Perfusion Study Information Sheet for the test on 12/31/18 at 10:30. Patient notified to arrive 15 minutes early and that it is imperative to arrive on time for appointment to keep from having the test rescheduled.  If you need to cancel or reschedule your appointment, please call the office within 24 hours of your appointment. . Patient verbalized understanding.Melissa Pham

## 2018-12-31 ENCOUNTER — Ambulatory Visit (HOSPITAL_COMMUNITY): Payer: Medicare Other | Attending: Internal Medicine

## 2018-12-31 DIAGNOSIS — R079 Chest pain, unspecified: Secondary | ICD-10-CM | POA: Diagnosis not present

## 2018-12-31 LAB — MYOCARDIAL PERFUSION IMAGING
LV dias vol: 74 mL (ref 46–106)
LV sys vol: 24 mL
Peak HR: 100 {beats}/min
Rest HR: 77 {beats}/min
SDS: 0
SRS: 2
SSS: 2
TID: 0.94

## 2018-12-31 MED ORDER — TECHNETIUM TC 99M TETROFOSMIN IV KIT
10.3000 | PACK | Freq: Once | INTRAVENOUS | Status: AC | PRN
  Administered 2018-12-31: 10.3 via INTRAVENOUS
  Filled 2018-12-31: qty 11

## 2018-12-31 MED ORDER — TECHNETIUM TC 99M TETROFOSMIN IV KIT
33.0000 | PACK | Freq: Once | INTRAVENOUS | Status: AC | PRN
  Administered 2018-12-31: 33 via INTRAVENOUS
  Filled 2018-12-31: qty 33

## 2018-12-31 MED ORDER — REGADENOSON 0.4 MG/5ML IV SOLN
0.4000 mg | Freq: Once | INTRAVENOUS | Status: AC
  Administered 2018-12-31: 0.4 mg via INTRAVENOUS

## 2019-01-01 ENCOUNTER — Telehealth: Payer: Self-pay | Admitting: Cardiology

## 2019-01-01 NOTE — Telephone Encounter (Signed)
Pt called and asked to have someone go over the results of her stress test with her, done 12/31/18

## 2019-01-01 NOTE — Telephone Encounter (Signed)
The patient has been notified of the result and verbalized understanding.  All questions (if any) were answered. Julaine Hua, [place] 01/01/2019 1:27 PM

## 2019-01-05 ENCOUNTER — Telehealth: Payer: Self-pay | Admitting: *Deleted

## 2019-01-05 NOTE — Telephone Encounter (Signed)
Patient has been having problems with [employer] monitor.  She does not think it is working properly because it keeps beeping and not holding a charge.  According to [employer], it appears monitor was not working for at least 2 weeks.  Patient instructed to call [employer] and request a new monitor be shipped.  I will contact [employer] and request extension for period monitor not working.

## 2019-01-07 DIAGNOSIS — R55 Syncope and collapse: Secondary | ICD-10-CM | POA: Diagnosis not present

## 2019-01-08 ENCOUNTER — Encounter (INDEPENDENT_AMBULATORY_CARE_PROVIDER_SITE_OTHER): Payer: Self-pay

## 2019-01-08 ENCOUNTER — Encounter: Payer: Medicare Other | Admitting: Cardiology

## 2019-01-08 NOTE — Progress Notes (Signed)
This encounter was created in error - please disregard.

## 2019-01-11 DIAGNOSIS — Z87891 Personal history of nicotine dependence: Secondary | ICD-10-CM | POA: Diagnosis not present

## 2019-01-11 DIAGNOSIS — E1140 Type 2 diabetes mellitus with diabetic neuropathy, unspecified: Secondary | ICD-10-CM | POA: Diagnosis not present

## 2019-01-11 DIAGNOSIS — E119 Type 2 diabetes mellitus without complications: Secondary | ICD-10-CM | POA: Diagnosis not present

## 2019-01-11 DIAGNOSIS — K219 Gastro-esophageal reflux disease without esophagitis: Secondary | ICD-10-CM | POA: Diagnosis not present

## 2019-01-11 DIAGNOSIS — E782 Mixed hyperlipidemia: Secondary | ICD-10-CM | POA: Diagnosis not present

## 2019-01-11 DIAGNOSIS — J45909 Unspecified asthma, uncomplicated: Secondary | ICD-10-CM | POA: Diagnosis not present

## 2019-01-11 DIAGNOSIS — R1013 Epigastric pain: Secondary | ICD-10-CM | POA: Diagnosis not present

## 2019-01-11 DIAGNOSIS — M1990 Unspecified osteoarthritis, unspecified site: Secondary | ICD-10-CM | POA: Diagnosis not present

## 2019-01-26 ENCOUNTER — Telehealth: Payer: Self-pay | Admitting: Cardiology

## 2019-01-26 NOTE — Telephone Encounter (Signed)
Signed DMV Medical Review form mailed to patient. Copy scanned into chart. 01/26/19 vlm

## 2019-03-17 ENCOUNTER — Encounter: Payer: Self-pay | Admitting: Cardiology

## 2019-03-19 DIAGNOSIS — Z20828 Contact with and (suspected) exposure to other viral communicable diseases: Secondary | ICD-10-CM | POA: Diagnosis not present

## 2019-03-19 DIAGNOSIS — J22 Unspecified acute lower respiratory infection: Secondary | ICD-10-CM | POA: Diagnosis not present

## 2019-03-19 DIAGNOSIS — R05 Cough: Secondary | ICD-10-CM | POA: Diagnosis not present

## 2019-04-02 DIAGNOSIS — Z87891 Personal history of nicotine dependence: Secondary | ICD-10-CM | POA: Diagnosis not present

## 2019-04-02 DIAGNOSIS — J45909 Unspecified asthma, uncomplicated: Secondary | ICD-10-CM | POA: Diagnosis not present

## 2019-04-02 DIAGNOSIS — K219 Gastro-esophageal reflux disease without esophagitis: Secondary | ICD-10-CM | POA: Diagnosis not present

## 2019-04-02 DIAGNOSIS — R1013 Epigastric pain: Secondary | ICD-10-CM | POA: Diagnosis not present

## 2019-04-02 DIAGNOSIS — K641 Second degree hemorrhoids: Secondary | ICD-10-CM | POA: Diagnosis not present

## 2019-04-02 DIAGNOSIS — M1990 Unspecified osteoarthritis, unspecified site: Secondary | ICD-10-CM | POA: Diagnosis not present

## 2019-04-02 DIAGNOSIS — E782 Mixed hyperlipidemia: Secondary | ICD-10-CM | POA: Diagnosis not present

## 2019-04-02 DIAGNOSIS — E1140 Type 2 diabetes mellitus with diabetic neuropathy, unspecified: Secondary | ICD-10-CM | POA: Diagnosis not present

## 2019-04-02 DIAGNOSIS — K580 Irritable bowel syndrome with diarrhea: Secondary | ICD-10-CM | POA: Diagnosis not present

## 2019-04-02 DIAGNOSIS — E119 Type 2 diabetes mellitus without complications: Secondary | ICD-10-CM | POA: Diagnosis not present

## 2019-04-14 ENCOUNTER — Telehealth: Payer: Self-pay | Admitting: Cardiology

## 2019-04-14 NOTE — Telephone Encounter (Signed)
Patient dismissed from [facility]  by Fransico Him, MD effective 03/17/19. Dismissal Letter sent by 1st class mail. KLM

## 2019-07-21 ENCOUNTER — Other Ambulatory Visit: Payer: Self-pay | Admitting: Internal Medicine

## 2019-07-21 DIAGNOSIS — Z1231 Encounter for screening mammogram for malignant neoplasm of breast: Secondary | ICD-10-CM

## 2019-07-26 DIAGNOSIS — Z23 Encounter for immunization: Secondary | ICD-10-CM | POA: Diagnosis not present

## 2019-08-11 ENCOUNTER — Emergency Department (HOSPITAL_COMMUNITY)
Admission: EM | Admit: 2019-08-11 | Discharge: 2019-08-11 | Disposition: A | Discharge status: Home / Self Care | Payer: Medicare Other | Attending: Emergency Medicine | Admitting: Emergency Medicine

## 2019-08-11 ENCOUNTER — Encounter (HOSPITAL_COMMUNITY): Payer: Self-pay | Admitting: Emergency Medicine

## 2019-08-11 DIAGNOSIS — L539 Erythematous condition, unspecified: Secondary | ICD-10-CM | POA: Diagnosis present

## 2019-08-11 DIAGNOSIS — Z5189 Encounter for other specified aftercare: Secondary | ICD-10-CM

## 2019-08-11 DIAGNOSIS — Z23 Encounter for immunization: Secondary | ICD-10-CM | POA: Diagnosis not present

## 2019-08-11 DIAGNOSIS — Z9889 Other specified postprocedural states: Secondary | ICD-10-CM | POA: Diagnosis not present

## 2019-08-11 DIAGNOSIS — Z4800 Encounter for change or removal of nonsurgical wound dressing: Secondary | ICD-10-CM | POA: Diagnosis not present

## 2019-08-11 MED ORDER — TETANUS-DIPHTH-ACELL PERTUSSIS 5-2.5-18.5 LF-MCG/0.5 IM SUSP
0.5000 mL | Freq: Once | INTRAMUSCULAR | Status: AC
  Administered 2019-08-11: 0.5 mL via INTRAMUSCULAR
  Filled 2019-08-11: qty 0.5

## 2019-08-11 NOTE — Discharge Instructions (Addendum)
Apply ointment with nonstick bandage at night.  Recheck with your doctor for any concerns.

## 2019-08-11 NOTE — ED Provider Notes (Signed)
[facility] EMERGENCY DEPARTMENT Provider Note   CSN: [id number] Arrival date & time: 08/11/19  1203     History   Chief Complaint Chief Complaint  Patient presents with  . Wound Check    HPI Melissa Pham is a 53 y.o. female.     53 year old female with past medical history of diabetes presents for wound check.  Patient had a tattoo placed on her left upper arm 4 days ago, states for the past 2 days she has had some inflammation around the tattoo.  Concerned it may be connected.  No purulent drainage.  Last tetanus is unknown.  No other complaints or concerns.     Past Medical History:  Diagnosis Date  . ADHD (attention deficit hyperactivity disorder)   . Anxiety   . Asthma   . Bipolar disorder ([place])   . Blood transfusion without reported diagnosis 1985   after childbirth  . Depression   . Diabetes mellitus without complication ([place])   . GERD (gastroesophageal reflux disease)   . Hyperlipidemia   . Hypertension   . Schizophrenia ([place])   . Sickle cell trait [facility])     Patient Active Problem List   Diagnosis Date Noted  . Syncope 12/06/2018  . CAD in native artery   . Tobacco abuse 07/27/2018  . Pure hypercholesterolemia   . Generalized abdominal discomfort 12/10/2017  . Effusion, right knee 03/13/2017  . Sprain of right knee 03/13/2017  . Chest pain 02/20/2015  . Suprapubic pain 02/20/2015  . Diarrhea 02/20/2015  . Diabetes mellitus, type 2 ([place]) 02/20/2015  . HTN (hypertension) 02/20/2015  . Dyslipidemia 02/20/2015  . Bipolar affective disorder ([place]) 02/20/2015  . Schizophrenia ([place]) 02/20/2015  . Hypokalemia 02/20/2015    Past Surgical History:  Procedure Laterality Date  . CERVICAL BIOPSY  W/ LOOP ELECTRODE EXCISION    . left eye surgery  1970  . TUBAL LIGATION  1989     OB History    Gravida  7   Para      Term      Preterm      AB  3   Living  4     SAB  1   TAB  2   Ectopic      Multiple      Live  Births               Home Medications    Prior to Admission medications   Medication Sig Start Date End Date Taking? Authorizing Provider  acetaminophen (TYLENOL) 500 MG tablet Take 1,000 mg by mouth every 6 (six) hours as needed for mild pain.    [provider]  albuterol (PROVENTIL HFA;VENTOLIN HFA) 108 (90 BASE) MCG/ACT inhaler Inhale 2 puffs into the lungs every 6 (six) hours as needed for wheezing or shortness of breath.     [provider]  albuterol (PROVENTIL) (2.5 MG/3ML) 0.083% nebulizer solution Take 2.5 mg by nebulization every 6 (six) hours as needed for wheezing or shortness of breath.    [provider]  aspirin EC 81 MG tablet Take 81 mg by mouth daily.    [provider]  atorvastatin (LIPITOR) 40 MG tablet Take 1 tablet (40 mg total) by mouth daily. 12/13/17 12/06/18  Alma Friendly, MD  benztropine (COGENTIN) 1 MG tablet Take 1 mg by mouth 2 (two) times daily.    [provider]  cyclobenzaprine (FLEXERIL) 5 MG tablet Take 5 mg by mouth 2 (two) times  daily as needed for muscle spasms.     [provider]  gabapentin (NEURONTIN) 600 MG tablet Take 600 mg by mouth 3 (three) times daily.     [provider]  hydrOXYzine (ATARAX/VISTARIL) 25 MG tablet Take 25 mg by mouth every 6 (six) hours as needed for itching.     [provider]  Lidocaine-Glycerin (PREPARATION H RE) Place 1 application rectally as needed (hemmroids).    [provider]  lisinopril (PRINIVIL,ZESTRIL) 5 MG tablet Take 5 mg by mouth daily.    [provider]  metoprolol succinate (TOPROL-XL) 50 MG 24 hr tablet Take 50 mg by mouth daily. Take with or immediately following a meal.    [provider]  mometasone (NASONEX) 50 MCG/ACT nasal spray Place 2 sprays into the nose 2 (two) times daily as needed (allergies).     [provider]  Multiple Vitamins-Minerals (MULTIVITAMIN WITH MINERALS) tablet Take  1 tablet by mouth daily.    [provider]  nitroGLYCERIN (NITROSTAT) 0.4 MG SL tablet Place 0.4 mg under the tongue every 5 (five) minutes as needed for chest pain.    [provider]  omeprazole (PRILOSEC) 20 MG capsule Take 2 capsules (40 mg total) by mouth daily. 02/21/15   Hosie Poisson, MD  PARoxetine (PAXIL) 20 MG tablet Take 20 mg by mouth daily as needed (hotflashes).     [provider]  risperiDONE (RISPERDAL) 2 MG tablet Take 2 mg by mouth 2 (two) times daily.    [provider]  simethicone (MYLICON) 80 MG chewable tablet Chew 80 mg by mouth 4 (four) times daily as needed for flatulence.     [provider]  traZODone (DESYREL) 100 MG tablet Take 100-200 mg by mouth at bedtime as needed for sleep.     [provider]  valACYclovir (VALTREX) 1000 MG tablet Take 1,000 mg by mouth daily.    [provider]    Family History Family History  Problem Relation Age of Onset  . Hyperlipidemia Mother   . Arthritis Mother   . Heart attack Father   . Diabetes Father   . Hypertension Father   . Breast cancer Sister   . Cancer Sister        cervical  . Breast cancer Paternal Aunt   . Colon cancer Maternal Grandmother 67  . Esophageal cancer Neg Hx   . Rectal cancer Neg Hx     Social History Social History   Tobacco Use  . Smoking status: Current Every Day Smoker    Packs/day: 0.25    Years: 4.00    Pack years: 1.00    Types: Cigarettes  . Smokeless tobacco: Never Used  Substance Use Topics  . Alcohol use: No    Alcohol/week: 0.0 standard drinks  . Drug use: No    Comment: no drug use for 7 years     Allergies   Sulfa antibiotics   Review of Systems Review of Systems  Constitutional: Negative for fever.  Musculoskeletal: Negative for arthralgias and myalgias.  Skin: Positive for wound.  Allergic/Immunologic: Positive for immunocompromised state.  Neurological: Negative for weakness and numbness.   Hematological: Negative for adenopathy.  All other systems reviewed and are negative.    Physical Exam Updated Vital Signs BP (!) 100/59 (BP Location: Right Arm)   Pulse 70   Temp 98.5 F (36.9 C) (Oral)   Resp 16   SpO2 100%   Physical Exam Vitals signs and nursing note  reviewed.  Constitutional:      General: She is not in acute distress.    Appearance: She is well-developed. She is not diaphoretic.  HENT:     Head: Normocephalic and atraumatic.  Cardiovascular:     Pulses: Normal pulses.  Pulmonary:     Effort: Pulmonary effort is normal.  Musculoskeletal: Normal range of motion.        General: No swelling or tenderness.       Arms:  Skin:    General: Skin is warm and dry.     Findings: No erythema or rash.  Neurological:     Mental Status: She is alert and oriented to person, place, and time.  Psychiatric:        Behavior: Behavior normal.      ED Treatments / Results  Labs (all labs ordered are listed, but only abnormal results are displayed) Labs Reviewed - No data to display  EKG None  Radiology No results found.  Procedures Procedures (including critical care time)  Medications Ordered in ED Medications  Tdap (BOOSTRIX) injection 0.5 mL (has no administration in time range)     Initial Impression / Assessment and Plan / ED Course  I have reviewed the triage vital signs and the nursing notes.  Pertinent labs & imaging results that were available during my care of the patient were reviewed by me and considered in my medical decision making (see chart for details).  Clinical Course as of Aug 11 1435  Wed Aug 10, 1729  2664 53 year old female presents for wound check.  Patient had tattoo placed 4 days ago, concern for inflammation in the area.  Last tetanus is unknown, will update tetanus today.  Tattoo appears to be healing well, no surrounding erythema, no drainage.  Recommend patient apply nonstick bandage with ointment at night to prevent  sticking, follow-up with PCP as needed.   [LM]    Clinical Course User Index [LM] Tacy Learn, PA-C      Final Clinical Impressions(s) / ED Diagnoses   Final diagnoses:  Visit for wound check  Need for Tdap vaccination    ED Discharge Orders    None       Tacy Learn, PA-C 08/11/19 1436    Long, Wonda [facility], MD 08/11/19 1451

## 2019-08-11 NOTE — ED Triage Notes (Signed)
Pt got tattoo on her left arm Friday and for the last 2 days feels like it is becoming infected.

## 2019-09-01 DIAGNOSIS — Z87891 Personal history of nicotine dependence: Secondary | ICD-10-CM | POA: Diagnosis not present

## 2019-09-01 DIAGNOSIS — M1990 Unspecified osteoarthritis, unspecified site: Secondary | ICD-10-CM | POA: Diagnosis not present

## 2019-09-01 DIAGNOSIS — E1140 Type 2 diabetes mellitus with diabetic neuropathy, unspecified: Secondary | ICD-10-CM | POA: Diagnosis not present

## 2019-09-01 DIAGNOSIS — E782 Mixed hyperlipidemia: Secondary | ICD-10-CM | POA: Diagnosis not present

## 2019-09-01 DIAGNOSIS — Z0001 Encounter for general adult medical examination with abnormal findings: Secondary | ICD-10-CM | POA: Diagnosis not present

## 2019-09-01 DIAGNOSIS — K641 Second degree hemorrhoids: Secondary | ICD-10-CM | POA: Diagnosis not present

## 2019-09-01 DIAGNOSIS — E119 Type 2 diabetes mellitus without complications: Secondary | ICD-10-CM | POA: Diagnosis not present

## 2019-09-01 DIAGNOSIS — K580 Irritable bowel syndrome with diarrhea: Secondary | ICD-10-CM | POA: Diagnosis not present

## 2019-09-01 DIAGNOSIS — J45909 Unspecified asthma, uncomplicated: Secondary | ICD-10-CM | POA: Diagnosis not present

## 2019-09-01 DIAGNOSIS — K219 Gastro-esophageal reflux disease without esophagitis: Secondary | ICD-10-CM | POA: Diagnosis not present

## 2019-09-01 DIAGNOSIS — R1013 Epigastric pain: Secondary | ICD-10-CM | POA: Diagnosis not present

## 2019-09-03 ENCOUNTER — Ambulatory Visit: Payer: Medicare Other

## 2019-09-06 ENCOUNTER — Ambulatory Visit
Admission: RE | Admit: 2019-09-06 | Discharge: 2019-09-06 | Disposition: A | Discharge status: Home / Self Care | Payer: Medicare Other | Source: Ambulatory Visit | Attending: Internal Medicine | Admitting: Internal Medicine

## 2019-09-06 ENCOUNTER — Other Ambulatory Visit: Payer: Self-pay

## 2019-09-06 DIAGNOSIS — Z1231 Encounter for screening mammogram for malignant neoplasm of breast: Secondary | ICD-10-CM

## 2019-11-04 ENCOUNTER — Other Ambulatory Visit: Payer: Self-pay

## 2019-11-04 DIAGNOSIS — Z20822 Contact with and (suspected) exposure to covid-19: Secondary | ICD-10-CM

## 2019-11-05 LAB — NOVEL CORONAVIRUS, NAA: SARS-CoV-2, NAA: NOT DETECTED

## 2019-11-08 ENCOUNTER — Encounter (HOSPITAL_COMMUNITY): Payer: Self-pay

## 2019-11-08 ENCOUNTER — Ambulatory Visit (HOSPITAL_COMMUNITY)
Admission: EM | Admit: 2019-11-08 | Discharge: 2019-11-08 | Disposition: A | Discharge status: Home / Self Care | Payer: Medicare Other | Attending: Family Medicine | Admitting: Family Medicine

## 2019-11-08 ENCOUNTER — Other Ambulatory Visit: Payer: Self-pay

## 2019-11-08 DIAGNOSIS — R300 Dysuria: Secondary | ICD-10-CM | POA: Insufficient documentation

## 2019-11-08 LAB — POCT URINALYSIS DIP (DEVICE)
Bilirubin Urine: NEGATIVE
Glucose, UA: NEGATIVE mg/dL
Ketones, ur: NEGATIVE mg/dL
Leukocytes,Ua: NEGATIVE
Nitrite: NEGATIVE
Protein, ur: NEGATIVE mg/dL
Specific Gravity, Urine: 1.025 (ref 1.005–1.030)
Urobilinogen, UA: 0.2 mg/dL (ref 0.0–1.0)
pH: 5.5 (ref 5.0–8.0)

## 2019-11-08 NOTE — ED Triage Notes (Signed)
Patient presents to Urgent Care with complaints of lower abdominal cramping and urinary urgency and burning since last week about 5 days ago. Patient reports she thinks she may have a UTI.

## 2019-11-08 NOTE — ED Provider Notes (Signed)
[place]    CSN: [id number] Arrival date & time: 11/08/19  0955      History   Chief Complaint Chief Complaint  Patient presents with  . Urinary Tract Infection    HPI Melissa Pham is a 54 y.o. female.   Patient is a 54 year old female past medical history of ADHD, anxiety, asthma, bipolar, depression, diabetes, GERD, hyperlipidemia, hypertension, schizophrenia, sickle cell trait.  She presents today with approximately 5 days of lower abdominal cramping, urinary urgency and dysuria.  Symptoms have been constant. Mild vaginal discharge without itching. No LMP recorded. (Menstrual status: Perimenopausal). No fever, flank pain, N,V.  ROS per HPI       Past Medical History:  Diagnosis Date  . ADHD (attention deficit hyperactivity disorder)   . Anxiety   . Asthma   . Bipolar disorder ([place])   . Blood transfusion without reported diagnosis 1985   after childbirth  . Depression   . Diabetes mellitus without complication ([place])   . GERD (gastroesophageal reflux disease)   . Hyperlipidemia   . Hypertension   . Schizophrenia ([place])   . Sickle cell trait [facility])     Patient Active Problem List   Diagnosis Date Noted  . Syncope 12/06/2018  . CAD in native artery   . Tobacco abuse 07/27/2018  . Pure hypercholesterolemia   . Generalized abdominal discomfort 12/10/2017  . Effusion, right knee 03/13/2017  . Sprain of right knee 03/13/2017  . Chest pain 02/20/2015  . Suprapubic pain 02/20/2015  . Diarrhea 02/20/2015  . Diabetes mellitus, type 2 ([place]) 02/20/2015  . HTN (hypertension) 02/20/2015  . Dyslipidemia 02/20/2015  . Bipolar affective disorder ([place]) 02/20/2015  . Schizophrenia ([place]) 02/20/2015  . Hypokalemia 02/20/2015    Past Surgical History:  Procedure Laterality Date  . CERVICAL BIOPSY  W/ LOOP ELECTRODE EXCISION    . left eye surgery  1970  . TUBAL LIGATION  1989    OB History    Gravida  7   Para      Term      Preterm      AB  3    Living  4     SAB  1   TAB  2   Ectopic      Multiple      Live Births               Home Medications    Prior to Admission medications   Medication Sig Start Date End Date Taking? Authorizing Provider  acetaminophen (TYLENOL) 500 MG tablet Take 1,000 mg by mouth every 6 (six) hours as needed for mild pain.    [provider]  albuterol (PROVENTIL HFA;VENTOLIN HFA) 108 (90 BASE) MCG/ACT inhaler Inhale 2 puffs into the lungs every 6 (six) hours as needed for wheezing or shortness of breath.     [provider]  albuterol (PROVENTIL) (2.5 MG/3ML) 0.083% nebulizer solution Take 2.5 mg by nebulization every 6 (six) hours as needed for wheezing or shortness of breath.    [provider]  aspirin EC 81 MG tablet Take 81 mg by mouth daily.    [provider]  atorvastatin (LIPITOR) 40 MG tablet Take 1 tablet (40 mg total) by mouth daily. 12/13/17 12/06/18  Alma Friendly, MD  benztropine (COGENTIN) 1 MG tablet Take 1 mg by mouth 2 (two) times daily.    [provider]  cyclobenzaprine (FLEXERIL) 5 MG tablet Take 5 mg by mouth 2 (two) times daily  as needed for muscle spasms.     [provider]  gabapentin (NEURONTIN) 600 MG tablet Take 600 mg by mouth 3 (three) times daily.     [provider]  hydrOXYzine (ATARAX/VISTARIL) 25 MG tablet Take 25 mg by mouth every 6 (six) hours as needed for itching.     [provider]  Lidocaine-Glycerin (PREPARATION H RE) Place 1 application rectally as needed (hemmroids).    [provider]  lisinopril (PRINIVIL,ZESTRIL) 5 MG tablet Take 5 mg by mouth daily.    [provider]  metoprolol succinate (TOPROL-XL) 50 MG 24 hr tablet Take 50 mg by mouth daily. Take with or immediately following a meal.    [provider]  mometasone (NASONEX) 50 MCG/ACT nasal spray Place 2 sprays into the nose 2 (two) times daily as needed (allergies).     [provider]  Multiple Vitamins-Minerals (MULTIVITAMIN WITH MINERALS) tablet Take 1 tablet by mouth daily.    [provider]  nitroGLYCERIN (NITROSTAT) 0.4 MG SL tablet Place 0.4 mg under the tongue every 5 (five) minutes as needed for chest pain.    [provider]  omeprazole (PRILOSEC) 20 MG capsule Take 2 capsules (40 mg total) by mouth daily. 02/21/15   Hosie Poisson, MD  PARoxetine (PAXIL) 20 MG tablet Take 20 mg by mouth daily as needed (hotflashes).     [provider]  risperiDONE (RISPERDAL) 2 MG tablet Take 2 mg by mouth 2 (two) times daily.    [provider]  simethicone (MYLICON) 80 MG chewable tablet Chew 80 mg by mouth 4 (four) times daily as needed for flatulence.     [provider]  traZODone (DESYREL) 100 MG tablet Take 100-200 mg by mouth at bedtime as needed for sleep.     [provider]  valACYclovir (VALTREX) 1000 MG tablet Take 1,000 mg by mouth daily.    [provider]    Family History Family History  Problem Relation Age of Onset  . Hyperlipidemia Mother   . Arthritis Mother   . Heart attack Father   . Diabetes Father   . Hypertension Father   . Breast cancer Sister   . Cancer Sister        cervical  . Breast cancer Paternal Aunt   . Colon cancer Maternal Grandmother 58  . Esophageal cancer Neg Hx   . Rectal cancer Neg Hx     Social History Social History   Tobacco Use  . Smoking status: Current Every Day Smoker    Packs/day: 0.25    Years: 4.00    Pack years: 1.00    Types: Cigarettes  . Smokeless tobacco: Never Used  Substance Use Topics  . Alcohol use: No    Alcohol/week: 0.0 standard drinks  . Drug use: No    Comment: no drug use for 7 years     Allergies   Sulfa antibiotics   Review of Systems Review of Systems   Physical Exam Triage Vital Signs ED Triage Vitals  Enc Vitals Group     BP 11/08/19 1054 (!) 147/114     Pulse Rate 11/08/19 1054 100     Resp  11/08/19 1054 18     Temp 11/08/19 1054 98.2 F (36.8 C)     Temp Source 11/08/19 1054 Oral     SpO2 11/08/19 1054 99 %     Weight --      Height --      Head  Circumference --      Peak Flow --      Pain Score 11/08/19 1102 7     Pain Loc --      Pain Edu? --      Excl. in Meta? --    No data found.  Updated Vital Signs BP (!) 147/114 (BP Location: Left Arm)   Pulse 100   Temp 98.2 F (36.8 C) (Oral)   Resp 18   SpO2 99%   Visual Acuity Right Eye Distance:   Left Eye Distance:   Bilateral Distance:    Right Eye Near:   Left Eye Near:    Bilateral Near:     Physical Exam Vitals and nursing note reviewed.  Constitutional:      General: She is not in acute distress.    Appearance: Normal appearance. She is not ill-appearing, toxic-appearing or diaphoretic.  HENT:     Head: Normocephalic.     Nose: Nose normal.     Mouth/Throat:     Pharynx: Oropharynx is clear.  Eyes:     Conjunctiva/sclera: Conjunctivae normal.  Pulmonary:     Effort: Pulmonary effort is normal.  Abdominal:     Palpations: Abdomen is soft.     Tenderness: There is no abdominal tenderness.  Musculoskeletal:        General: Normal range of motion.     Cervical back: Normal range of motion.  Skin:    General: Skin is warm and dry.     Findings: No rash.  Neurological:     Mental Status: She is alert.  Psychiatric:        Mood and Affect: Mood normal.      UC Treatments / Results  Labs (all labs ordered are listed, but only abnormal results are displayed) Labs Reviewed  POCT URINALYSIS DIP (DEVICE) - Abnormal; Notable for the following components:      Result Value   Hgb urine dipstick SMALL (*)    All other components within normal limits  URINE CULTURE  CERVICOVAGINAL ANCILLARY ONLY    EKG   Radiology No results found.  Procedures Procedures (including critical care time)  Medications Ordered in UC Medications - No data to display  Initial Impression / Assessment and  Plan / UC Course  I have reviewed the triage vital signs and the nursing notes.  Pertinent labs & imaging results that were available during my care of the patient were reviewed by me and considered in my medical decision making (see chart for details).     Dysuria- urine with small hgb and no signs of infection Sending for culture Also sending swab for testing.  Most likely symptoms are vaginal related.   Final Clinical Impressions(s) / UC Diagnoses   Final diagnoses:  Dysuria     Discharge Instructions     We are sending a swab for testing and will call you with any positive results.  Sending urine for culture.     ED Prescriptions    None     PDMP not reviewed this encounter.   Loura Halt A, NP 11/10/19 [phone]

## 2019-11-08 NOTE — Discharge Instructions (Signed)
We are sending a swab for testing and will call you with any positive results.  Sending urine for culture.

## 2019-11-09 LAB — URINE CULTURE: Culture: NO GROWTH

## 2019-11-10 LAB — CERVICOVAGINAL ANCILLARY ONLY
Bacterial vaginitis: NEGATIVE
Candida vaginitis: NEGATIVE
Chlamydia: NEGATIVE
Neisseria Gonorrhea: NEGATIVE
Trichomonas: NEGATIVE

## 2020-02-04 ENCOUNTER — Ambulatory Visit: Payer: Medicare Other

## 2020-02-23 ENCOUNTER — Emergency Department (HOSPITAL_COMMUNITY): Payer: Medicare Other

## 2020-02-23 ENCOUNTER — Encounter (HOSPITAL_COMMUNITY): Payer: Self-pay

## 2020-02-23 ENCOUNTER — Emergency Department (HOSPITAL_COMMUNITY)
Admission: EM | Admit: 2020-02-23 | Discharge: 2020-02-23 | Disposition: A | Discharge status: Home / Self Care | Payer: Medicare Other | Attending: Internal Medicine | Admitting: Internal Medicine

## 2020-02-23 ENCOUNTER — Other Ambulatory Visit: Payer: Self-pay

## 2020-02-23 ENCOUNTER — Ambulatory Visit (INDEPENDENT_AMBULATORY_CARE_PROVIDER_SITE_OTHER)
Admission: EM | Admit: 2020-02-23 | Discharge: 2020-02-23 | Disposition: A | Discharge status: Home / Self Care | Payer: Medicare Other | Source: Home / Self Care | Attending: Internal Medicine | Admitting: Internal Medicine

## 2020-02-23 DIAGNOSIS — Z7901 Long term (current) use of anticoagulants: Secondary | ICD-10-CM | POA: Diagnosis not present

## 2020-02-23 DIAGNOSIS — R0789 Other chest pain: Secondary | ICD-10-CM | POA: Insufficient documentation

## 2020-02-23 DIAGNOSIS — E119 Type 2 diabetes mellitus without complications: Secondary | ICD-10-CM | POA: Diagnosis not present

## 2020-02-23 DIAGNOSIS — I10 Essential (primary) hypertension: Secondary | ICD-10-CM | POA: Insufficient documentation

## 2020-02-23 DIAGNOSIS — I208 Other forms of angina pectoris: Secondary | ICD-10-CM | POA: Diagnosis not present

## 2020-02-23 DIAGNOSIS — Z79899 Other long term (current) drug therapy: Secondary | ICD-10-CM | POA: Insufficient documentation

## 2020-02-23 DIAGNOSIS — F17210 Nicotine dependence, cigarettes, uncomplicated: Secondary | ICD-10-CM | POA: Diagnosis not present

## 2020-02-23 DIAGNOSIS — Z7982 Long term (current) use of aspirin: Secondary | ICD-10-CM | POA: Insufficient documentation

## 2020-02-23 DIAGNOSIS — F250 Schizoaffective disorder, bipolar type: Secondary | ICD-10-CM | POA: Diagnosis not present

## 2020-02-23 DIAGNOSIS — R42 Dizziness and giddiness: Secondary | ICD-10-CM | POA: Insufficient documentation

## 2020-02-23 DIAGNOSIS — R079 Chest pain, unspecified: Secondary | ICD-10-CM | POA: Diagnosis not present

## 2020-02-23 DIAGNOSIS — I2510 Atherosclerotic heart disease of native coronary artery without angina pectoris: Secondary | ICD-10-CM | POA: Diagnosis not present

## 2020-02-23 LAB — BASIC METABOLIC PANEL
Anion gap: 9 (ref 5–15)
BUN: 10 mg/dL (ref 6–20)
CO2: 25 mmol/L (ref 22–32)
Calcium: 9.2 mg/dL (ref 8.9–10.3)
Chloride: 109 mmol/L (ref 98–111)
Creatinine, Ser: 0.86 mg/dL (ref 0.44–1.00)
GFR calc Af Amer: >60 mL/min (ref >60)
GFR calc non Af Amer: >60 mL/min (ref >60)
Glucose, Bld: 106 mg/dL — ABNORMAL HIGH (ref 70–99)
Potassium: 3.6 mmol/L (ref 3.5–5.1)
Sodium: 143 mmol/L (ref 135–145)

## 2020-02-23 LAB — CBC
HCT: 36.9 % (ref 36.0–46.0)
Hemoglobin: 12 g/dL (ref 12.0–15.0)
MCH: 28.8 pg (ref 26.0–34.0)
MCHC: 32.5 g/dL (ref 30.0–36.0)
MCV: 88.7 fL (ref 80.0–100.0)
Platelets: 343 10*3/uL (ref 150–400)
RBC: 4.16 MIL/uL (ref 3.87–5.11)
RDW: 14.5 % (ref 11.5–15.5)
WBC: 6.5 10*3/uL (ref 4.0–10.5)
nRBC: 0 % (ref 0.0–0.2)

## 2020-02-23 LAB — TROPONIN I (HIGH SENSITIVITY)
Troponin I (High Sensitivity): 3 ng/L (ref <18)
Troponin I (High Sensitivity): 3 ng/L (ref <18)

## 2020-02-23 MED ORDER — DICLOFENAC SODIUM 1 % EX GEL: 4.0000 g | Freq: Four times a day (QID) | CUTANEOUS | 2 refills | Status: DC

## 2020-02-23 MED ORDER — HYDROCORTISONE ACETATE 25 MG RE SUPP: 25.0000 mg | Freq: Two times a day (BID) | RECTAL | 0 refills | Status: DC

## 2020-02-23 NOTE — ED Triage Notes (Signed)
Pt has multiple complaints:  Left sided chest pain that radiates down her arm and to her lower back since the weekend. States she has been working out more and just moved into a new home.   Pt also reports near syncopal episode while in the store today, took 2 chewable ASA.   Pt also reports diarrhea and hemorrhoids.  Pt a.o, resp e.u

## 2020-02-23 NOTE — ED Provider Notes (Signed)
[facility] EMERGENCY DEPARTMENT Provider Note   CSN: [id number] Arrival date & time: 02/23/20  1255     History Chief Complaint  Patient presents with  . Chest Pain    Melissa Pham is a 53 y.o. female.  HPI      Melissa Pham is a 54 y.o. female, with a history of anxiety, asthma, bipolar, schizophrenia, HTN, hyperlipidemia, DM, GERD, presenting to the ED for chest pain for the last several days.  She has recently started working out again and was doing some heavy lifting several days ago.  Since then, she has had a soreness in her left chest, increased pain with movement of the left shoulder, moderate in intensity.  She has had intermittent episodes of sharp pain and spasms through the chest.  She had one such episode today while shopping.  She took some aspirin and then went home to go to sleep.  She suspects her pain is due to a pulled muscle.  Also endorses hemorrhoids for which she has tried Tucks and Epson salt baths.  Denies fever/chills, cough, shortness of breath, syncope, abdominal pain, N/V/D, or any other complaints.    Past Medical History:  Diagnosis Date  . ADHD (attention deficit hyperactivity disorder)   . Anxiety   . Asthma   . Bipolar disorder ([place])   . Blood transfusion without reported diagnosis 1985   after childbirth  . Depression   . Diabetes mellitus without complication ([place])   . GERD (gastroesophageal reflux disease)   . Hyperlipidemia   . Hypertension   . Schizophrenia ([place])   . Sickle cell trait [facility])     Patient Active Problem List   Diagnosis Date Noted  . Syncope 12/06/2018  . CAD in native artery   . Tobacco abuse 07/27/2018  . Pure hypercholesterolemia   . Generalized abdominal discomfort 12/10/2017  . Effusion, right knee 03/13/2017  . Sprain of right knee 03/13/2017  . Chest pain 02/20/2015  . Suprapubic pain 02/20/2015  . Diarrhea 02/20/2015  . Diabetes mellitus, type 2 ([place]) 02/20/2015  . HTN  (hypertension) 02/20/2015  . Dyslipidemia 02/20/2015  . Bipolar affective disorder ([place]) 02/20/2015  . Schizophrenia ([place]) 02/20/2015  . Hypokalemia 02/20/2015    Past Surgical History:  Procedure Laterality Date  . CERVICAL BIOPSY  W/ LOOP ELECTRODE EXCISION    . left eye surgery  1970  . TUBAL LIGATION  1989     OB History    Gravida  7   Para      Term      Preterm      AB  3   Living  4     SAB  1   TAB  2   Ectopic      Multiple      Live Births              Family History  Problem Relation Age of Onset  . Hyperlipidemia Mother   . Arthritis Mother   . Heart attack Father   . Diabetes Father   . Hypertension Father   . Breast cancer Sister   . Cancer Sister        cervical  . Breast cancer Paternal Aunt   . Colon cancer Maternal Grandmother 83  . Esophageal cancer Neg Hx   . Rectal cancer Neg Hx     Social History   Tobacco Use  . Smoking status: Current Every Day Smoker    Packs/day: 0.25  Years: 4.00    Pack years: 1.00    Types: Cigarettes  . Smokeless tobacco: Never Used  Substance Use Topics  . Alcohol use: No    Alcohol/week: 0.0 standard drinks  . Drug use: No    Comment: no drug use for 7 years    Home Medications Prior to Admission medications   Medication Sig Start Date End Date Taking? Authorizing Provider  acetaminophen (TYLENOL) 500 MG tablet Take 1,000 mg by mouth every 6 (six) hours as needed for mild pain.   Yes [provider]  albuterol (PROVENTIL HFA;VENTOLIN HFA) 108 (90 BASE) MCG/ACT inhaler Inhale 2 puffs into the lungs every 6 (six) hours as needed for wheezing or shortness of breath.    Yes [provider]  albuterol (PROVENTIL) (2.5 MG/3ML) 0.083% nebulizer solution Take 2.5 mg by nebulization every 6 (six) hours as needed for wheezing or shortness of breath.   Yes [provider]  aspirin EC 81 MG tablet Take 81 mg by mouth daily.   Yes [provider]  gabapentin  (NEURONTIN) 600 MG tablet Take 600 mg by mouth 3 (three) times daily.    Yes [provider]  Lidocaine-Glycerin (PREPARATION H RE) Place 1 application rectally as needed (hemmroids).   Yes [provider]  metoprolol succinate (TOPROL-XL) 50 MG 24 hr tablet Take 50 mg by mouth daily. Take with or immediately following a meal.   Yes [provider]  mometasone (NASONEX) 50 MCG/ACT nasal spray Place 2 sprays into the nose 2 (two) times daily as needed (allergies).    Yes [provider]  Multiple Vitamins-Minerals (MULTIVITAMIN WITH MINERALS) tablet Take 1 tablet by mouth daily.   Yes [provider]  nitroGLYCERIN (NITROSTAT) 0.4 MG SL tablet Place 0.4 mg under the tongue every 5 (five) minutes as needed for chest pain.   Yes [provider]  omeprazole (PRILOSEC) 20 MG capsule Take 2 capsules (40 mg total) by mouth daily. 02/21/15  Yes Hosie Poisson, MD  PARoxetine (PAXIL) 20 MG tablet Take 20 mg by mouth daily as needed (hotflashes).    Yes [provider]  valACYclovir (VALTREX) 1000 MG tablet Take 1,000 mg by mouth daily.   Yes [provider]  atorvastatin (LIPITOR) 40 MG tablet Take 1 tablet (40 mg total) by mouth daily. Patient not taking: Reported on 02/23/2020 12/13/17 12/06/18  Alma Friendly, MD  diclofenac Sodium (VOLTAREN) 1 % GEL Apply 4 g topically 4 (four) times daily. 02/23/20   Edrie Ehrich, Helane Gunther, PA-C  hydrocortisone (ANUSOL-HC) 25 MG suppository Place 1 suppository (25 mg total) rectally 2 (two) times daily. 02/23/20   Jettie Lazare C, PA-C    Allergies    Sulfa antibiotics  Review of Systems   Review of Systems  Constitutional: Negative for chills, diaphoresis and fever.  Respiratory: Negative for cough and shortness of breath.   Cardiovascular: Positive for chest pain. Negative for leg swelling.  Gastrointestinal: Negative for abdominal pain, blood in stool, diarrhea, nausea and vomiting.  Musculoskeletal:  Negative for back pain.  Neurological: Negative for dizziness, syncope, weakness and numbness.  All other systems reviewed and are negative.   Physical Exam Updated Vital Signs BP (!) 122/56 (BP Location: Right Arm)   Pulse 78   Temp 98.1 F (36.7 C) (Oral)   Resp 14   Ht 5\' 2"  (1.575 m)   Wt 86.2 kg   SpO2 95%   BMI 34.75 kg/m   Physical Exam Vitals and nursing note  reviewed.  Constitutional:      General: She is not in acute distress.    Appearance: She is well-developed. She is not diaphoretic.  HENT:     Head: Normocephalic and atraumatic.     Mouth/Throat:     Mouth: Mucous membranes are moist.     Pharynx: Oropharynx is clear.  Eyes:     Conjunctiva/sclera: Conjunctivae normal.  Cardiovascular:     Rate and Rhythm: Normal rate and regular rhythm.     Pulses: Normal pulses.          Radial pulses are 2+ on the right side and 2+ on the left side.       Posterior tibial pulses are 2+ on the right side and 2+ on the left side.     Heart sounds: Normal heart sounds.     Comments: Tactile temperature in the extremities appropriate and equal bilaterally. Pulmonary:     Effort: Pulmonary effort is normal. No respiratory distress.     Breath sounds: Normal breath sounds.  Chest:    Abdominal:     Palpations: Abdomen is soft.     Tenderness: There is no abdominal tenderness. There is no guarding.  Genitourinary:    Comments: Patient states she will have her PCP examine and reevaluate her hemorrhoids. Musculoskeletal:     Cervical back: Neck supple.     Right lower leg: No edema.     Left lower leg: No edema.     Comments: Tenderness to left upper chest.  Increased pain with range of motion of the left shoulder.  No erythema, increased warmth, swelling, or deformity noted.  Lymphadenopathy:     Cervical: No cervical adenopathy.  Skin:    General: Skin is warm and dry.  Neurological:     Mental Status: She is alert.     Comments: Sensation grossly intact to light  touch through each of the nerve distributions of the bilateral upper extremities. Abduction and adduction of the fingers intact against resistance. Grip strength equal bilaterally. Supination and pronation intact against resistance. Strength 5/5 through the cardinal directions of the bilateral wrists. Strength 5/5 with flexion and extension of the bilateral elbows. Patient can touch the thumb to each one of the fingertips without difficulty.  Patient can hold the "OK" sign against resistance.  Psychiatric:        Mood and Affect: Mood and affect normal.        Speech: Speech normal.        Behavior: Behavior normal.     ED Results / Procedures / Treatments   Labs (all labs ordered are listed, but only abnormal results are displayed) Labs Reviewed  BASIC METABOLIC PANEL - Abnormal; Notable for the following components:      Result Value   Glucose, Bld 106 (*)    All other components within normal limits  CBC  TROPONIN I (HIGH SENSITIVITY)  TROPONIN I (HIGH SENSITIVITY)    EKG EKG Interpretation  Date/Time:  Wednesday February 23 2020 13:02:00 EDT Ventricular Rate:  79 PR Interval:  154 QRS Duration: 64 QT Interval:  374 QTC Calculation: 428 R Axis:   67 Text Interpretation: Normal sinus rhythm Normal ECG No significant change since last tracing Confirmed by Blanchie Dessert [phone]) on 02/23/2020 6:30:26 PM   Radiology DG Chest 2 View  Result Date: 02/23/2020 CLINICAL DATA:  54 year old female with chest and back pain for 1 week. Irregular heartbeat. EXAM: CHEST - 2 VIEW COMPARISON:  Chest radiographs 12/06/2018 and  earlier. FINDINGS: Lung volumes and mediastinal contours remain normal. Visualized tracheal air column is within normal limits. Both lungs appear stable and clear. No pneumothorax or pleural effusion. No osseous abnormality identified. Negative visible bowel gas pattern. IMPRESSION: Negative.  No cardiopulmonary abnormality. Electronically Signed   By: Genevie Ann M.D.    On: 02/23/2020 13:53    Procedures Procedures (including critical care time)  Medications Ordered in ED Medications - No data to display  ED Course  I have reviewed the triage vital signs and the nursing notes.  Pertinent labs & imaging results that were available during my care of the patient were reviewed by me and considered in my medical decision making (see chart for details).    MDM Rules/Calculators/A&P                      Patient presents with chest soreness with intermittent spasms. Patient is nontoxic appearing, afebrile, not tachycardic, not tachypneic, not hypotensive, maintains excellent SPO2 on room air, and is in no apparent distress.  Some vitals were recorded indicating tachypnea, however, patient had no tachypnea through multiple reassessments.  I have reviewed the patient's chart to obtain more information.   I reviewed and interpreted the patient's labs and radiological studies. Delta troponins negative.  She suspects her pain is due to a pulled muscle.  Her pain is reproducible on exam. No acute EKGs abnormalities.  The patient was given instructions for home care as well as return precautions. Patient voices understanding of these instructions, accepts the plan, and is comfortable with discharge.   Vitals:   02/23/20 1529 02/23/20 1700 02/23/20 1730 02/23/20 1800  BP: (!) 122/56 (!) 118/57 125/77 129/66  Pulse: 78 79 65 73  Resp: 14 20 (!) 28 (!) 28  Temp: 98.1 F (36.7 C)     TempSrc: Oral     SpO2: 95% 99% 98% 98%  Weight:      Height:         Final Clinical Impression(s) / ED Diagnoses Final diagnoses:  Atypical chest pain    Rx / DC Orders ED Discharge Orders         Ordered    diclofenac Sodium (VOLTAREN) 1 % GEL  4 times daily     02/23/20 1830    hydrocortisone (ANUSOL-HC) 25 MG suppository  2 times daily     02/23/20 1839           Layla Maw 02/24/20 0104    Blanchie Dessert, MD 02/25/20 1513

## 2020-02-23 NOTE — Discharge Instructions (Addendum)
  Diclofenac gel: This is a topical anti-inflammatory medication and can be applied directly to the painful region.  Do not use on the face or genitals.  This medication may be used as an alternative to oral anti-inflammatory medications, such as ibuprofen or naproxen.  Hemorrhoids  The mainstay of treatment and prevention of hemorrhoids is taking steps to assure regular, soft bowel movements.  Hydration: It is recommended that you drink at least eight 8 ounce glasses of water a day to stay well-hydrated.  Fiber: May use fiber supplements, such as methylcellulose (eg, Citrucel) or psyllium (eg, Metamucil).  You may also increase the amount of fiber in your diet.  Symptomatic treatments  Hydrocortisone: Hydrocortisone creams or suppositories may be used to reduce inflammation and provide pain relief.  Use these treatments for no more than 7 days at a time.  Which Hazel: Apply as needed up to 6 times per day or after each bowel movement.  This can be found as a liquid or in products such as Tucks or Preparation H pads.  Stool softeners: May use stool softeners, such as docusate (generic for Colace), to improve comfort with bowel movements.  Follow-up: Follow-up with your primary care provider on this matter.  For any continued assessment of chest discomfort, may follow-up with the cardiology clinic.  Return: Return to emergency department for worsening chest discomfort, shortness of breath, abdominal pain, passing out, dizziness, or any other major concerns.

## 2020-02-23 NOTE — ED Triage Notes (Signed)
Pt c/o sharp pain to L side of chest x 8 days. Now reports numbness down L arm since Saturday. States worse after working in yard this weekend.  Diarrhea x 7 days, recently stopped metformin due to this.

## 2020-02-24 NOTE — ED Provider Notes (Signed)
[name]    CSN: [id number] Arrival date & time: 02/23/20  1135      History   Chief Complaint Chief Complaint  Patient presents with  . Chest Pain    HPI Melissa Pham is a 54 y.o. female with a history of diabetes mellitus type 2, hypertension and hyperlipidemia comes to urgent care with complaints of severe chest tightness radiating to the left shoulder over the past 8 days.  Symptoms have been intermittent and has increased in intensity over the past several days.  Last night patient developed chest pain which was severe, 8/10  associated with some dizziness and cold sweats.  Patient took a dose of aspirin.  She decided to come to the urgent care today because the pain did not resolve.  No nausea or vomiting.  No fever or chills.  No trauma to the chest. HPI  Past Medical History:  Diagnosis Date  . ADHD (attention deficit hyperactivity disorder)   . Anxiety   . Asthma   . Bipolar disorder ([place])   . Blood transfusion without reported diagnosis 1985   after childbirth  . Depression   . Diabetes mellitus without complication ([place])   . GERD (gastroesophageal reflux disease)   . Hyperlipidemia   . Hypertension   . Schizophrenia ([place])   . Sickle cell trait [facility])     Patient Active Problem List   Diagnosis Date Noted  . Syncope 12/06/2018  . CAD in native artery   . Tobacco abuse 07/27/2018  . Pure hypercholesterolemia   . Generalized abdominal discomfort 12/10/2017  . Effusion, right knee 03/13/2017  . Sprain of right knee 03/13/2017  . Chest pain 02/20/2015  . Suprapubic pain 02/20/2015  . Diarrhea 02/20/2015  . Diabetes mellitus, type 2 ([place]) 02/20/2015  . HTN (hypertension) 02/20/2015  . Dyslipidemia 02/20/2015  . Bipolar affective disorder ([place]) 02/20/2015  . Schizophrenia ([place]) 02/20/2015  . Hypokalemia 02/20/2015    Past Surgical History:  Procedure Laterality Date  . CERVICAL BIOPSY  W/ LOOP ELECTRODE EXCISION    . left eye surgery   1970  . TUBAL LIGATION  1989    OB History    Gravida  7   Para      Term      Preterm      AB  3   Living  4     SAB  1   TAB  2   Ectopic      Multiple      Live Births               Home Medications    Prior to Admission medications   Medication Sig Start Date End Date Taking? Authorizing Provider  acetaminophen (TYLENOL) 500 MG tablet Take 1,000 mg by mouth every 6 (six) hours as needed for mild pain.    [provider]  albuterol (PROVENTIL HFA;VENTOLIN HFA) 108 (90 BASE) MCG/ACT inhaler Inhale 2 puffs into the lungs every 6 (six) hours as needed for wheezing or shortness of breath.     [provider]  albuterol (PROVENTIL) (2.5 MG/3ML) 0.083% nebulizer solution Take 2.5 mg by nebulization every 6 (six) hours as needed for wheezing or shortness of breath.    [provider]  aspirin EC 81 MG tablet Take 81 mg by mouth daily.    [provider]  atorvastatin (LIPITOR) 40 MG tablet Take 1 tablet (40 mg total) by mouth daily. Patient not taking: Reported on 02/23/2020 12/13/17 12/06/18  Melissa Friendly, MD  diclofenac Sodium (VOLTAREN) 1 % GEL Apply 4 g topically 4 (four) times daily. 02/23/20   Melissa, Shawn C, PA-Pham  gabapentin (NEURONTIN) 600 MG tablet Take 600 mg by mouth 3 (three) times daily.     [provider]  hydrocortisone (ANUSOL-HC) 25 MG suppository Place 1 suppository (25 mg total) rectally 2 (two) times daily. 02/23/20   Melissa, Shawn C, PA-Pham  Lidocaine-Glycerin (PREPARATION H RE) Place 1 application rectally as needed (hemmroids).    [provider]  metoprolol succinate (TOPROL-XL) 50 MG 24 hr tablet Take 50 mg by mouth daily. Take with or immediately following a meal.    [provider]  mometasone (NASONEX) 50 MCG/ACT nasal spray Place 2 sprays into the nose 2 (two) times daily as needed (allergies).     [provider]  Multiple Vitamins-Minerals (MULTIVITAMIN WITH MINERALS) tablet  Take 1 tablet by mouth daily.    [provider]  nitroGLYCERIN (NITROSTAT) 0.4 MG SL tablet Place 0.4 mg under the tongue every 5 (five) minutes as needed for chest pain.    [provider]  omeprazole (PRILOSEC) 20 MG capsule Take 2 capsules (40 mg total) by mouth daily. 02/21/15   Melissa Poisson, MD  PARoxetine (PAXIL) 20 MG tablet Take 20 mg by mouth daily as needed (hotflashes).     [provider]  valACYclovir (VALTREX) 1000 MG tablet Take 1,000 mg by mouth daily.    [provider]    Family History Family History  Problem Relation Age of Onset  . Hyperlipidemia Mother   . Arthritis Mother   . Heart attack Father   . Diabetes Father   . Hypertension Father   . Breast cancer Sister   . Cancer Sister        cervical  . Breast cancer Paternal Aunt   . Colon cancer Maternal Grandmother 85  . Esophageal cancer Neg Hx   . Rectal cancer Neg Hx     Social History Social History   Tobacco Use  . Smoking status: Current Every Day Smoker    Packs/day: 0.25    Years: 4.00    Pack years: 1.00    Types: Cigarettes  . Smokeless tobacco: Never Used  Substance Use Topics  . Alcohol use: No    Alcohol/week: 0.0 standard drinks  . Drug use: No    Comment: no drug use for 7 years     Allergies   Sulfa antibiotics   Review of Systems Review of Systems  Constitutional: Negative for activity change, fatigue and fever.  Respiratory: Positive for chest tightness. Negative for shortness of breath and wheezing.   Cardiovascular: Positive for chest pain. Negative for palpitations.  Gastrointestinal: Negative for abdominal pain, anal bleeding, nausea and vomiting.  Neurological: Positive for dizziness. Negative for weakness, light-headedness and headaches.  Psychiatric/Behavioral: Negative.      Physical Exam Triage Vital Signs ED Triage Vitals  Enc Vitals Group     BP 02/23/20 1144 (!) 128/51     Pulse Rate 02/23/20 1144 84     Resp  02/23/20 1144 16     Temp 02/23/20 1144 98 F (36.7 Pham)     Temp src --      SpO2 02/23/20 1144 93 %     Weight --      Height --      Head Circumference --      Peak Flow --      Pain Score 02/23/20 1145  8     Pain Loc --      Pain Edu? --      Excl. in [place]? --    No data found.  Updated Vital Signs BP (!) 128/51   Pulse 84   Temp 98 F (36.7 Pham)   Resp 16   SpO2 93%   Visual Acuity Right Eye Distance:   Left Eye Distance:   Bilateral Distance:    Right Eye Near:   Left Eye Near:    Bilateral Near:     Physical Exam Vitals and nursing note reviewed.  Constitutional:      General: She is not in acute distress.    Appearance: She is not ill-appearing.  Cardiovascular:     Rate and Rhythm: Normal rate and regular rhythm.     Heart sounds: Normal heart sounds. Heart sounds not distant. No murmur. No friction rub. No gallop. No S3 or S4 sounds.      Comments: Tenderness to palpation over the left chest. Pulmonary:     Effort: Pulmonary effort is normal. No tachypnea or accessory muscle usage.     Breath sounds: No stridor. No decreased breath sounds, wheezing or rhonchi.  Chest:     Chest wall: Tenderness present. No crepitus.  Abdominal:     General: Bowel sounds are normal. There is no abdominal bruit.     Palpations: Abdomen is soft. There is no fluid wave.  Neurological:     Mental Status: She is alert.      UC Treatments / Results  Labs (all labs ordered are listed, but only abnormal results are displayed) Labs Reviewed - No data to display  EKG   Radiology DG Chest 2 View  Result Date: 02/23/2020 CLINICAL DATA:  54 year old female with chest and back pain for 1 week. Irregular heartbeat. EXAM: CHEST - 2 VIEW COMPARISON:  Chest radiographs 12/06/2018 and earlier. FINDINGS: Lung volumes and mediastinal contours remain normal. Visualized tracheal air column is within normal limits. Both lungs appear stable and clear. No pneumothorax or pleural effusion.  No osseous abnormality identified. Negative visible bowel gas pattern. IMPRESSION: Negative.  No cardiopulmonary abnormality. Electronically Signed   By: Genevie Ann M.D.   On: 02/23/2020 13:53    Procedures Procedures (including critical care time)  Medications Ordered in UC Medications - No data to display  Initial Impression / Assessment and Plan / UC Course  I have reviewed the triage vital signs and the nursing notes.  Pertinent labs & imaging results that were available during my care of the patient were reviewed by me and considered in my medical decision making (see chart for details).     1.  Chest pain with cardiac risk factors: EKG showed normal sinus rhythm with T wave abnormalities in the anterolateral leads. Patient is advised to go to the emergency department for further evaluation.  Patient had stress test 2 years ago.  Stress test was negative for any reversible ischemic changes.  Patient agrees to go to ED to be evaluated further. Final Clinical Impressions(s) / UC Diagnoses   Final diagnoses:  Other forms of angina pectoris [facility])   Discharge Instructions   None    ED Prescriptions    None     PDMP not reviewed this encounter.   Chase Picket, MD 02/24/20 [phone]

## 2020-03-09 DIAGNOSIS — K580 Irritable bowel syndrome with diarrhea: Secondary | ICD-10-CM | POA: Diagnosis not present

## 2020-03-09 DIAGNOSIS — K219 Gastro-esophageal reflux disease without esophagitis: Secondary | ICD-10-CM | POA: Diagnosis not present

## 2020-03-09 DIAGNOSIS — M1990 Unspecified osteoarthritis, unspecified site: Secondary | ICD-10-CM | POA: Diagnosis not present

## 2020-03-09 DIAGNOSIS — R1013 Epigastric pain: Secondary | ICD-10-CM | POA: Diagnosis not present

## 2020-03-09 DIAGNOSIS — E782 Mixed hyperlipidemia: Secondary | ICD-10-CM | POA: Diagnosis not present

## 2020-03-09 DIAGNOSIS — E119 Type 2 diabetes mellitus without complications: Secondary | ICD-10-CM | POA: Diagnosis not present

## 2020-03-09 DIAGNOSIS — J45909 Unspecified asthma, uncomplicated: Secondary | ICD-10-CM | POA: Diagnosis not present

## 2020-03-09 DIAGNOSIS — Z87891 Personal history of nicotine dependence: Secondary | ICD-10-CM | POA: Diagnosis not present

## 2020-03-09 DIAGNOSIS — K641 Second degree hemorrhoids: Secondary | ICD-10-CM | POA: Diagnosis not present

## 2020-03-09 DIAGNOSIS — E1140 Type 2 diabetes mellitus with diabetic neuropathy, unspecified: Secondary | ICD-10-CM | POA: Diagnosis not present

## 2020-04-02 DIAGNOSIS — E119 Type 2 diabetes mellitus without complications: Secondary | ICD-10-CM | POA: Diagnosis not present

## 2020-04-02 DIAGNOSIS — H524 Presbyopia: Secondary | ICD-10-CM | POA: Diagnosis not present

## 2020-05-01 DIAGNOSIS — E1140 Type 2 diabetes mellitus with diabetic neuropathy, unspecified: Secondary | ICD-10-CM | POA: Diagnosis not present

## 2020-05-01 DIAGNOSIS — R1013 Epigastric pain: Secondary | ICD-10-CM | POA: Diagnosis not present

## 2020-05-01 DIAGNOSIS — E782 Mixed hyperlipidemia: Secondary | ICD-10-CM | POA: Diagnosis not present

## 2020-05-01 DIAGNOSIS — E119 Type 2 diabetes mellitus without complications: Secondary | ICD-10-CM | POA: Diagnosis not present

## 2020-05-01 DIAGNOSIS — R05 Cough: Secondary | ICD-10-CM | POA: Diagnosis not present

## 2020-06-20 DIAGNOSIS — E782 Mixed hyperlipidemia: Secondary | ICD-10-CM | POA: Diagnosis not present

## 2020-06-20 DIAGNOSIS — Z0000 Encounter for general adult medical examination without abnormal findings: Secondary | ICD-10-CM | POA: Diagnosis not present

## 2020-06-20 DIAGNOSIS — R1013 Epigastric pain: Secondary | ICD-10-CM | POA: Diagnosis not present

## 2020-06-20 DIAGNOSIS — Z1329 Encounter for screening for other suspected endocrine disorder: Secondary | ICD-10-CM | POA: Diagnosis not present

## 2020-06-20 DIAGNOSIS — Z0001 Encounter for general adult medical examination with abnormal findings: Secondary | ICD-10-CM | POA: Diagnosis not present

## 2020-06-20 DIAGNOSIS — E1140 Type 2 diabetes mellitus with diabetic neuropathy, unspecified: Secondary | ICD-10-CM | POA: Diagnosis not present

## 2020-06-20 DIAGNOSIS — R05 Cough: Secondary | ICD-10-CM | POA: Diagnosis not present

## 2020-08-08 ENCOUNTER — Other Ambulatory Visit: Payer: Self-pay | Admitting: Critical Care Medicine

## 2020-08-08 DIAGNOSIS — Z1231 Encounter for screening mammogram for malignant neoplasm of breast: Secondary | ICD-10-CM

## 2020-08-20 NOTE — Progress Notes (Deleted)
   Subjective:    Patient ID: Melissa Pham, female    DOB: [birth year], 54 y.o.   MRN: [id number]  53 y.o.F hx CAD, HTN, T2DM, Bipolar, Schizophrenia, obesity here to est PCP  08/21/20      Review of Systems     Objective:   Physical Exam        Assessment & Plan:

## 2020-08-21 ENCOUNTER — Ambulatory Visit: Payer: Medicare Other | Admitting: Critical Care Medicine

## 2020-08-29 DIAGNOSIS — A609 Anogenital herpesviral infection, unspecified: Secondary | ICD-10-CM | POA: Diagnosis not present

## 2020-08-29 DIAGNOSIS — Z113 Encounter for screening for infections with a predominantly sexual mode of transmission: Secondary | ICD-10-CM | POA: Diagnosis not present

## 2020-08-29 DIAGNOSIS — N760 Acute vaginitis: Secondary | ICD-10-CM | POA: Diagnosis not present

## 2020-09-08 ENCOUNTER — Ambulatory Visit: Payer: Medicare Other

## 2020-09-13 DIAGNOSIS — E1140 Type 2 diabetes mellitus with diabetic neuropathy, unspecified: Secondary | ICD-10-CM | POA: Diagnosis not present

## 2020-09-13 DIAGNOSIS — R1013 Epigastric pain: Secondary | ICD-10-CM | POA: Diagnosis not present

## 2020-09-13 DIAGNOSIS — E119 Type 2 diabetes mellitus without complications: Secondary | ICD-10-CM | POA: Diagnosis not present

## 2020-09-13 DIAGNOSIS — E782 Mixed hyperlipidemia: Secondary | ICD-10-CM | POA: Diagnosis not present

## 2020-09-13 DIAGNOSIS — M1990 Unspecified osteoarthritis, unspecified site: Secondary | ICD-10-CM | POA: Diagnosis not present

## 2020-10-03 DIAGNOSIS — Z20822 Contact with and (suspected) exposure to covid-19: Secondary | ICD-10-CM | POA: Diagnosis not present

## 2020-10-10 DIAGNOSIS — Z23 Encounter for immunization: Secondary | ICD-10-CM | POA: Diagnosis not present

## 2020-10-24 ENCOUNTER — Ambulatory Visit: Payer: Medicare Other

## 2020-12-01 DIAGNOSIS — E782 Mixed hyperlipidemia: Secondary | ICD-10-CM | POA: Diagnosis not present

## 2020-12-01 DIAGNOSIS — E119 Type 2 diabetes mellitus without complications: Secondary | ICD-10-CM | POA: Diagnosis not present

## 2020-12-01 DIAGNOSIS — Z87891 Personal history of nicotine dependence: Secondary | ICD-10-CM | POA: Diagnosis not present

## 2020-12-01 DIAGNOSIS — M1990 Unspecified osteoarthritis, unspecified site: Secondary | ICD-10-CM | POA: Diagnosis not present

## 2020-12-01 DIAGNOSIS — J45909 Unspecified asthma, uncomplicated: Secondary | ICD-10-CM | POA: Diagnosis not present

## 2020-12-01 DIAGNOSIS — R1013 Epigastric pain: Secondary | ICD-10-CM | POA: Diagnosis not present

## 2020-12-01 DIAGNOSIS — E1140 Type 2 diabetes mellitus with diabetic neuropathy, unspecified: Secondary | ICD-10-CM | POA: Diagnosis not present

## 2020-12-01 DIAGNOSIS — K219 Gastro-esophageal reflux disease without esophagitis: Secondary | ICD-10-CM | POA: Diagnosis not present

## 2020-12-22 ENCOUNTER — Ambulatory Visit: Payer: Medicare Other

## 2021-02-01 DIAGNOSIS — E782 Mixed hyperlipidemia: Secondary | ICD-10-CM | POA: Diagnosis not present

## 2021-02-01 DIAGNOSIS — E119 Type 2 diabetes mellitus without complications: Secondary | ICD-10-CM | POA: Diagnosis not present

## 2021-02-01 DIAGNOSIS — M1990 Unspecified osteoarthritis, unspecified site: Secondary | ICD-10-CM | POA: Diagnosis not present

## 2021-02-01 DIAGNOSIS — K591 Functional diarrhea: Secondary | ICD-10-CM | POA: Diagnosis not present

## 2021-02-01 DIAGNOSIS — I10 Essential (primary) hypertension: Secondary | ICD-10-CM | POA: Diagnosis not present

## 2021-03-05 DIAGNOSIS — R059 Cough, unspecified: Secondary | ICD-10-CM | POA: Diagnosis not present

## 2021-03-05 DIAGNOSIS — R062 Wheezing: Secondary | ICD-10-CM | POA: Diagnosis not present

## 2021-03-05 DIAGNOSIS — M791 Myalgia, unspecified site: Secondary | ICD-10-CM | POA: Diagnosis not present

## 2021-03-05 DIAGNOSIS — U071 COVID-19: Secondary | ICD-10-CM | POA: Diagnosis not present

## 2021-03-12 ENCOUNTER — Ambulatory Visit: Payer: Medicare Other

## 2021-04-14 ENCOUNTER — Other Ambulatory Visit: Payer: Self-pay

## 2021-04-14 ENCOUNTER — Emergency Department (HOSPITAL_COMMUNITY): Payer: Medicare Other

## 2021-04-14 ENCOUNTER — Emergency Department (HOSPITAL_COMMUNITY)
Admission: EM | Admit: 2021-04-14 | Discharge: 2021-04-15 | Disposition: A | Discharge status: Home / Self Care | Payer: Medicare Other | Attending: Emergency Medicine | Admitting: Emergency Medicine

## 2021-04-14 DIAGNOSIS — M542 Cervicalgia: Secondary | ICD-10-CM | POA: Diagnosis not present

## 2021-04-14 DIAGNOSIS — Z20822 Contact with and (suspected) exposure to covid-19: Secondary | ICD-10-CM | POA: Insufficient documentation

## 2021-04-14 DIAGNOSIS — R220 Localized swelling, mass and lump, head: Secondary | ICD-10-CM | POA: Insufficient documentation

## 2021-04-14 DIAGNOSIS — Z5321 Procedure and treatment not carried out due to patient leaving prior to being seen by health care provider: Secondary | ICD-10-CM | POA: Insufficient documentation

## 2021-04-14 LAB — CBC WITH DIFFERENTIAL/PLATELET
Abs Immature Granulocytes: 0.01 10*3/uL (ref 0.00–0.07)
Basophils Absolute: 0 10*3/uL (ref 0.0–0.1)
Basophils Relative: 0 %
Eosinophils Absolute: 0.2 10*3/uL (ref 0.0–0.5)
Eosinophils Relative: 3 %
HCT: 35 % — ABNORMAL LOW (ref 36.0–46.0)
Hemoglobin: 11.4 g/dL — ABNORMAL LOW (ref 12.0–15.0)
Immature Granulocytes: 0 %
Lymphocytes Relative: 40 %
Lymphs Abs: 2.2 10*3/uL (ref 0.7–4.0)
MCH: 29 pg (ref 26.0–34.0)
MCHC: 32.6 g/dL (ref 30.0–36.0)
MCV: 89.1 fL (ref 80.0–100.0)
Monocytes Absolute: 0.5 10*3/uL (ref 0.1–1.0)
Monocytes Relative: 9 %
Neutro Abs: 2.6 10*3/uL (ref 1.7–7.7)
Neutrophils Relative %: 48 %
Platelets: 355 10*3/uL (ref 150–400)
RBC: 3.93 MIL/uL (ref 3.87–5.11)
RDW: 14 % (ref 11.5–15.5)
WBC: 5.4 10*3/uL (ref 4.0–10.5)
nRBC: 0 % (ref 0.0–0.2)

## 2021-04-14 LAB — BASIC METABOLIC PANEL
Anion gap: 8 (ref 5–15)
BUN: 11 mg/dL (ref 6–20)
CO2: 27 mmol/L (ref 22–32)
Calcium: 9 mg/dL (ref 8.9–10.3)
Chloride: 107 mmol/L (ref 98–111)
Creatinine, Ser: 0.69 mg/dL (ref 0.44–1.00)
GFR, Estimated: >60 mL/min (ref >60)
Glucose, Bld: 101 mg/dL — ABNORMAL HIGH (ref 70–99)
Potassium: 3.2 mmol/L — ABNORMAL LOW (ref 3.5–5.1)
Sodium: 142 mmol/L (ref 135–145)

## 2021-04-14 LAB — GROUP A STREP BY PCR: Group A Strep by PCR: NOT DETECTED

## 2021-04-14 LAB — RESP PANEL BY RT-PCR (FLU A&B, COVID) ARPGX2
Influenza A by PCR: NEGATIVE
Influenza B by PCR: NEGATIVE
SARS Coronavirus 2 by RT PCR: NEGATIVE

## 2021-04-14 NOTE — ED Triage Notes (Signed)
Pt arrive POV from home for c/o oral swollen and neck pain for the past 5 days with no relief. Pt states she got a negative test at home.

## 2021-04-14 NOTE — ED Provider Notes (Signed)
Emergency Medicine Provider Triage Evaluation Note  Melissa Pham , Pham 55 y.o. female  was evaluated in triage.  Pt complains of upper respiratory complaints.  She has had Pham cough since Tuesday.  Nonproductive.  Was exposed grandson who had COVID about Pham month ago however she tested negative.  She states she feels like she has Pham sore throat.  She feels like the inside of her mouth is swollen.  No sensation of throat closing.  No chest pain.  No changes in medications.  Has been using Chloraseptic without relief.  She had congestion and rhinorrhea.  States she feels generally unwell  Review of Systems  Positive: Sore throat, cough, congestion, sensation of mouth swelling Negative:   Physical Exam  BP (!) 173/108 (BP Location: Right Arm)   Pulse 82   Temp 98.5 F (36.9 C) (Oral)   Resp 18   Ht 5\' 2"  (1.575 m)   Wt 91.6 kg   SpO2 100%   BMI 36.95 kg/m  Gen:   Awake, no distress   Mouth:  Posterior oropharynx clear.  Tongue midline.  No evidence of intraoral edema.  Sublingual area soft.  No edema to tongue, lips, no obvious PTA or RPA. Neck:  No stridor.  Speaks in full sentences without difficulty Resp:  Normal effort, clear to auscultation bilaterally MSK:   Moves extremities without difficulty  Other:    Medical Decision Making  Medically screening exam initiated at 7:49 PM.  Appropriate orders placed.  Melissa Pham was informed that the remainder of the evaluation will be completed by another provider, this initial triage assessment does not replace that evaluation, and the importance of remaining in the ED until their evaluation is complete.  Cough, sore throat  Feels like her mouth is been swollen for 4 days however no evidence of airway compromise, no appreciable edema on exam.  Labs, imaging ordered, stable   Melissa Collantes A, PA-C 04/14/21 1949    [name], MD 04/14/21 2003

## 2021-04-18 ENCOUNTER — Other Ambulatory Visit: Payer: Self-pay

## 2021-04-18 ENCOUNTER — Encounter (HOSPITAL_COMMUNITY): Payer: Self-pay | Admitting: Emergency Medicine

## 2021-04-18 ENCOUNTER — Ambulatory Visit (HOSPITAL_COMMUNITY)
Admission: EM | Admit: 2021-04-18 | Discharge: 2021-04-18 | Disposition: A | Discharge status: Home / Self Care | Payer: Medicare Other | Attending: Family | Admitting: Family

## 2021-04-18 ENCOUNTER — Ambulatory Visit
Admit: 2021-04-18 | Discharge: 2021-04-18 | Disposition: A | Discharge status: Home / Self Care | Payer: Medicare Other | Attending: Critical Care Medicine | Admitting: Critical Care Medicine

## 2021-04-18 DIAGNOSIS — K1370 Unspecified lesions of oral mucosa: Secondary | ICD-10-CM | POA: Diagnosis not present

## 2021-04-18 DIAGNOSIS — R059 Cough, unspecified: Secondary | ICD-10-CM | POA: Diagnosis not present

## 2021-04-18 DIAGNOSIS — Z1231 Encounter for screening mammogram for malignant neoplasm of breast: Secondary | ICD-10-CM

## 2021-04-18 DIAGNOSIS — K1379 Other lesions of oral mucosa: Secondary | ICD-10-CM | POA: Diagnosis not present

## 2021-04-18 DIAGNOSIS — R590 Localized enlarged lymph nodes: Secondary | ICD-10-CM | POA: Diagnosis not present

## 2021-04-18 MED ORDER — CLOTRIMAZOLE 10 MG MT TROC: 10.0000 mg | Freq: Every day | OROMUCOSAL | 0 refills | Status: DC

## 2021-04-18 MED ORDER — LIDOCAINE VISCOUS HCL 2 % MT SOLN: 10.0000 mL | OROMUCOSAL | 0 refills | Status: DC | PRN

## 2021-04-18 NOTE — Discharge Instructions (Addendum)
Recommend start Mycelex troches- use one 5 times a day as directed for at least 7 days until resolved. May use Viscous Lidocaine take 2 teaspoons, mix with small amount of Listerine and swish and spit out every 3 hours as needed- do not swallow. Continue to monitor symptoms. If not improving, go to your PCP or to a Ear, Nose and Throat specialist.

## 2021-04-18 NOTE — ED Provider Notes (Signed)
[facility]    CSN: [id number] Arrival date & time: 04/18/21  1025      History   Chief Complaint Chief Complaint  Patient presents with   Otalgia   Sore Throat   Cough   Diarrhea    HPI Melissa Pham is a 55 y.o. female.   55 year old female presents with continued sore throat and lesions inside her lips and mouth/throat. Started over 1 week ago. Also having bilateral ear pain and cough. Denies any fever or chills. Does have some nasal congestion and diarrhea. Went to [facility] ER 4 days ago for "swollen throat"- no airway compromise and had an initial evaluation done but left after triage. Did have various labs done while at ER- Rapid strep test was negative. Also had COVID test, influenza and chest x-ray that were all negative. Low potassium level but normal kidney function. Slightly anemic but normal WBC count. She has tried Chloraseptic spray with minimal relief. Did recently take antibiotics. Smokes tobacco daily. Other chronic health issues include HTN, hyperlipidemia, GERD, type 2 DM, asthma, bipolar disorder and schizophrenia, arthritis, and history of HSV. Currently on Toprol XL, Gabapentin, Paxil, Valtrex, Prilosec, aspirin, and Nasonex daily and Nitroglycerin, Tylenol and Albuterol prn.   The history is provided by the patient.   Past Medical History:  Diagnosis Date   ADHD (attention deficit hyperactivity disorder)    Anxiety    Asthma    Bipolar disorder ([place])    Blood transfusion without reported diagnosis 1985   after childbirth   Depression    Diabetes mellitus without complication ([place])    GERD (gastroesophageal reflux disease)    Hyperlipidemia    Hypertension    Schizophrenia ([place])    Sickle cell trait ([place])     Patient Active Problem List   Diagnosis Date Noted   Syncope 12/06/2018   CAD in native artery    Tobacco abuse 07/27/2018   Pure hypercholesterolemia    Generalized abdominal discomfort 12/10/2017   Effusion, right knee  03/13/2017   Sprain of right knee 03/13/2017   Chest pain 02/20/2015   Suprapubic pain 02/20/2015   Diarrhea 02/20/2015   Diabetes mellitus, type 2 ([place]) 02/20/2015   HTN (hypertension) 02/20/2015   Dyslipidemia 02/20/2015   Bipolar affective disorder ([place]) 02/20/2015   Schizophrenia ([place]) 02/20/2015   Hypokalemia 02/20/2015    Past Surgical History:  Procedure Laterality Date   CERVICAL BIOPSY  W/ LOOP ELECTRODE EXCISION     left eye surgery  1970   TUBAL LIGATION  1989    OB History     Gravida  7   Para      Term      Preterm      AB  3   Living  4      SAB  1   IAB  2   Ectopic      Multiple      Live Births               Home Medications    Prior to Admission medications   Medication Sig Start Date End Date Taking? Authorizing Provider  clotrimazole (MYCELEX) 10 MG troche Take 1 tablet (10 mg total) by mouth 5 (five) times daily. 04/18/21  Yes Kenyana Husak, Nicholes Stairs, NP  lidocaine (XYLOCAINE) 2 % solution Use as directed 10 mLs in the mouth or throat every 3 (three) hours as needed for mouth pain (Swish and Spit out- do not swallow). 04/18/21  Yes  Katy Apo, NP  acetaminophen (TYLENOL) 500 MG tablet Take 1,000 mg by mouth every 6 (six) hours as needed for mild pain.    [provider]  albuterol (PROVENTIL HFA;VENTOLIN HFA) 108 (90 BASE) MCG/ACT inhaler Inhale 2 puffs into the lungs every 6 (six) hours as needed for wheezing or shortness of breath.     [provider]  albuterol (PROVENTIL) (2.5 MG/3ML) 0.083% nebulizer solution Take 2.5 mg by nebulization every 6 (six) hours as needed for wheezing or shortness of breath.    [provider]  aspirin EC 81 MG tablet Take 81 mg by mouth daily.    [provider]  atorvastatin (LIPITOR) 40 MG tablet Take 1 tablet (40 mg total) by mouth daily. Patient not taking: Reported on 02/23/2020 12/13/17 12/06/18  Alma Friendly, MD  diclofenac Sodium (VOLTAREN) 1 % GEL Apply  4 g topically 4 (four) times daily. 02/23/20   Joy, Shawn C, PA-C  gabapentin (NEURONTIN) 600 MG tablet Take 600 mg by mouth 3 (three) times daily.     [provider]  hydrocortisone (ANUSOL-HC) 25 MG suppository Place 1 suppository (25 mg total) rectally 2 (two) times daily. 02/23/20   Joy, Shawn C, PA-C  Lidocaine-Glycerin (PREPARATION H RE) Place 1 application rectally as needed (hemmroids).    [provider]  metoprolol succinate (TOPROL-XL) 50 MG 24 hr tablet Take 50 mg by mouth daily. Take with or immediately following a meal.    [provider]  mometasone (NASONEX) 50 MCG/ACT nasal spray Place 2 sprays into the nose 2 (two) times daily as needed (allergies).     [provider]  Multiple Vitamins-Minerals (MULTIVITAMIN WITH MINERALS) tablet Take 1 tablet by mouth daily.    [provider]  nitroGLYCERIN (NITROSTAT) 0.4 MG SL tablet Place 0.4 mg under the tongue every 5 (five) minutes as needed for chest pain.    [provider]  omeprazole (PRILOSEC) 20 MG capsule Take 2 capsules (40 mg total) by mouth daily. 02/21/15   Hosie Poisson, MD  PARoxetine (PAXIL) 20 MG tablet Take 20 mg by mouth daily as needed (hotflashes).     [provider]  valACYclovir (VALTREX) 1000 MG tablet Take 1,000 mg by mouth daily.    [provider]    Family History Family History  Problem Relation Age of Onset   Hyperlipidemia Mother    Arthritis Mother    Heart attack Father    Diabetes Father    Hypertension Father    Cancer Sister        cervical   Breast cancer Sister 64   Breast cancer Sister    Breast cancer Paternal Aunt    Colon cancer Maternal Grandmother 97   Esophageal cancer Neg Hx    Rectal cancer Neg Hx     Social History Social History   Tobacco Use   Smoking status: Every Day    Packs/day: 0.25    Years: 4.00    Pack years: 1.00    Types: Cigarettes   Smokeless tobacco: Never  Vaping Use   Vaping Use:  Never used  Substance Use Topics   Alcohol use: No    Alcohol/week: 0.0 standard drinks   Drug use: No    Comment: no drug use for 7 years     Allergies   Sulfa antibiotics   Review of Systems Review of Systems  Constitutional:  Positive for fatigue. Negative for activity change, appetite change, chills and fever.  HENT:  Positive for congestion, ear pain, mouth sores, postnasal drip, sore throat and trouble swallowing. Negative for ear discharge, facial swelling, nosebleeds, sinus pressure and sinus pain.   Eyes:  Negative for pain, discharge, redness and itching.  Respiratory:  Positive for cough and chest tightness. Negative for shortness of breath and wheezing.   Gastrointestinal:  Positive for diarrhea and nausea. Negative for vomiting.  Musculoskeletal:  Positive for arthralgias and myalgias. Negative for neck pain and neck stiffness.  Skin:  Negative for color change and rash.  Allergic/Immunologic: Positive for environmental allergies. Negative for food allergies.  Neurological:  Positive for headaches. Negative for dizziness, seizures, syncope, speech difficulty, weakness and numbness.  Hematological:  Negative for adenopathy. Does not bruise/bleed easily.  Psychiatric/Behavioral:  The patient is nervous/anxious and is hyperactive.     Physical Exam Triage Vital Signs ED Triage Vitals  Enc Vitals Group     BP 04/18/21 1112 107/68     Pulse Rate 04/18/21 1112 81     Resp 04/18/21 1112 18     Temp 04/18/21 1114 98.6 F (37 C)     Temp Source 04/18/21 1114 Oral     SpO2 04/18/21 1112 99 %     Weight --      Height --      Head Circumference --      Peak Flow --      Pain Score 04/18/21 1113 7     Pain Loc --      Pain Edu? --      Excl. in [place]? --    No data found.  Updated Vital Signs BP 107/68   Pulse 81   Temp 98.6 F (37 C) (Oral)   Resp 18   LMP 07/21/2013   SpO2 99%   Visual Acuity Right Eye Distance:   Left Eye Distance:   Bilateral Distance:     Right Eye Near:   Left Eye Near:    Bilateral Near:     Physical Exam Vitals and nursing note reviewed.  Constitutional:      General: She is awake.     Appearance: She is well-developed and well-groomed.     Comments: She is sitting on the exam table in no acute distress but appears tired and talks non-stop.   HENT:     Head: Normocephalic and atraumatic.     Right Ear: Hearing, tympanic membrane, ear canal and external ear normal.     Left Ear: Hearing, tympanic membrane, ear canal and external ear normal.     Nose: Congestion present.     Right Sinus: No maxillary sinus tenderness or frontal sinus tenderness.     Left Sinus: No maxillary sinus tenderness or frontal sinus tenderness.     Mouth/Throat:     Lips: Lesions present.     Mouth: Mucous membranes are moist. Oral lesions present.     Dentition: Gum lesions present. No dental tenderness.     Tongue: Lesions present.     Pharynx: Uvula midline. Posterior oropharyngeal erythema present. No pharyngeal swelling, oropharyngeal exudate or uvula swelling.      Comments: Multiple red papular-like lesions with white coating or film present on lateral aspect of tongue, lower inner lip, upper anterior palate.Multiple areas of irritation. No distinct bleeding. Very tender. No distinct ulcers.  Cardiovascular:     Rate and Rhythm: Normal rate and regular rhythm.     Heart sounds: Normal heart sounds.  Pulmonary:     Effort: Pulmonary effort  is normal. No respiratory distress.     Breath sounds: Normal breath sounds and air entry. No decreased air movement. No decreased breath sounds, wheezing or rhonchi.  Musculoskeletal:     Cervical back: Normal range of motion and neck supple.  Lymphadenopathy:     Cervical: Cervical adenopathy present.     Right cervical: Superficial cervical adenopathy present.     Left cervical: Superficial cervical adenopathy present.  Skin:    General: Skin is warm and dry.     Capillary Refill:  Capillary refill takes less than 2 seconds.     Findings: Lesion (oral only) present. No bruising, ecchymosis, erythema, petechiae or rash.  Neurological:     General: No focal deficit present.     Mental Status: She is alert and oriented to person, place, and time.  Psychiatric:        Attention and Perception: Attention normal.        Mood and Affect: Mood is anxious.        Speech: Speech is rapid and pressured.        Behavior: Behavior is hyperactive. Behavior is cooperative.        Thought Content: Thought content normal.        Cognition and Memory: Cognition normal.     Comments: Patient was talking almost non-stop while provider in room. Very anxious and appears in maniac phase.      UC Treatments / Results  Labs (all labs ordered are listed, but only abnormal results are displayed) Labs Reviewed - No data to display  EKG   Radiology No results found.  Procedures Procedures (including critical care time)  Medications Ordered in UC Medications - No data to display  Initial Impression / Assessment and Plan / UC Course  I have reviewed the triage vital signs and the nursing notes.  Pertinent labs & imaging results that were available during my care of the patient were reviewed by me and considered in my medical decision making (see chart for details).     Reviewed with patient that she may have a fungal infection- thrush like in mouth and throat. May be due to recent antibiotic use and chronic use of Nasonex. Discussed that lesions could also be viral. Will treat for possible fungal infection- use Mycelex troches 5 times a day for at least 7 days until resolved. May use Viscous Lidocaine 2 teaspoons and swish and spit out every 3 hours as needed- do not swallow. May continue Tylenol 1000mg  every 8 hours as needed for pain. Continue to push fluids to help loosen up any mucus in chest. Note written to participate in activities/work. Continue to monitor symptoms. If not  improving within 5 to 7 days, follow-up with her PCP or go to an ENT specialist.  Final Clinical Impressions(s) / UC Diagnoses   Final diagnoses:  Unspecified lesions of oral mucosa  Acute pain of mouth  Cough  Lymphadenopathy, anterior cervical     Discharge Instructions      Recommend start Mycelex troches- use one 5 times a day as directed for at least 7 days until resolved. May use Viscous Lidocaine take 2 teaspoons, mix with small amount of Listerine and swish and spit out every 3 hours as needed- do not swallow. Continue to monitor symptoms. If not improving, go to your PCP or to a Ear, Nose and Throat specialist.      ED Prescriptions     Medication Sig Dispense Auth. Provider   lidocaine (XYLOCAINE)  2 % solution Use as directed 10 mLs in the mouth or throat every 3 (three) hours as needed for mouth pain (Swish and Spit out- do not swallow). 100 mL Katy Apo, NP   clotrimazole (MYCELEX) 10 MG troche Take 1 tablet (10 mg total) by mouth 5 (five) times daily. [place], Nicholes Stairs, NP      PDMP not reviewed this encounter.   Katy Apo, NP 04/19/21 2109

## 2021-04-18 NOTE — ED Triage Notes (Signed)
Pt is present today with a sore throat, ear pain, and a cough. Pt states that she also has some lesion on her tongue. Pt states that her sx started  last Tuesday.

## 2021-05-02 ENCOUNTER — Ambulatory Visit (INDEPENDENT_AMBULATORY_CARE_PROVIDER_SITE_OTHER): Payer: Medicare Other

## 2021-05-02 ENCOUNTER — Other Ambulatory Visit: Payer: Self-pay

## 2021-05-02 ENCOUNTER — Encounter (HOSPITAL_COMMUNITY): Payer: Self-pay

## 2021-05-02 ENCOUNTER — Ambulatory Visit (HOSPITAL_COMMUNITY)
Admission: EM | Admit: 2021-05-02 | Discharge: 2021-05-02 | Disposition: A | Discharge status: Home / Self Care | Payer: Medicare Other

## 2021-05-02 DIAGNOSIS — M79644 Pain in right finger(s): Secondary | ICD-10-CM

## 2021-05-02 DIAGNOSIS — S60011A Contusion of right thumb without damage to nail, initial encounter: Secondary | ICD-10-CM

## 2021-05-02 DIAGNOSIS — S6701XA Crushing injury of right thumb, initial encounter: Secondary | ICD-10-CM

## 2021-05-02 MED ORDER — IBUPROFEN 400 MG PO TABS: 400.0000 mg | ORAL_TABLET | Freq: Four times a day (QID) | ORAL | 0 refills | Status: DC | PRN

## 2021-05-02 NOTE — Discharge Instructions (Addendum)
He can use the thumb spica splint through the end of this week.  Use ibuprofen every 6 hours as needed for pain.  Take this medication with food.

## 2021-05-02 NOTE — ED Provider Notes (Signed)
[place]   MRN: [id number] DOB: 07-Jun-1966  Subjective:   Melissa Pham is a 55 y.o. female presenting for suffering a right thumb injury today.  Patient reports that she was around her dog who jumped up and unfortunately closed the door on her thumb.  She has had severe pain of the thumb without bony deformity, swelling.  Has not taken anything for pain control.  Wants to make sure its not fractured.  No current facility-administered medications for this encounter.  Current Outpatient Medications:    acetaminophen (TYLENOL) 500 MG tablet, Take 1,000 mg by mouth every 6 (six) hours as needed for mild pain., Disp: , Rfl:    albuterol (PROVENTIL HFA;VENTOLIN HFA) 108 (90 BASE) MCG/ACT inhaler, Inhale 2 puffs into the lungs every 6 (six) hours as needed for wheezing or shortness of breath. , Disp: , Rfl:    albuterol (PROVENTIL) (2.5 MG/3ML) 0.083% nebulizer solution, Take 2.5 mg by nebulization every 6 (six) hours as needed for wheezing or shortness of breath., Disp: , Rfl:    aspirin EC 81 MG tablet, Take 81 mg by mouth daily., Disp: , Rfl:    LISINOPRIL PO, Take by mouth., Disp: , Rfl:    metoprolol succinate (TOPROL-XL) 50 MG 24 hr tablet, Take 50 mg by mouth daily. Take with or immediately following a meal., Disp: , Rfl:    omeprazole (PRILOSEC) 20 MG capsule, Take 2 capsules (40 mg total) by mouth daily., Disp: 30 capsule, Rfl: 1   atorvastatin (LIPITOR) 40 MG tablet, Take 1 tablet (40 mg total) by mouth daily. (Patient not taking: Reported on 02/23/2020), Disp: 30 tablet, Rfl: 0   clotrimazole (MYCELEX) 10 MG troche, Take 1 tablet (10 mg total) by mouth 5 (five) times daily., Disp: 70 Troche, Rfl: 0   diclofenac Sodium (VOLTAREN) 1 % GEL, Apply 4 g topically 4 (four) times daily., Disp: 100 g, Rfl: 2   gabapentin (NEURONTIN) 600 MG tablet, Take 600 mg by mouth 3 (three) times daily. , Disp: , Rfl:    hydrocortisone (ANUSOL-HC) 25 MG suppository, Place 1 suppository  (25 mg total) rectally 2 (two) times daily., Disp: 12 suppository, Rfl: 0   lidocaine (XYLOCAINE) 2 % solution, Use as directed 10 mLs in the mouth or throat every 3 (three) hours as needed for mouth pain (Swish and Spit out- do not swallow)., Disp: 100 mL, Rfl: 0   Lidocaine-Glycerin (PREPARATION H RE), Place 1 application rectally as needed (hemmroids)., Disp: , Rfl:    mometasone (NASONEX) 50 MCG/ACT nasal spray, Place 2 sprays into the nose 2 (two) times daily as needed (allergies). , Disp: , Rfl:    Multiple Vitamins-Minerals (MULTIVITAMIN WITH MINERALS) tablet, Take 1 tablet by mouth daily., Disp: , Rfl:    nitroGLYCERIN (NITROSTAT) 0.4 MG SL tablet, Place 0.4 mg under the tongue every 5 (five) minutes as needed for chest pain., Disp: , Rfl:    PARoxetine (PAXIL) 20 MG tablet, Take 20 mg by mouth daily as needed (hotflashes). , Disp: , Rfl:    valACYclovir (VALTREX) 1000 MG tablet, Take 1,000 mg by mouth daily., Disp: , Rfl:    Allergies  Allergen Reactions   Sulfa Antibiotics Anaphylaxis    Past Medical History:  Diagnosis Date   ADHD (attention deficit hyperactivity disorder)    Anxiety    Asthma    Bipolar disorder ([place])    Blood transfusion without reported diagnosis 1985   after childbirth   Depression    Diabetes  mellitus without complication (HCC)    GERD (gastroesophageal reflux disease)    Hyperlipidemia    Hypertension    Schizophrenia (HCC)    Sickle cell trait (HCC)      Past Surgical History:  Procedure Laterality Date   CERVICAL BIOPSY  W/ LOOP ELECTRODE EXCISION     left eye surgery  [place]    Family History  Problem Relation Age of Onset   Hyperlipidemia Mother    Arthritis Mother    Parkinson's disease Mother    Heart attack Father    Diabetes Father    Hypertension Father    Cancer Sister        cervical   Breast cancer Sister 43   Breast cancer Sister    Colon cancer Maternal Grandmother 11   Breast cancer Paternal Aunt     Esophageal cancer Neg Hx    Rectal cancer Neg Hx     Social History   Tobacco Use   Smoking status: Every Day    Packs/day: 0.25    Years: 4.00    Pack years: 1.00    Types: Cigarettes   Smokeless tobacco: Never  Vaping Use   Vaping Use: Never used  Substance Use Topics   Alcohol use: No    Alcohol/week: 0.0 standard drinks   Drug use: No    Comment: no drug use for 7 years    ROS   Objective:   Vitals: BP 121/89   Pulse 87   Temp (!) 97.4 F (36.3 C)   Resp 18   LMP 07/21/2013   SpO2 100%   Physical Exam Constitutional:      General: She is not in acute distress.    Appearance: Normal appearance. She is well-developed. She is not ill-appearing, toxic-appearing or diaphoretic.  HENT:     Head: Normocephalic and atraumatic.     Nose: Nose normal.     Mouth/Throat:     Mouth: Mucous membranes are moist.     Pharynx: Oropharynx is clear.  Eyes:     General: No scleral icterus.       Right eye: No discharge.        Left eye: No discharge.     Extraocular Movements: Extraocular movements intact.     Conjunctiva/sclera: Conjunctivae normal.     Pupils: Pupils are equal, round, and reactive to light.  Cardiovascular:     Rate and Rhythm: Normal rate.  Pulmonary:     Effort: Pulmonary effort is normal.  Musculoskeletal:     Right hand: Tenderness (over right thumb as outlined) and bony tenderness present. No swelling, deformity or lacerations. Decreased range of motion. Normal strength. Normal sensation. There is no disruption of two-point discrimination. Normal capillary refill.       Hands:  Skin:    General: Skin is warm and dry.  Neurological:     General: No focal deficit present.     Mental Status: She is alert and oriented to person, place, and time.     Motor: No weakness.     Coordination: Coordination normal.     Gait: Gait normal.     Deep Tendon Reflexes: Reflexes normal.  Psychiatric:        Mood and Affect: Mood normal.        Behavior:  Behavior normal.        Thought Content: Thought content normal.        Judgment: Judgment normal.  DG Finger Thumb Right  Result Date: 05/02/2021 CLINICAL DATA:  Door closed on thumb EXAM: RIGHT THUMB 2+V COMPARISON:  None. FINDINGS: Soft tissue swelling of the first digit. No soft tissue gas or foreign body. No acute fracture or traumatic malalignment. No significantly age advanced arthrosis or evidence of underlying arthropathy. IMPRESSION: No acute fracture or traumatic malalignment. Electronically Signed   By: Lovena Le M.D.   On: 05/02/2021 15:35     Assessment and Plan :   PDMP not reviewed this encounter.  1. Pain of right thumb   2. Crushing injury of right thumb, initial encounter   3. Contusion of right thumb without damage to nail, initial encounter     Recommended conservative management with thumb spica, NSAID, rest. Counseled patient on potential for adverse effects with medications prescribed/recommended today, ER and return-to-clinic precautions discussed, patient verbalized understanding.    Jaynee Eagles, [place] 05/02/21 1541

## 2021-05-02 NOTE — ED Triage Notes (Addendum)
Pt reports injuring right thumb about 2 hours ago. Reports dog jumped up causing door to close on thumb. No open areas but pt reports swelling and burning pain.  Unable to update pt's medication list at this time as pt unsure what medications she is taking.

## 2021-05-19 DIAGNOSIS — H524 Presbyopia: Secondary | ICD-10-CM | POA: Diagnosis not present

## 2021-05-19 DIAGNOSIS — E119 Type 2 diabetes mellitus without complications: Secondary | ICD-10-CM | POA: Diagnosis not present

## 2021-05-21 ENCOUNTER — Emergency Department (HOSPITAL_COMMUNITY): Payer: Medicare Other

## 2021-05-21 ENCOUNTER — Emergency Department (HOSPITAL_COMMUNITY)
Admission: EM | Admit: 2021-05-21 | Discharge: 2021-05-22 | Disposition: A | Discharge status: Home / Self Care | Payer: Medicare Other | Attending: Emergency Medicine | Admitting: Emergency Medicine

## 2021-05-21 ENCOUNTER — Other Ambulatory Visit: Payer: Self-pay

## 2021-05-21 ENCOUNTER — Encounter (HOSPITAL_COMMUNITY): Payer: Self-pay | Admitting: Emergency Medicine

## 2021-05-21 DIAGNOSIS — M79602 Pain in left arm: Secondary | ICD-10-CM | POA: Insufficient documentation

## 2021-05-21 DIAGNOSIS — Z5321 Procedure and treatment not carried out due to patient leaving prior to being seen by health care provider: Secondary | ICD-10-CM | POA: Insufficient documentation

## 2021-05-21 DIAGNOSIS — M542 Cervicalgia: Secondary | ICD-10-CM | POA: Insufficient documentation

## 2021-05-21 DIAGNOSIS — R0602 Shortness of breath: Secondary | ICD-10-CM | POA: Diagnosis not present

## 2021-05-21 DIAGNOSIS — R079 Chest pain, unspecified: Secondary | ICD-10-CM | POA: Diagnosis not present

## 2021-05-21 DIAGNOSIS — R0789 Other chest pain: Secondary | ICD-10-CM | POA: Diagnosis not present

## 2021-05-21 LAB — CBC WITH DIFFERENTIAL/PLATELET
Abs Immature Granulocytes: 0.03 10*3/uL (ref 0.00–0.07)
Basophils Absolute: 0 10*3/uL (ref 0.0–0.1)
Basophils Relative: 0 %
Eosinophils Absolute: 0.2 10*3/uL (ref 0.0–0.5)
Eosinophils Relative: 2 %
HCT: 33.2 % — ABNORMAL LOW (ref 36.0–46.0)
Hemoglobin: 11 g/dL — ABNORMAL LOW (ref 12.0–15.0)
Immature Granulocytes: 0 %
Lymphocytes Relative: 32 %
Lymphs Abs: 2.2 10*3/uL (ref 0.7–4.0)
MCH: 28.7 pg (ref 26.0–34.0)
MCHC: 33.1 g/dL (ref 30.0–36.0)
MCV: 86.7 fL (ref 80.0–100.0)
Monocytes Absolute: 0.5 10*3/uL (ref 0.1–1.0)
Monocytes Relative: 8 %
Neutro Abs: 3.9 10*3/uL (ref 1.7–7.7)
Neutrophils Relative %: 58 %
Platelets: 324 10*3/uL (ref 150–400)
RBC: 3.83 MIL/uL — ABNORMAL LOW (ref 3.87–5.11)
RDW: 13.7 % (ref 11.5–15.5)
WBC: 6.8 10*3/uL (ref 4.0–10.5)
nRBC: 0 % (ref 0.0–0.2)

## 2021-05-21 LAB — BASIC METABOLIC PANEL
Anion gap: 8 (ref 5–15)
BUN: 7 mg/dL (ref 6–20)
CO2: 25 mmol/L (ref 22–32)
Calcium: 9 mg/dL (ref 8.9–10.3)
Chloride: 110 mmol/L (ref 98–111)
Creatinine, Ser: 0.68 mg/dL (ref 0.44–1.00)
GFR, Estimated: >60 mL/min (ref >60)
Glucose, Bld: 80 mg/dL (ref 70–99)
Potassium: 3.1 mmol/L — ABNORMAL LOW (ref 3.5–5.1)
Sodium: 143 mmol/L (ref 135–145)

## 2021-05-21 LAB — TROPONIN I (HIGH SENSITIVITY): Troponin I (High Sensitivity): 5 ng/L (ref <18)

## 2021-05-21 NOTE — ED Provider Notes (Signed)
Emergency Medicine Provider Triage Evaluation Note  Melissa Pham , a 55 y.o. female  was evaluated in triage.  Pt complains of intermittent left-sided chest pain that has been ongoing for some time now. Chest pain radiates to left axilla and neck associated with left arm numbness. Patient was instructed to report to the ED next time chest pain occurred which is what caused her to report to the ED tonight. No relation to exertion. No pleuritic nature. She admits to increased physical activity.   Review of Systems  Positive: CP Negative:   Physical Exam  BP 135/69 (BP Location: Right Arm)   Pulse 83   Temp 98.4 F (36.9 C) (Oral)   Resp (!) 22   Ht 5\' 2"  (1.575 m)   Wt 110 kg   LMP 07/21/2013   SpO2 99%   BMI 44.35 kg/m  Gen:   Awake, no distress   Resp:  Normal effort  MSK:   Moves extremities without difficulty  Other:    Medical Decision Making  Medically screening exam initiated at 9:19 PM.  Appropriate orders placed.  Melissa Pham was informed that the remainder of the evaluation will be completed by another provider, this initial triage assessment does not replace that evaluation, and the importance of remaining in the ED until their evaluation is complete.  Cardiac labs   Karie Kirks 05/21/21 2131    Pattricia Boss, MD 05/22/21 [phone]

## 2021-05-21 NOTE — ED Triage Notes (Signed)
Patient reports left chest pain radiating to left arm/axilla and left neck , mild SOB , no emesis or diaphoresis .

## 2021-05-22 DIAGNOSIS — R079 Chest pain, unspecified: Secondary | ICD-10-CM | POA: Diagnosis not present

## 2021-05-22 LAB — TROPONIN I (HIGH SENSITIVITY): Troponin I (High Sensitivity): 4 ng/L (ref <18)

## 2021-05-22 NOTE — ED Notes (Signed)
PT left .  Stated they are going to come back in the am

## 2021-05-23 ENCOUNTER — Emergency Department (HOSPITAL_COMMUNITY): Payer: Medicare Other

## 2021-05-23 ENCOUNTER — Emergency Department (HOSPITAL_COMMUNITY)
Admission: EM | Admit: 2021-05-23 | Discharge: 2021-05-24 | Disposition: A | Discharge status: Home / Self Care | Payer: Medicare Other | Attending: Emergency Medicine | Admitting: Emergency Medicine

## 2021-05-23 DIAGNOSIS — E119 Type 2 diabetes mellitus without complications: Secondary | ICD-10-CM | POA: Insufficient documentation

## 2021-05-23 DIAGNOSIS — J45909 Unspecified asthma, uncomplicated: Secondary | ICD-10-CM | POA: Insufficient documentation

## 2021-05-23 DIAGNOSIS — R079 Chest pain, unspecified: Secondary | ICD-10-CM | POA: Insufficient documentation

## 2021-05-23 DIAGNOSIS — R0789 Other chest pain: Secondary | ICD-10-CM | POA: Diagnosis not present

## 2021-05-23 DIAGNOSIS — Z7982 Long term (current) use of aspirin: Secondary | ICD-10-CM | POA: Insufficient documentation

## 2021-05-23 DIAGNOSIS — I2510 Atherosclerotic heart disease of native coronary artery without angina pectoris: Secondary | ICD-10-CM | POA: Diagnosis not present

## 2021-05-23 DIAGNOSIS — Z79899 Other long term (current) drug therapy: Secondary | ICD-10-CM | POA: Diagnosis not present

## 2021-05-23 DIAGNOSIS — I10 Essential (primary) hypertension: Secondary | ICD-10-CM | POA: Diagnosis not present

## 2021-05-23 DIAGNOSIS — F17210 Nicotine dependence, cigarettes, uncomplicated: Secondary | ICD-10-CM | POA: Insufficient documentation

## 2021-05-23 DIAGNOSIS — N9489 Other specified conditions associated with female genital organs and menstrual cycle: Secondary | ICD-10-CM | POA: Diagnosis not present

## 2021-05-23 LAB — CBC
HCT: 32.9 % — ABNORMAL LOW (ref 36.0–46.0)
Hemoglobin: 10.9 g/dL — ABNORMAL LOW (ref 12.0–15.0)
MCH: 28.6 pg (ref 26.0–34.0)
MCHC: 33.1 g/dL (ref 30.0–36.0)
MCV: 86.4 fL (ref 80.0–100.0)
Platelets: 332 10*3/uL (ref 150–400)
RBC: 3.81 MIL/uL — ABNORMAL LOW (ref 3.87–5.11)
RDW: 13.7 % (ref 11.5–15.5)
WBC: 5.7 10*3/uL (ref 4.0–10.5)
nRBC: 0 % (ref 0.0–0.2)

## 2021-05-23 LAB — BASIC METABOLIC PANEL
Anion gap: 6 (ref 5–15)
BUN: 8 mg/dL (ref 6–20)
CO2: 28 mmol/L (ref 22–32)
Calcium: 9.3 mg/dL (ref 8.9–10.3)
Chloride: 108 mmol/L (ref 98–111)
Creatinine, Ser: 0.63 mg/dL (ref 0.44–1.00)
GFR, Estimated: >60 mL/min (ref >60)
Glucose, Bld: 91 mg/dL (ref 70–99)
Potassium: 3.2 mmol/L — ABNORMAL LOW (ref 3.5–5.1)
Sodium: 142 mmol/L (ref 135–145)

## 2021-05-23 LAB — TROPONIN I (HIGH SENSITIVITY)
Troponin I (High Sensitivity): 3 ng/L (ref <18)
Troponin I (High Sensitivity): 4 ng/L (ref <18)

## 2021-05-23 LAB — I-STAT BETA HCG BLOOD, ED (MC, WL, AP ONLY): I-stat hCG, quantitative: <5 m[IU]/mL (ref <5)

## 2021-05-23 NOTE — ED Triage Notes (Signed)
Pt here POV with c/o of chest pain that a sharp pain that shoots up right arm. Started Sunday. Was here on Monday and eloped d/t long wait time.  History of diabetes Pt states her left arm goes numb and she has spasms in her neck .

## 2021-05-23 NOTE — ED Provider Notes (Signed)
Emergency Medicine Provider Triage Evaluation Note  Melissa Pham , a 55 y.o. female  was evaluated in triage.  Pt complains of sharp chest pain over the last 4 days sometimes radiating down the left arm.  Review of Systems  Positive: Chest pain, occasional SOB Negative: N/V/D, abd pain, fever, syncope  Physical Exam  BP (!) 164/94 (BP Location: Right Arm)   Pulse 88   Temp 98.6 F (37 C) (Oral)   Resp 16   Ht 5\' 2"  (1.575 m)   Wt 110 kg   LMP 07/21/2013   SpO2 100%   BMI 44.35 kg/m   Gen:   Awake, no distress   Resp:  Normal effort  MSK:   Moves extremities without difficulty  Other:    Medical Decision Making  Medically screening exam initiated at 4:11 PM.  Appropriate orders placed.  Melissa Pham was informed that the remainder of the evaluation will be completed by another provider, this initial triage assessment does not replace that evaluation, and the importance of remaining in the ED until their evaluation is complete.     Lorayne Bender, PA-C 05/23/21 1620    Lajean Saver, MD 05/28/21 [phone]

## 2021-05-24 NOTE — ED Provider Notes (Signed)
[facility] EMERGENCY DEPARTMENT Provider Note   CSN: [id number] Arrival date & time: 05/23/21  1520     History Chief Complaint  Patient presents with   Chest Pain    Melissa Pham is a 55 y.o. female.  55 y/o female with hx of anxiety, asthma, DM, HLD, HTN, schizophrenia presents to the ED for c/o chest pain.  Patient reports that she has been experiencing chest pain intermittently since Sunday.  She describes a "gripping" pain in her left upper chest which radiates to her left arm and down into her hand.  Has sensation of left hand paresthesias.  Denies any known alleviating factors of her symptoms, though she does state that pressing on her chest feels "sore".  She has just started doing Zumba and water aerobics.  She has not taken any medications for her pain.  No nausea, vomiting, diarrhea, fever, syncope, hemoptysis.  Is symptom-free at this time.  The history is provided by the patient. No language interpreter was used.  Chest Pain     Past Medical History:  Diagnosis Date   ADHD (attention deficit hyperactivity disorder)    Anxiety    Asthma    Bipolar disorder ([place])    Blood transfusion without reported diagnosis 1985   after childbirth   Depression    Diabetes mellitus without complication ([place])    GERD (gastroesophageal reflux disease)    Hyperlipidemia    Hypertension    Schizophrenia ([place])    Sickle cell trait ([place])     Patient Active Problem List   Diagnosis Date Noted   Syncope 12/06/2018   CAD in native artery    Tobacco abuse 07/27/2018   Pure hypercholesterolemia    Generalized abdominal discomfort 12/10/2017   Effusion, right knee 03/13/2017   Sprain of right knee 03/13/2017   Chest pain 02/20/2015   Suprapubic pain 02/20/2015   Diarrhea 02/20/2015   Diabetes mellitus, type 2 ([place]) 02/20/2015   HTN (hypertension) 02/20/2015   Dyslipidemia 02/20/2015   Bipolar affective disorder ([place]) 02/20/2015   Schizophrenia ([place])  02/20/2015   Hypokalemia 02/20/2015    Past Surgical History:  Procedure Laterality Date   CERVICAL BIOPSY  W/ LOOP ELECTRODE EXCISION     left eye surgery  [place]     OB History     Gravida  7   Para      Term      Preterm      AB  3   Living  4      SAB  1   IAB  2   Ectopic      Multiple      Live Births              Family History  Problem Relation Age of Onset   Hyperlipidemia Mother    Arthritis Mother    Parkinson's disease Mother    Heart attack Father    Diabetes Father    Hypertension Father    Cancer Sister        cervical   Breast cancer Sister 27   Breast cancer Sister    Colon cancer Maternal Grandmother 97   Breast cancer Paternal Aunt    Esophageal cancer Neg Hx    Rectal cancer Neg Hx     Social History   Tobacco Use   Smoking status: Every Day    Packs/day: 0.25    Years: 4.00    Pack years: 1.00  Types: Cigarettes   Smokeless tobacco: Never  Vaping Use   Vaping Use: Never used  Substance Use Topics   Alcohol use: No    Alcohol/week: 0.0 standard drinks   Drug use: No    Comment: no drug use for 7 years    Home Medications Prior to Admission medications   Medication Sig Start Date End Date Taking? Authorizing Provider  acetaminophen (TYLENOL) 500 MG tablet Take 1,000 mg by mouth every 6 (six) hours as needed for mild pain.    [provider]  albuterol (PROVENTIL HFA;VENTOLIN HFA) 108 (90 BASE) MCG/ACT inhaler Inhale 2 puffs into the lungs every 6 (six) hours as needed for wheezing or shortness of breath.     [provider]  albuterol (PROVENTIL) (2.5 MG/3ML) 0.083% nebulizer solution Take 2.5 mg by nebulization every 6 (six) hours as needed for wheezing or shortness of breath.    [provider]  aspirin EC 81 MG tablet Take 81 mg by mouth daily.    [provider]  atorvastatin (LIPITOR) 40 MG tablet Take 1 tablet (40 mg total) by mouth daily. Patient  not taking: Reported on 02/23/2020 12/13/17 12/06/18  Alma Friendly, MD  clotrimazole (MYCELEX) 10 MG troche Take 1 tablet (10 mg total) by mouth 5 (five) times daily. 04/18/21   Katy Apo, NP  diclofenac Sodium (VOLTAREN) 1 % GEL Apply 4 g topically 4 (four) times daily. 02/23/20   Joy, Shawn C, PA-C  gabapentin (NEURONTIN) 600 MG tablet Take 600 mg by mouth 3 (three) times daily.     [provider]  hydrocortisone (ANUSOL-HC) 25 MG suppository Place 1 suppository (25 mg total) rectally 2 (two) times daily. 02/23/20   Joy, Shawn C, PA-C  ibuprofen (ADVIL) 400 MG tablet Take 1 tablet (400 mg total) by mouth every 6 (six) hours as needed for moderate pain. 05/02/21   Jaynee Eagles, PA-C  lidocaine (XYLOCAINE) 2 % solution Use as directed 10 mLs in the mouth or throat every 3 (three) hours as needed for mouth pain (Swish and Spit out- do not swallow). 04/18/21   Katy Apo, NP  Lidocaine-Glycerin (PREPARATION H RE) Place 1 application rectally as needed (hemmroids).    [provider]  LISINOPRIL PO Take by mouth.    [provider]  metoprolol succinate (TOPROL-XL) 50 MG 24 hr tablet Take 50 mg by mouth daily. Take with or immediately following a meal.    [provider]  mometasone (NASONEX) 50 MCG/ACT nasal spray Place 2 sprays into the nose 2 (two) times daily as needed (allergies).     [provider]  Multiple Vitamins-Minerals (MULTIVITAMIN WITH MINERALS) tablet Take 1 tablet by mouth daily.    [provider]  nitroGLYCERIN (NITROSTAT) 0.4 MG SL tablet Place 0.4 mg under the tongue every 5 (five) minutes as needed for chest pain.    [provider]  omeprazole (PRILOSEC) 20 MG capsule Take 2 capsules (40 mg total) by mouth daily. 02/21/15   Hosie Poisson, MD  PARoxetine (PAXIL) 20 MG tablet Take 20 mg by mouth daily as needed (hotflashes).     [provider]  valACYclovir (VALTREX) 1000 MG tablet Take 1,000 mg by  mouth daily.    [provider]    Allergies    Sulfa antibiotics  Review of Systems   Review of Systems  Cardiovascular:  Positive for chest pain.  Ten systems reviewed and are negative for acute change, except as noted in  the HPI.    Physical Exam Updated Vital Signs BP 101/88 (BP Location: Right Arm)   Pulse 78   Temp 98.5 F (36.9 C) (Oral)   Resp (!) 22   Ht 5\' 2"  (1.575 m)   Wt 110 kg   LMP 07/21/2013   SpO2 97%   BMI 44.35 kg/m   Physical Exam Vitals and nursing note reviewed.  Constitutional:      General: She is not in acute distress.    Appearance: She is well-developed. She is not diaphoretic.     Comments: Obese, nontoxic AA female  HENT:     Head: Normocephalic and atraumatic.  Eyes:     General: No scleral icterus.    Conjunctiva/sclera: Conjunctivae normal.  Neck:     Comments: No carotid bruits b/l Cardiovascular:     Rate and Rhythm: Normal rate and regular rhythm.     Pulses: Normal pulses.  Pulmonary:     Effort: Pulmonary effort is normal. No respiratory distress.     Comments: Respirations even and unlabored. Lungs CTAB. Musculoskeletal:        General: Normal range of motion.     Cervical back: Normal range of motion.  Skin:    General: Skin is warm and dry.     Coloration: Skin is not pale.     Findings: No erythema or rash.  Neurological:     Mental Status: She is alert and oriented to person, place, and time.     Coordination: Coordination normal.  Psychiatric:        Behavior: Behavior normal.    ED Results / Procedures / Treatments   Labs (all labs ordered are listed, but only abnormal results are displayed) Labs Reviewed  BASIC METABOLIC PANEL - Abnormal; Notable for the following components:      Result Value   Potassium 3.2 (*)    All other components within normal limits  CBC - Abnormal; Notable for the following components:   RBC 3.81 (*)    Hemoglobin 10.9 (*)    HCT 32.9 (*)    All other components  within normal limits  I-STAT BETA HCG BLOOD, ED ([facility], [facility], [facility] ONLY)  TROPONIN I (HIGH SENSITIVITY)  TROPONIN I (HIGH SENSITIVITY)    EKG EKG Interpretation  Date/Time:  Wednesday May 23 2021 16:12:05 EDT Ventricular Rate:  72 PR Interval:  148 QRS Duration: 66 QT Interval:  400 QTC Calculation: 438 R Axis:   71 Text Interpretation: Normal sinus rhythm Normal ECG No acute changes Confirmed by Addison Lank [phone]) on 05/24/2021 12:01:29 AM  Radiology DG Chest 2 View  Result Date: 05/23/2021 CLINICAL DATA:  Sharp chest pain radiating to right arm EXAM: CHEST - 2 VIEW COMPARISON:  05/21/2021 FINDINGS: Frontal and lateral views of the chest demonstrate an unremarkable cardiac silhouette. No airspace disease, effusion, or pneumothorax. No acute bony abnormalities. IMPRESSION: 1. No acute intrathoracic process. Electronically Signed   By: Randa Ngo M.D.   On: 05/23/2021 20:32    Procedures Procedures   Medications Ordered in ED Medications - No data to display  ED Course  I have reviewed the triage vital signs and the nursing notes.  Pertinent labs & imaging results that were available during my care of the patient were reviewed by me and considered in my medical decision making (see chart for details).  Clinical Course as of 05/24/21 0410  Thu May 24, 2021  [id number] Prior nuclear stress from 12/2018:  Nuclear stress EF: 68%.  There was no ST segment deviation noted during stress.  Defect 1: There is a medium defect of mild severity present in the basal anterior, mid anterior and apical anterior location, which is fixed.  This is a low risk study.  The left ventricular ejection fraction is hyperdynamic (>65%). [KH]    Clinical Course User Index [KH] Antonietta Breach, PA-C   MDM Rules/Calculators/A&P                           Patient presents to the emergency department for evaluation of chest pain.  Low suspicion for emergent cardiac etiology given reassuring workup today.   EKG is nonischemic and troponin negative x 2.  Chest x-ray without evidence of mediastinal widening to suggest dissection.  No pneumothorax, pneumonia, pleural effusion.  Pulmonary embolus further considered; however, patient without tachycardia, tachypnea, dyspnea, hypoxia.  Well's PE score 0.  She has degree of reproducibility of her pain on exam.  Just recently started water aerobics and Zumba.  Question musculoskeletal etiology.  However, given age, risk factors, will refer to cardiology for outpatient follow-up.  She had a low risk nuclear stress test 2 years ago.  Return precautions discussed and provided. Patient discharged in stable condition with no unaddressed concerns.   Final Clinical Impression(s) / ED Diagnoses Final diagnoses:  Nonspecific chest pain    Rx / DC Orders ED Discharge Orders     None        Antonietta Breach, PA-C 05/24/21 0432    Maudie Flakes, MD 05/24/21 [phone]

## 2021-05-24 NOTE — Discharge Instructions (Addendum)
Your evaluation in the emergency department today was reassuring.  We recommend follow-up with cardiology regarding your ongoing, recurrent chest discomfort.  It is believed this may be related to muscle inflammation or spasm related to your increased physical activity.  Return to the ED for new or concerning symptoms.

## 2021-05-24 NOTE — ED Notes (Signed)
Pt complaining consistantly about vitals being repeated and being here that long. Refused vitals check

## 2021-07-22 DIAGNOSIS — R911 Solitary pulmonary nodule: Secondary | ICD-10-CM | POA: Diagnosis not present

## 2021-07-22 DIAGNOSIS — R059 Cough, unspecified: Secondary | ICD-10-CM | POA: Diagnosis not present

## 2021-07-22 DIAGNOSIS — Z720 Tobacco use: Secondary | ICD-10-CM | POA: Diagnosis not present

## 2021-07-26 DIAGNOSIS — R918 Other nonspecific abnormal finding of lung field: Secondary | ICD-10-CM | POA: Diagnosis not present

## 2021-07-26 DIAGNOSIS — R0781 Pleurodynia: Secondary | ICD-10-CM | POA: Diagnosis not present

## 2021-07-26 DIAGNOSIS — R059 Cough, unspecified: Secondary | ICD-10-CM | POA: Diagnosis not present

## 2021-07-26 DIAGNOSIS — R0602 Shortness of breath: Secondary | ICD-10-CM | POA: Diagnosis not present

## 2021-09-13 ENCOUNTER — Ambulatory Visit: Payer: Medicare Other | Admitting: Internal Medicine

## 2021-10-03 DIAGNOSIS — I10 Essential (primary) hypertension: Secondary | ICD-10-CM | POA: Diagnosis not present

## 2021-10-03 DIAGNOSIS — Z0001 Encounter for general adult medical examination with abnormal findings: Secondary | ICD-10-CM | POA: Diagnosis not present

## 2021-10-03 DIAGNOSIS — Z789 Other specified health status: Secondary | ICD-10-CM | POA: Diagnosis not present

## 2021-10-03 DIAGNOSIS — E1165 Type 2 diabetes mellitus with hyperglycemia: Secondary | ICD-10-CM | POA: Diagnosis not present

## 2021-10-03 DIAGNOSIS — K219 Gastro-esophageal reflux disease without esophagitis: Secondary | ICD-10-CM | POA: Diagnosis not present

## 2021-10-03 DIAGNOSIS — J4530 Mild persistent asthma, uncomplicated: Secondary | ICD-10-CM | POA: Diagnosis not present

## 2021-10-03 DIAGNOSIS — E782 Mixed hyperlipidemia: Secondary | ICD-10-CM | POA: Diagnosis not present

## 2021-10-03 DIAGNOSIS — M1990 Unspecified osteoarthritis, unspecified site: Secondary | ICD-10-CM | POA: Diagnosis not present

## 2021-10-04 DIAGNOSIS — E1165 Type 2 diabetes mellitus with hyperglycemia: Secondary | ICD-10-CM | POA: Diagnosis not present

## 2021-10-04 DIAGNOSIS — J4530 Mild persistent asthma, uncomplicated: Secondary | ICD-10-CM | POA: Diagnosis not present

## 2021-10-04 DIAGNOSIS — M1990 Unspecified osteoarthritis, unspecified site: Secondary | ICD-10-CM | POA: Diagnosis not present

## 2021-10-04 DIAGNOSIS — E782 Mixed hyperlipidemia: Secondary | ICD-10-CM | POA: Diagnosis not present

## 2021-10-04 DIAGNOSIS — I10 Essential (primary) hypertension: Secondary | ICD-10-CM | POA: Diagnosis not present

## 2021-10-05 DIAGNOSIS — I700 Atherosclerosis of aorta: Secondary | ICD-10-CM | POA: Diagnosis not present

## 2021-10-05 DIAGNOSIS — J439 Emphysema, unspecified: Secondary | ICD-10-CM | POA: Diagnosis not present

## 2021-10-05 DIAGNOSIS — R911 Solitary pulmonary nodule: Secondary | ICD-10-CM | POA: Diagnosis not present

## 2022-02-04 ENCOUNTER — Other Ambulatory Visit: Payer: Self-pay | Admitting: Internal Medicine

## 2022-02-04 DIAGNOSIS — Z1231 Encounter for screening mammogram for malignant neoplasm of breast: Secondary | ICD-10-CM

## 2022-03-01 ENCOUNTER — Ambulatory Visit (HOSPITAL_COMMUNITY)
Admission: EM | Admit: 2022-03-01 | Discharge: 2022-03-01 | Disposition: A | Discharge status: Home / Self Care | Payer: Medicare Other | Attending: Internal Medicine | Admitting: Internal Medicine

## 2022-03-01 ENCOUNTER — Ambulatory Visit (INDEPENDENT_AMBULATORY_CARE_PROVIDER_SITE_OTHER): Payer: Medicare Other

## 2022-03-01 ENCOUNTER — Encounter (HOSPITAL_COMMUNITY): Payer: Self-pay | Admitting: *Deleted

## 2022-03-01 ENCOUNTER — Other Ambulatory Visit: Payer: Self-pay

## 2022-03-01 DIAGNOSIS — R0602 Shortness of breath: Secondary | ICD-10-CM

## 2022-03-01 DIAGNOSIS — J4541 Moderate persistent asthma with (acute) exacerbation: Secondary | ICD-10-CM

## 2022-03-01 DIAGNOSIS — K2100 Gastro-esophageal reflux disease with esophagitis, without bleeding: Secondary | ICD-10-CM | POA: Diagnosis not present

## 2022-03-01 MED ORDER — BUDESONIDE-FORMOTEROL FUMARATE 80-4.5 MCG/ACT IN AERO: 2.0000 | INHALATION_SPRAY | Freq: Once | RESPIRATORY_TRACT | 12 refills | Status: DC

## 2022-03-01 MED ORDER — ALBUTEROL SULFATE (2.5 MG/3ML) 0.083% IN NEBU: 2.5000 mg | INHALATION_SOLUTION | Freq: Four times a day (QID) | RESPIRATORY_TRACT | 12 refills | Status: DC | PRN

## 2022-03-01 MED ORDER — VALACYCLOVIR HCL 1 G PO TABS: 1000.0000 mg | ORAL_TABLET | Freq: Three times a day (TID) | ORAL | 0 refills | Status: AC

## 2022-03-01 MED ORDER — PANTOPRAZOLE SODIUM 40 MG PO TBEC: 40.0000 mg | DELAYED_RELEASE_TABLET | Freq: Every day | ORAL | 1 refills | Status: DC

## 2022-03-01 MED ORDER — GABAPENTIN 300 MG PO CAPS: 600.0000 mg | ORAL_CAPSULE | Freq: Three times a day (TID) | ORAL | 0 refills | Status: DC

## 2022-03-01 MED ORDER — ALBUTEROL SULFATE HFA 108 (90 BASE) MCG/ACT IN AERS: 1.0000 | INHALATION_SPRAY | Freq: Four times a day (QID) | RESPIRATORY_TRACT | 1 refills | Status: DC | PRN

## 2022-03-01 MED ORDER — SUCRALFATE 1 G PO TABS: 1.0000 g | ORAL_TABLET | Freq: Two times a day (BID) | ORAL | 0 refills | Status: DC

## 2022-03-01 NOTE — ED Triage Notes (Addendum)
PT reports waking up yesterday with [facility] . Pt has used her Neb treatment  last night and this morning. Pt reports SHOB did improved with Neb treatments. Pt reports for one week she has been feeling bad. Pt reports it feels like her lungs. Pt reports CP when breathing . Pt also has Lt arm numbness . ?

## 2022-03-01 NOTE — ED Provider Notes (Signed)
?[facility] ? ? ? ?CSN: [id number] ?Arrival date & time: 03/01/22  1004 ? ? ?  ? ?History   ?Chief Complaint ?Chief Complaint  ?Patient presents with  ? lung problems  ? Cough  ? ? ?HPI ?Melissa Pham is a 56 y.o. female.  ? ?Patient presents urgent care for evaluation of her 1 week history of worsening shortness of breath and chest pain.  She has a significant medical history of asthma, CAD, diabetes myelitis, GERD and hypertension. Last night patient states she woke up and she "couldn't breathe" and her "chest hurt and feels like a burning sensation in her throat that goes down to her chest." Her grandchildren came into her room to help her last night. She states she was "sweating and she took her breathing treatment." After her breathing treatment last night, she felt better. She also took baking soda in water, Mylanta, and and Peptobismol this morning with minimal relief. She has been using her albuterol inhaler everyday of the week sometimes multiple times per day for the last few weeks. Reports hospitalization in the past for asthma, but cannot remember the last time she was hospitalized for this.  ? ?This morning, she was at social services when she "broke out into a cold sweat" and her chest pain became worse. She thinks her pain is from "her lungs" because at an urgent care visit 3-5 months ago, they found a "spot on her lungs." States these episodes have happened 3 times in the last 3-4 months. Cannot identify triggering or relieving factors and does not know how long they last until they get better. Reports increased fatigue and states "she hasn't been able to get out of the bed" for the last week. States she can see her "neck veins pulsating", she breaks out into a cold sweat, then she develops chest pain that she rates at a 10 on a scale of 0-10. Also reports increased leg swelling noticed by her home health nurse that improves when she elevates her legs. She is unable to sleep laying flat  at night and she "has a hospital bed" that she uses to sit herself up. Denies cough.  ? ?Patient reports she ate a "big mac with extra sauce and a small fry" last night for dinner. She takes omeprazole daily for GERD and states consistent use of this medication. Patient reports eating a lot of fried foods and does not cook at home a lot. Also reports eating pizza delivery 2-3 times per week. Denies coffee.  Denies nausea, vomiting, trouble swallowing, and fever.  She does smoke tobacco, but reports "cutting back" lately. She has been going to the [facility] over the last 5 months and has participated in swimming classes in an attempt to lose weight. She reports significant weight loss in the last 5 months due to this and hopes to continue on her  fitness journey.  ? ? ?Cough ? ?Past Medical History:  ?Diagnosis Date  ? ADHD (attention deficit hyperactivity disorder)   ? Anxiety   ? Asthma   ? Bipolar disorder ([place])   ? Blood transfusion without reported diagnosis 1985  ? after childbirth  ? Depression   ? Diabetes mellitus without complication ([place])   ? GERD (gastroesophageal reflux disease)   ? Hyperlipidemia   ? Hypertension   ? Schizophrenia ([place])   ? Sickle cell trait ([place])   ? ? ?Patient Active Problem List  ? Diagnosis Date Noted  ? Syncope 12/06/2018  ? CAD in native  artery   ? Tobacco abuse 07/27/2018  ? Pure hypercholesterolemia   ? Generalized abdominal discomfort 12/10/2017  ? Effusion, right knee 03/13/2017  ? Sprain of right knee 03/13/2017  ? Chest pain 02/20/2015  ? Suprapubic pain 02/20/2015  ? Diarrhea 02/20/2015  ? Diabetes mellitus, type 2 ([place]) 02/20/2015  ? HTN (hypertension) 02/20/2015  ? Dyslipidemia 02/20/2015  ? Bipolar affective disorder ([place]) 02/20/2015  ? Schizophrenia ([place]) 02/20/2015  ? Hypokalemia 02/20/2015  ? ? ?Past Surgical History:  ?Procedure Laterality Date  ? CERVICAL BIOPSY  W/ LOOP ELECTRODE EXCISION    ? left eye surgery  1970  ? TUBAL LIGATION  1989  ? ? ?OB History   ? ? Gravida   ?7  ? Para  ?   ? Term  ?   ? Preterm  ?   ? AB  ?3  ? Living  ?4  ?  ? ? SAB  ?1  ? IAB  ?2  ? Ectopic  ?   ? Multiple  ?   ? Live Births  ?   ?   ?  ?  ? ? ? ?Home Medications   ? ?Prior to Admission medications   ?Medication Sig Start Date End Date Taking? Authorizing Provider  ?albuterol (PROVENTIL) (2.5 MG/3ML) 0.083% nebulizer solution Take 3 mLs (2.5 mg total) by nebulization every 6 (six) hours as needed for wheezing or shortness of breath. 03/01/22  Yes Talbot Grumbling, FNP  ?albuterol (VENTOLIN HFA) 108 (90 Base) MCG/ACT inhaler Inhale 1-2 puffs into the lungs every 6 (six) hours as needed for wheezing or shortness of breath. 03/01/22  Yes Talbot Grumbling, FNP  ?budesonide-formoterol (SYMBICORT) 80-4.5 MCG/ACT inhaler Inhale 2 puffs into the lungs once for 1 dose. 03/01/22 03/01/22 Yes Talbot Grumbling, FNP  ?gabapentin (NEURONTIN) 300 MG capsule Take 2 capsules (600 mg total) by mouth 3 (three) times daily. 03/01/22  Yes Talbot Grumbling, FNP  ?pantoprazole (PROTONIX) 40 MG tablet Take 1 tablet (40 mg total) by mouth daily. 03/01/22  Yes Talbot Grumbling, FNP  ?sucralfate (CARAFATE) 1 g tablet Take 1 tablet (1 g total) by mouth 2 (two) times daily. 03/01/22  Yes Talbot Grumbling, FNP  ?valACYclovir (VALTREX) 1000 MG tablet Take 1 tablet (1,000 mg total) by mouth 3 (three) times daily for 14 days. 03/01/22 03/15/22 Yes Talbot Grumbling, FNP  ?acetaminophen (TYLENOL) 500 MG tablet Take 1,000 mg by mouth every 6 (six) hours as needed for mild pain.    [provider]  ?aspirin EC 81 MG tablet Take 81 mg by mouth daily.    [provider]  ?atorvastatin (LIPITOR) 40 MG tablet Take 1 tablet (40 mg total) by mouth daily. ?Patient not taking: Reported on 02/23/2020 12/13/17 12/06/18  Alma Friendly, MD  ?clotrimazole (MYCELEX) 10 MG troche Take 1 tablet (10 mg total) by mouth 5 (five) times daily. 04/18/21   Katy Apo, NP  ?diclofenac Sodium (VOLTAREN) 1 %  GEL Apply 4 g topically 4 (four) times daily. 02/23/20   Joy, Shawn C, PA-C  ?hydrocortisone (ANUSOL-HC) 25 MG suppository Place 1 suppository (25 mg total) rectally 2 (two) times daily. 02/23/20   Joy, Shawn C, PA-C  ?ibuprofen (ADVIL) 400 MG tablet Take 1 tablet (400 mg total) by mouth every 6 (six) hours as needed for moderate pain. 05/02/21   Jaynee Eagles, PA-C  ?lidocaine (XYLOCAINE) 2 % solution Use as directed 10 mLs in the mouth or throat every 3 (three)  hours as needed for mouth pain (Swish and Spit out- do not swallow). 04/18/21   Katy Apo, NP  ?Lidocaine-Glycerin (PREPARATION H RE) Place 1 application rectally as needed (hemmroids).    [provider]  ?LISINOPRIL PO Take by mouth.    [provider]  ?metoprolol succinate (TOPROL-XL) 50 MG 24 hr tablet Take 50 mg by mouth daily. Take with or immediately following a meal.    [provider]  ?mometasone (NASONEX) 50 MCG/ACT nasal spray Place 2 sprays into the nose 2 (two) times daily as needed (allergies).     [provider]  ?Multiple Vitamins-Minerals (MULTIVITAMIN WITH MINERALS) tablet Take 1 tablet by mouth daily.    [provider]  ?nitroGLYCERIN (NITROSTAT) 0.4 MG SL tablet Place 0.4 mg under the tongue every 5 (five) minutes as needed for chest pain.    [provider]  ?PARoxetine (PAXIL) 20 MG tablet Take 20 mg by mouth daily as needed (hotflashes).     [provider]  ? ? ?Family History ?Family History  ?Problem Relation Age of Onset  ? Hyperlipidemia Mother   ? Arthritis Mother   ? Parkinson's disease Mother   ? Heart attack Father   ? Diabetes Father   ? Hypertension Father   ? Cancer Sister   ?     cervical  ? Breast cancer Sister 87  ? Breast cancer Sister   ? Colon cancer Maternal Grandmother 48  ? Breast cancer Paternal Aunt   ? Esophageal cancer Neg Hx   ? Rectal cancer Neg Hx   ? ? ?Social History ?Social History  ? ?Tobacco Use  ? Smoking status: Every Day  ?   Packs/day: 0.25  ?  Years: 4.00  ?  Pack years: 1.00  ?  Types: Cigarettes  ? Smokeless tobacco: Never  ?Vaping Use  ? Vaping Use: Never used  ?Substance Use Topics  ? Alcohol use: No  ?  Alcohol/week: 0.0 sta

## 2022-03-01 NOTE — Discharge Instructions (Addendum)
You were seen in urgent care today for an asthma exacerbation. Your chest x-ray was normal. I believe that this is the cause of your shortness of breath and fatigue.  I have added another inhaler to your regimen called Symbicort.  I would like you to take this inhaler every single day to help control your asthma.  It is a longer acting inhaler that has a steroid in it as well to help decrease inflammation in your lungs and to help your breathing.  Use your albuterol inhaler/nebulizer as needed.  If you find that you are needing your albuterol inhaler more frequently than usual and you are continuing to take your Symbicort inhaler, please seek medical care with your primary care provider as your symptoms are likely not controlled with your current asthma regimen. ? ?Please continue to try to decrease your smoking as this will help with your asthma. ? ?I have also changed your GERD medication to Protonix.  Please take this once a day in the morning 30 minutes before food.  I have also added on Carafate.  Crush this medication and mix it up in a small amount of water to create a liquid and drink this liquid with the medication in it.  ? ?Please go to the emergency room if you experience any sudden new or worsening symptoms.  ?

## 2022-03-27 ENCOUNTER — Ambulatory Visit (HOSPITAL_COMMUNITY)
Admission: EM | Admit: 2022-03-27 | Discharge: 2022-03-27 | Disposition: A | Discharge status: Home / Self Care | Payer: Medicare Other | Attending: Family Medicine | Admitting: Family Medicine

## 2022-03-27 DIAGNOSIS — E119 Type 2 diabetes mellitus without complications: Secondary | ICD-10-CM

## 2022-03-27 DIAGNOSIS — E785 Hyperlipidemia, unspecified: Secondary | ICD-10-CM

## 2022-03-27 DIAGNOSIS — R600 Localized edema: Secondary | ICD-10-CM | POA: Diagnosis not present

## 2022-03-27 DIAGNOSIS — E118 Type 2 diabetes mellitus with unspecified complications: Secondary | ICD-10-CM

## 2022-03-27 DIAGNOSIS — Z760 Encounter for issue of repeat prescription: Secondary | ICD-10-CM | POA: Diagnosis not present

## 2022-03-27 MED ORDER — GABAPENTIN 300 MG PO CAPS: 300.0000 mg | ORAL_CAPSULE | Freq: Every day | ORAL | 0 refills | Status: DC

## 2022-03-27 MED ORDER — SUCRALFATE 1 G PO TABS: 1.0000 g | ORAL_TABLET | Freq: Two times a day (BID) | ORAL | 0 refills | Status: DC

## 2022-03-27 MED ORDER — SIMVASTATIN 40 MG PO TABS: 40.0000 mg | ORAL_TABLET | Freq: Every day | ORAL | 0 refills | Status: DC

## 2022-03-27 NOTE — ED Triage Notes (Signed)
Pt needing refills of medications until can be seen at new PCP appt 6/6. Simvastatin '40mg'$  Sucralfate 1gm  Gabapentin '300mg'$   valacyclovir 1gm  Pt also having sores on tongue and swelling in neck that would like looked at today. Using OTC numbing medications to help with pain.

## 2022-03-27 NOTE — Discharge Instructions (Signed)
I have provided you a refill of the medications that you requested.  As we discussed, you do not have to take valacyclovir unless you are having an outbreak.  If you continue to have sores on her mouth, or the sores in your mouth or not healing, please follow-up with your primary care doctor or a dentist.  Make sure you see your primary care doctor to get further refills of your medication.  If the swelling of your neck gets larger or becomes painful, red, you have difficulty swallowing, you should be seen by medical provider right away.

## 2022-03-27 NOTE — ED Provider Notes (Signed)
[place] [facility]    CSN: [id number] Arrival date & time: 03/27/22  0859      History   Chief Complaint Chief Complaint  Patient presents with   Medication Refill   Mouth Lesions    HPI Melissa Pham is a 56 y.o. female.   Medication refill Patient is requesting medication refills for simvastatin 40 mg, sucralfate 1 g, gabapentin 300 mg, valacyclovir 1 g She reports that she does not currently have any herpes outbreaks and has not had some in quite some time She has an appointment on 6/6 with her new primary care provider and needs medications to get her through to this point She also reports that she has swelling off-and-on in her neck for many years, not painful, but wants to know if it is anything concerning No difficulty swallowing She has noticed that she has a lesion on the right side of her tongue and she is wondering where this came from She states that it is not very painful right now, but was slightly sore when it first came up She has been using over-the-counter numbing agents to help with this   Past Medical History:  Diagnosis Date   ADHD (attention deficit hyperactivity disorder)    Anxiety    Asthma    Bipolar disorder ([place])    Blood transfusion without reported diagnosis 1985   after childbirth   Depression    Diabetes mellitus without complication ([place])    GERD (gastroesophageal reflux disease)    Hyperlipidemia    Hypertension    Schizophrenia ([place])    Sickle cell trait ([place])     Patient Active Problem List   Diagnosis Date Noted   Syncope 12/06/2018   CAD in native artery    Tobacco abuse 07/27/2018   Pure hypercholesterolemia    Generalized abdominal discomfort 12/10/2017   Effusion, right knee 03/13/2017   Sprain of right knee 03/13/2017   Chest pain 02/20/2015   Suprapubic pain 02/20/2015   Diarrhea 02/20/2015   Diabetes mellitus, type 2 ([place]) 02/20/2015   HTN (hypertension) 02/20/2015   Dyslipidemia 02/20/2015   Bipolar  affective disorder ([place]) 02/20/2015   Schizophrenia ([place]) 02/20/2015   Hypokalemia 02/20/2015    Past Surgical History:  Procedure Laterality Date   CERVICAL BIOPSY  W/ LOOP ELECTRODE EXCISION     left eye surgery  1970   TUBAL LIGATION  1989    OB History     Gravida  7   Para      Term      Preterm      AB  3   Living  4      SAB  1   IAB  2   Ectopic      Multiple      Live Births               Home Medications    Prior to Admission medications   Medication Sig Start Date End Date Taking? Authorizing Provider  simvastatin (ZOCOR) 40 MG tablet Take 1 tablet (40 mg total) by mouth daily. 03/27/22  Yes Junie Engram, Bernita Raisin, DO  acetaminophen (TYLENOL) 500 MG tablet Take 1,000 mg by mouth every 6 (six) hours as needed for mild pain.    [provider]  albuterol (PROVENTIL) (2.5 MG/3ML) 0.083% nebulizer solution Take 3 mLs (2.5 mg total) by nebulization every 6 (six) hours as needed for wheezing or shortness of breath. 03/01/22   Talbot Grumbling, FNP  albuterol (VENTOLIN HFA)  108 (90 Base) MCG/ACT inhaler Inhale 1-2 puffs into the lungs every 6 (six) hours as needed for wheezing or shortness of breath. 03/01/22   Talbot Grumbling, FNP  aspirin EC 81 MG tablet Take 81 mg by mouth daily.    [provider]  budesonide-formoterol (SYMBICORT) 80-4.5 MCG/ACT inhaler Inhale 2 puffs into the lungs once for 1 dose. 03/01/22 03/01/22  Talbot Grumbling, FNP  clotrimazole (MYCELEX) 10 MG troche Take 1 tablet (10 mg total) by mouth 5 (five) times daily. 04/18/21   Katy Apo, NP  diclofenac Sodium (VOLTAREN) 1 % GEL Apply 4 g topically 4 (four) times daily. 02/23/20   Joy, Shawn C, PA-C  gabapentin (NEURONTIN) 300 MG capsule Take 1 capsule (300 mg total) by mouth at bedtime. 03/27/22   Yoko Mcgahee, Bernita Raisin, DO  hydrocortisone (ANUSOL-HC) 25 MG suppository Place 1 suppository (25 mg total) rectally 2 (two) times daily. 02/23/20   Joy, Shawn  C, PA-C  ibuprofen (ADVIL) 400 MG tablet Take 1 tablet (400 mg total) by mouth every 6 (six) hours as needed for moderate pain. 05/02/21   Jaynee Eagles, PA-C  lidocaine (XYLOCAINE) 2 % solution Use as directed 10 mLs in the mouth or throat every 3 (three) hours as needed for mouth pain (Swish and Spit out- do not swallow). 04/18/21   Katy Apo, NP  Lidocaine-Glycerin (PREPARATION H RE) Place 1 application rectally as needed (hemmroids).    [provider]  LISINOPRIL PO Take by mouth.    [provider]  metoprolol succinate (TOPROL-XL) 50 MG 24 hr tablet Take 50 mg by mouth daily. Take with or immediately following a meal.    [provider]  mometasone (NASONEX) 50 MCG/ACT nasal spray Place 2 sprays into the nose 2 (two) times daily as needed (allergies).     [provider]  Multiple Vitamins-Minerals (MULTIVITAMIN WITH MINERALS) tablet Take 1 tablet by mouth daily.    [provider]  nitroGLYCERIN (NITROSTAT) 0.4 MG SL tablet Place 0.4 mg under the tongue every 5 (five) minutes as needed for chest pain.    [provider]  pantoprazole (PROTONIX) 40 MG tablet Take 1 tablet (40 mg total) by mouth daily. 03/01/22   Talbot Grumbling, FNP  PARoxetine (PAXIL) 20 MG tablet Take 20 mg by mouth daily as needed (hotflashes).     [provider]  sucralfate (CARAFATE) 1 g tablet Take 1 tablet (1 g total) by mouth 2 (two) times daily. 03/27/22   Dilcia Rybarczyk, Bernita Raisin, DO    Family History Family History  Problem Relation Age of Onset   Hyperlipidemia Mother    Arthritis Mother    Parkinson's disease Mother    Heart attack Father    Diabetes Father    Hypertension Father    Cancer Sister        cervical   Breast cancer Sister 7   Breast cancer Sister    Colon cancer Maternal Grandmother 23   Breast cancer Paternal Aunt    Esophageal cancer Neg Hx    Rectal cancer Neg Hx     Social History Social History   Tobacco Use    Smoking status: Every Day    Packs/day: 0.25    Years: 4.00    Pack years: 1.00    Types: Cigarettes   Smokeless tobacco: Never  Vaping Use   Vaping Use: Never used  Substance Use Topics   Alcohol use: No    Alcohol/week: 0.0 standard  drinks   Drug use: No    Comment: no drug use for 7 years     Allergies   Sulfa antibiotics   Review of Systems Review of Systems  All other systems reviewed and are negative. Per HPI  Physical Exam Triage Vital Signs ED Triage Vitals  Enc Vitals Group     BP 03/27/22 1016 (!) 152/84     Pulse Rate 03/27/22 1016 92     Resp 03/27/22 1016 20     Temp 03/27/22 1016 98.6 F (37 C)     Temp Source 03/27/22 1016 Oral     SpO2 03/27/22 1016 97 %     Weight --      Height --      Head Circumference --      Peak Flow --      Pain Score 03/27/22 1015 9     Pain Loc --      Pain Edu? --      Excl. in [place]? --    No data found.  Updated Vital Signs BP (!) 152/84 (BP Location: Left Arm)   Pulse 92   Temp 98.6 F (37 C) (Oral)   Resp 20   LMP 07/21/2013   SpO2 97%   Visual Acuity Right Eye Distance:   Left Eye Distance:   Bilateral Distance:    Right Eye Near:   Left Eye Near:    Bilateral Near:     Physical Exam Constitutional:      General: She is not in acute distress.    Appearance: Normal appearance. She is not ill-appearing.  HENT:     Head: Normocephalic and atraumatic.     Nose: Nose normal.     Mouth/Throat:     Mouth: Mucous membranes are moist.     Pharynx: Oropharynx is clear.     Comments: There is a small healing superficial abrasion on the right side of the tongue without any ulceration or surrounding erythema, there is some overlying granulation tissue Eyes:     Conjunctiva/sclera: Conjunctivae normal.  Neck:     Comments: Area of swelling is consistent with submandibular glands b/l, these are non-tender to palpation Cardiovascular:     Rate and Rhythm: Normal rate.  Pulmonary:     Effort: Pulmonary  effort is normal. No respiratory distress.  Musculoskeletal:     Cervical back: Neck supple. No rigidity or tenderness.     Right lower leg: No edema.     Left lower leg: No edema.  Lymphadenopathy:     Cervical: No cervical adenopathy.  Skin:    General: Skin is warm and dry.  Neurological:     Mental Status: She is alert and oriented to person, place, and time.     UC Treatments / Results  Labs (all labs ordered are listed, but only abnormal results are displayed) Labs Reviewed - No data to display  EKG   Radiology No results found.  Procedures Procedures (including critical care time)  Medications Ordered in UC Medications - No data to display  Initial Impression / Assessment and Plan / UC Course  I have reviewed the triage vital signs and the nursing notes.  Pertinent labs & imaging results that were available during my care of the patient were reviewed by me and considered in my medical decision making (see chart for details).     Discussed with patient that refill for valacyclovir is not warranted if she is not currently having an outbreak, she  voiced understanding.  Refill will be provided for simvastatin, sucralfate, gabapentin.  She will follow-up with her primary care provider for further management on 6/6.  In regards to the lesion on her tongue, discussed with her that it appears she might have bitten her tongue, no ulcerations or concerning findings on examination and it appears to be healing.  If this continues to be an issue, recommend follow-up with primary care provider versus a dentist.  The area that she reports is swollen in her neck appears to be her submandibular glands.  These are nontender.  No concerning findings for blockage or infection today.  Recommend follow-up with primary care provider if this continues to be an issue, otherwise reassurance provided.  Could consider Sjogren's work-up in the future if this continues to be an issue.   Final  Clinical Impressions(s) / UC Diagnoses   Final diagnoses:  Encounter for medication refill  Hyperlipidemia, unspecified hyperlipidemia type  Type 2 diabetes mellitus without complication, unspecified whether long term insulin use (HCC)  Submandibular gland swelling     Discharge Instructions      I have provided you a refill of the medications that you requested.  As we discussed, you do not have to take valacyclovir unless you are having an outbreak.  If you continue to have sores on her mouth, or the sores in your mouth or not healing, please follow-up with your primary care doctor or a dentist.  Make sure you see your primary care doctor to get further refills of your medication.  If the swelling of your neck gets larger or becomes painful, red, you have difficulty swallowing, you should be seen by medical provider right away.     ED Prescriptions     Medication Sig Dispense Auth. Provider   gabapentin (NEURONTIN) 300 MG capsule Take 1 capsule (300 mg total) by mouth at bedtime. 15 capsule Asma Boldon, Bernita Raisin, DO   sucralfate (CARAFATE) 1 g tablet Take 1 tablet (1 g total) by mouth 2 (two) times daily. 30 tablet Hollister Wessler, Bernita Raisin, DO   simvastatin (ZOCOR) 40 MG tablet Take 1 tablet (40 mg total) by mouth daily. 15 tablet Laurel Harnden, Bernita Raisin, DO      PDMP not reviewed this encounter.   MeccarielloBernita Raisin, DO 03/27/22 1055

## 2022-04-03 DIAGNOSIS — Z23 Encounter for immunization: Secondary | ICD-10-CM | POA: Diagnosis not present

## 2022-04-03 NOTE — Patient Instructions (Signed)
It was wonderful to see you today.  Please bring ALL of your medications with you to every visit.   Today we talked about:  -We are doing lab work today to check your cholesterol, your electrolytes/kidney function, screening for hepatitis C. I will send you a MyChart message if you have MyChart. Otherwise, I will give you a call for abnormal results or send a letter if everything returned back normal. If you don't hear from me in 2 weeks, please call the office.   -We will request your records.  -You can try black cohosh for hot flashes. -I am starting you on Buspar for your anxiety. Please schedule a follow up in 2-4 week to check in. -I am going to refer you to our social worker for grief counselor and mental health resources. -Call the Health Department: rabies vaccine, typhoid if stayiing withfreinds/fam or visiting smaller cities or rural areas, yellow fever  Therapy and Counseling Resources Most providers on this list will take Medicaid. Patients with commercial insurance or Medicare should contact their insurance company to get a list of in network providers.  [facility] (spanish speaking therapist available)(habla espanol)(take medicare and medicaid)  [place], [place], [place] [address], [place] [email or web address] [phone]  [facility] [address] [place], [place] [address] [place], [place], [place] [address]      [phone]  [place] (spanish available) [place], [place] [address] [place] (take [facility] and medicare) [address], [place], [place] [address]       [phone]     [place] (virtual only) [phone]  [facility]inny Bl[facility] [address], [place], [place]  [facility]:  [place]. [place]  [phone]  [facility]:  [place] [address] Rosie Fate [phone]  [facility] (under & uninsured) [address] [place] [phone]    [email or web address]    [place] [address]  [place]    [phone]  [facility] [place]     Phone:  [phone]     Sheldon Linden  [place] [address]      [place], [address] [place]: [place], [place], [place]   [place] ([place] therapist) [email or web address]  [place] [address]   [place] [address]    [phone]    [facility]   [address],  [place], [place]   [place]  [place]  [facility]  [address] [place], [place]        [phone]  Open Access/Walk In Clinic under & uninsured  [facility]  [address] [place], [place] Crisis [phone]  [facility] [place],  ([place])   [place], [place]: [phone]) 8:30 - 12; 1 - 2:30  [facility] the [place],  [place], [place]    [phone]):8:30 - 12; 2 - 3PM  [facility],  [address]  [place]; [phone]):   Mon -  Fri 8 AM - 5 PM  [facility] [place]  MWF 12:30 to 3:00 or call to schedule an appointment  [phone]  Specific Provider options Psychology Today  [email or web address] click on find a therapist  enter your zip code left side and select or tailor a therapist for your specific need.   [facility] Provider Directory [email or web address]  (Medicaid)   Follow all drop  down to find a provider  [place] or [email or web address] [address], [place], [place] Recovery support and educational   24- Hour Availability:   [facility]  [address] [place], [place] Crisis [phone]  [facility] [employer] [phone]  [place]  [phone]   [place]  [phone] (after hours)  [facility]/Mobile Crisis   [phone]  [place] National Suicide Hotline  [phone] Diamantina Monks)  Call 911 or go to emergency room  [facility]  [phone]);  [place] and [employer]  [phone]); [place], [place], [place], [place], [place], [place], [place]   If you are feeling suicidal or depression symptoms worsen please immediately go to:   If you are thinking about harming yourself or having thoughts of suicide, or if you know someone who is, seek help right away. If you are in crisis, make sure you are not left alone.  If someone else is in crisis, make sure he/she/they is not left alone  Call 988 OR 1-800-273-TALK  24 Hour Availability for [place]  [address] [place], [place] Crisis [phone]    Other crisis resources:  [facility] the [employer] (Domestic Violence, Rape & Victim Assistance [phone]  [facility]    (ONLY from 8am-4pm)    [phone]  [facility] Mobile Crisis Unit (24/7)   [phone]  [place] National Suicide Hotline   [phone] Diamantina Monks)   Thank you for choosing [facility].   Please call [phone] with any questions about today's appointment.  Please be sure to schedule follow up at the front  desk before you leave today.   Sharion Settler, DO PGY-2 Family Medicine

## 2022-04-03 NOTE — Progress Notes (Signed)
Subjective:    Patient ID: Melissa Pham, female    DOB: 16-Mar-1966, 56 y.o.   MRN: [id number]  CC: Establish care  HPI:  Melissa Pham is a very pleasant 56 y.o. female who presents today to establish care.  Initial concerns: CP, Light headedness, diarrhea. She has a history of episodes of CP, and had an episode of CP which woke her from sleep the night of 04/08/2022; had associated diaphoresis. Has had more frequent episodes recently. She has been seen in the ED for this in the past and this was thought to be related to GERD- EKG negative. Has SOB and some occasional chest discomfort with climbing stairs, but her CP usually occurs out of the blue. Has been using NTG for her CP, which sometimes helps this pain. No active chest pain currently. Has had intermittent diarrhea episodes for years, with most recent episode beginning on 04/04/2022.  She also reports tingling in left arm up to neck; also tingling in left leg. She is additionally concerned about prior reported finding of fluid in her lungs. She has had multiple family members die over a short period recently, and so has had higher levels of anxiety. Daughter died 4 years ago; all but one of her 5 children has passed. She would be interested in seeing a psychiatrist. PHQ 9 (question 9 negative)  She also has an upcoming trip to [place] in August and is planning a visit to the Health Department for vaccinations.   Past medical history: Hypertension, type 2 diabetes, dyslipidemia, CAD, GERD, tobacco abuse, bipolar affective disorder, schizophrenia, depression, anxiety  Past surgical history: Tubal ligation in 1989; L eye surgery 1970; cervical biopsy w loop electrode excision  Current medications: Acetaminophen, albuterol, aspirin, Symbicort, gabapentin, glimepiride, ibuprofen, lidocaine-glycerin, lisinopril, meloxicam, Toprol XL, Nasonex, multivitamin, omeprazole (was advised to stop this one), pantoprazole,  simvastatin  Family history: HLD in mother, father; diptheria in mother; Parkinson's in mother; MI in father; DM in father; HTN in father; breast cancer in one sister, cervical cancer in another sister; colon cancer in maternal grandmother; hx of sickle cell in family; kidney disease/dialysis in family; hx of strokes in family; pancreatic disease (unknown type) in family  Social history: Smoking: 0-1 ppd since she was around 56 yo No alcohol use currently outside of social drinking Hx of prior drug use (IV, pills, crack, pills, etc), none currently. LMP: Post-menopause Was exercising with exercise classes and swimming classes, but has stopped 01/2022 due to her episodes of CP. Not currently sexually active. 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   ROS: pertinent noted in the HPI   Objective:  BP 132/80   Pulse 92   Temp 98.3 F (36.8 C)   Ht '5\' 2"'$  (1.575 m)   Wt 200 lb (90.7 kg)   LMP 07/21/2013   SpO2 98%   BMI 36.58 kg/m    Vitals and nursing note reviewed  General: NAD, pleasant, able to participate in exam Cardiac: RRR, S1 S2 present. normal heart sounds, no murmurs. Respiratory: CTAB, normal effort, No wheezes, rales or rhonchi Extremities: no edema or cyanosis. Skin: warm and dry, no rashes noted Psych: Pressured speech, intermittently tearful, anxious-appearing  Assessment & Plan:   1. Encounter for hepatitis C screening test for low risk patient Screening lab obtained today. - Hepatitis C antibody (reflex, frozen specimen)  2. Type 2 diabetes mellitus with hyperglycemia, unspecified whether long term insulin use (HCC) Seems to be well controlled on Glimepiride- she was unable to tolerate  Metformin due to worsening GI side effects. A1c 6.2 today. Requesting records from prior PCP. - Continue Glimepiride   3. Dyslipidemia Will check lipid panel today.  - Lipid Panel - Continue statin   4. Primary hypertension Well controlled. Continue current management.  -  Basic Metabolic Panel  5. Bipolar affective disorder, remission status unspecified ([place]) Has not seen a Psychiatrist in quite some time. Has been trying to self-manage through church. She would benefit from further medication management. Was amenable to CCM referral. Discussed SSRI would be contraindicated.  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 Grief Has lost several family members in a short period of time. CCM referral for grief counseling.  - AMB Referral to [place]  8. Establishing care with new doctor, encounter for Drawing labs today. Requesting records from previous PCP and Psychiatrist, will follow up.  9. Anxiety Suspect that her chest pain is more-so related to anxiety. She has had normal cardiac work up in the past. No SI today. She does not appear to be a danger to herself or others at this time.  -Start Buspar -Recommend close follow up in 2-4 weeks to monitor   [place] [facility]; yearly appointments, last one last year Had Colonoscopy - done in [place], need records Pap smear more recently than in system- requesting records Got her COVID vaccines Got her Shingrex shot, pneumonia shot, flu shot --> all through pharmacy  History written by [place] Student.  I was personally present and performed or re-performed the history, physical exam and medical decision making activities of this service and have verified that the service and findings are accurately documented in the student's note.  Sharion Settler, DO                  04/09/2022, 3:23 PM Family Medicine Resident

## 2022-04-09 ENCOUNTER — Ambulatory Visit (INDEPENDENT_AMBULATORY_CARE_PROVIDER_SITE_OTHER): Payer: Medicare Other | Admitting: Family Medicine

## 2022-04-09 ENCOUNTER — Encounter: Payer: Self-pay | Admitting: Family Medicine

## 2022-04-09 VITALS — BP 132/80 | HR 92 | Temp 98.3°F | Ht 62.0 in | Wt 200.0 lb

## 2022-04-09 DIAGNOSIS — I10 Essential (primary) hypertension: Secondary | ICD-10-CM

## 2022-04-09 DIAGNOSIS — E785 Hyperlipidemia, unspecified: Secondary | ICD-10-CM | POA: Diagnosis not present

## 2022-04-09 DIAGNOSIS — Z7689 Persons encountering health services in other specified circumstances: Secondary | ICD-10-CM

## 2022-04-09 DIAGNOSIS — F4321 Adjustment disorder with depressed mood: Secondary | ICD-10-CM

## 2022-04-09 DIAGNOSIS — Z1211 Encounter for screening for malignant neoplasm of colon: Secondary | ICD-10-CM

## 2022-04-09 DIAGNOSIS — F319 Bipolar disorder, unspecified: Secondary | ICD-10-CM

## 2022-04-09 DIAGNOSIS — F419 Anxiety disorder, unspecified: Secondary | ICD-10-CM

## 2022-04-09 DIAGNOSIS — E1165 Type 2 diabetes mellitus with hyperglycemia: Secondary | ICD-10-CM

## 2022-04-09 DIAGNOSIS — Z1159 Encounter for screening for other viral diseases: Secondary | ICD-10-CM

## 2022-04-09 DIAGNOSIS — F201 Disorganized schizophrenia: Secondary | ICD-10-CM

## 2022-04-09 LAB — POCT GLYCOSYLATED HEMOGLOBIN (HGB A1C): HbA1c, POC (controlled diabetic range): 6.3 % (ref 0.0–7.0)

## 2022-04-09 MED ORDER — BUSPIRONE HCL 7.5 MG PO TABS
7.5000 mg | ORAL_TABLET | Freq: Two times a day (BID) | ORAL | 1 refills | Status: DC
Start: 2022-04-09 — End: 2022-07-16

## 2022-04-10 LAB — BASIC METABOLIC PANEL
BUN/Creatinine Ratio: 13 (ref 9–23)
BUN: 9 mg/dL (ref 6–24)
CO2: 23 mmol/L (ref 20–29)
Calcium: 8.5 mg/dL — ABNORMAL LOW (ref 8.7–10.2)
Chloride: 103 mmol/L (ref 96–106)
Creatinine, Ser: 0.68 mg/dL (ref 0.57–1.00)
Glucose: 97 mg/dL (ref 70–99)
Potassium: 3.6 mmol/L (ref 3.5–5.2)
Sodium: 141 mmol/L (ref 134–144)
eGFR: 102 mL/min/{1.73_m2} (ref >59)

## 2022-04-10 LAB — HCV INTERPRETATION

## 2022-04-10 LAB — LIPID PANEL
Chol/HDL Ratio: 4.2 ratio (ref 0.0–4.4)
Cholesterol, Total: 191 mg/dL (ref 100–199)
HDL: 46 mg/dL
LDL Chol Calc (NIH): 109 mg/dL — ABNORMAL HIGH (ref 0–99)
Triglycerides: 208 mg/dL — ABNORMAL HIGH (ref 0–149)
VLDL Cholesterol Cal: 36 mg/dL (ref 5–40)

## 2022-04-10 LAB — HCV AB W REFLEX TO QUANT PCR: HCV Ab: NONREACTIVE

## 2022-04-11 ENCOUNTER — Telehealth: Payer: Self-pay | Admitting: *Deleted

## 2022-04-11 NOTE — Chronic Care Management (AMB) (Signed)
  Care Management   Outreach Note  04/11/2022 Name: Melissa Pham MRN: [id number] DOB: 02-06-1966  Referred by: Sharion Settler, DO Reason for referral : Care Coordination (Initial outreach to schedule referral with BSW )   An unsuccessful telephone outreach was attempted today. The patient was referred to the case management team for assistance with care management and care coordination.   Follow Up Plan:  A HIPAA compliant phone message was left for the patient providing contact information and requesting a return call.  The care management team will reach out to the patient again over the next 5 days.  If patient returns call to provider office, please advise to call [place]* at [phone].*  [place] Management  Direct Dial: [phone]

## 2022-04-15 NOTE — Chronic Care Management (AMB) (Signed)
  Care Management   Outreach Note  04/15/2022 Name: Adesuwa Osgood MRN: [id number] DOB: [birth year]  Referred by: Sharion Settler, DO Reason for referral : Care Coordination (Initial outreach to schedule referral with BSW )   A second unsuccessful telephone outreach was attempted today. The patient was referred to the case management team for assistance with care management and care coordination.   Follow Up Plan:  A HIPAA compliant phone message was left for the patient providing contact information and requesting a return call.  The care management team will reach out to the patient again over the next 7 days.  If patient returns call to provider office, please advise to call [place]* at [phone].*  [place] Management  Direct Dial: [phone]

## 2022-04-17 NOTE — Chronic Care Management (AMB) (Signed)
  Care Management   Outreach Note  04/17/2022 Name: Melissa Pham MRN: [id number] DOB: January 04, 1966  Referred by: Sharion Settler, DO Reason for referral : Care Coordination (Initial outreach to schedule referral with BSW )   An unsuccessful telephone outreach was attempted today. The patient was referred to the case management team for assistance with care management and care coordination.  Patient had called and left a message to return call.   Follow Up Plan:  A HIPAA compliant phone message was left for the patient providing contact information and requesting a return call.  The care management team will reach out to the patient again over the next 7 days.  If patient returns call to provider office, please advise to call [place]* at [phone].*  [place] Management  Direct Dial: [phone]

## 2022-04-18 ENCOUNTER — Other Ambulatory Visit: Payer: Self-pay | Admitting: Family Medicine

## 2022-04-22 ENCOUNTER — Ambulatory Visit: Payer: Medicare Other

## 2022-04-22 ENCOUNTER — Other Ambulatory Visit: Payer: Self-pay | Admitting: *Deleted

## 2022-04-22 MED ORDER — PANTOPRAZOLE SODIUM 40 MG PO TBEC: 40.0000 mg | DELAYED_RELEASE_TABLET | Freq: Every day | ORAL | 1 refills | Status: DC

## 2022-04-22 NOTE — Chronic Care Management (AMB) (Signed)
  Care Management   Note  04/22/2022 Name: Melissa Pham MRN: [id number] DOB: 02/03/1966  Melissa Pham is a 56 y.o. year old female who is a primary care patient of Sharion Settler, DO. I reached out to [name] by phone today offer care coordination services.   Melissa Pham was given information about care management services today including:  Care management services include personalized support from designated clinical staff supervised by her physician, including individualized plan of care and coordination with other care providers 24/7 contact phone numbers for assistance for urgent and routine care needs. The patient may stop care management services at any time by phone call to the office staff.  Patient agreed to services and verbal consent obtained.   Follow up plan: Telephone appointment with care management team member scheduled for:05/14/22  [place] Management  Direct Dial: [phone]

## 2022-05-01 ENCOUNTER — Ambulatory Visit
Admission: RE | Admit: 2022-05-01 | Discharge: 2022-05-01 | Disposition: A | Discharge status: Home / Self Care | Payer: Medicare Other | Source: Ambulatory Visit | Attending: Internal Medicine | Admitting: Internal Medicine

## 2022-05-01 DIAGNOSIS — Z1231 Encounter for screening mammogram for malignant neoplasm of breast: Secondary | ICD-10-CM | POA: Diagnosis not present

## 2022-05-14 ENCOUNTER — Telehealth: Payer: Medicare Other

## 2022-05-14 ENCOUNTER — Telehealth: Payer: Self-pay | Admitting: Licensed Clinical Social Worker

## 2022-05-14 NOTE — Telephone Encounter (Signed)
  Care Management   Follow Up Note   05/14/2022 Name: Melissa Pham MRN: [id number] DOB: 12/29/1965   Referred by: Sharion Settler, DO Reason for referral : No chief complaint on file.   An unsuccessful telephone outreach was attempted today. The patient was referred to the case management team for assistance with care management and care coordination.   SW attempted twice. First phone call unable to leave VM. 2nd attempt , was able to leave VM, but VM was cut off by machine.   Follow Up Plan: The care management team will reach out to the patient again over the next 30 days.   Lenor Derrick , MSW Social Worker [facility] Care Management  [phone]

## 2022-05-27 ENCOUNTER — Telehealth: Payer: Self-pay | Admitting: *Deleted

## 2022-05-27 NOTE — Chronic Care Management (AMB) (Signed)
  Care Coordination  Note  05/27/2022 Name: Koda Defrank MRN: [id number] DOB: 1966/01/07  Rayah Fines is a 56 y.o. year old female who is a primary care patient of Sharion Settler, DO. I reached out to [name] by phone today to offer care coordination services.       Follow up plan: A second Unsuccessful telephone outreach attempt made. A HIPAA compliant phone message was left for the patient providing contact information and requesting a return call.  The care guide will reach out to the patient again over the next 7 days.  If patient calls provider office to request assistance with care coordination needs, please contact the care guide at the number below.   [place]  Direct Dial: [phone]

## 2022-06-04 NOTE — Chronic Care Management (AMB) (Signed)
  Care Coordination  Outreach Note  06/04/2022 Name: Melissa Pham MRN: [id number] DOB: 02-Feb-1966   Care Coordination Outreach Attempts  A third unsuccessful outreach was attempted today to offer the patient with information about available care coordination services as a benefit of their health plan.   Follow Up Plan:  No further outreach attempts will be made at this time. We have been unable to contact the patient to offer or enroll patient in care coordination services  Encounter Outcome:  No Answer  [place]: [phone]

## 2022-07-16 ENCOUNTER — Other Ambulatory Visit: Payer: Self-pay | Admitting: Family Medicine

## 2022-07-18 ENCOUNTER — Other Ambulatory Visit: Payer: Self-pay

## 2022-07-19 MED ORDER — METOPROLOL SUCCINATE ER 50 MG PO TB24: 50.0000 mg | ORAL_TABLET | Freq: Every day | ORAL | 1 refills | Status: DC

## 2022-07-19 MED ORDER — LISINOPRIL 5 MG PO TABS: 5.0000 mg | ORAL_TABLET | Freq: Every day | ORAL | 1 refills | Status: DC

## 2022-07-19 MED ORDER — SIMVASTATIN 40 MG PO TABS: 40.0000 mg | ORAL_TABLET | Freq: Every day | ORAL | 1 refills | Status: DC

## 2022-07-21 IMAGING — DX DG CHEST 2V
2 series · 2 of 2 positions shown · non-contrast
Comparison: Chest two views 05/23/2021

CLINICAL DATA: Shortness of breath.

EXAM:
CHEST - 2 VIEW

[chest pa]
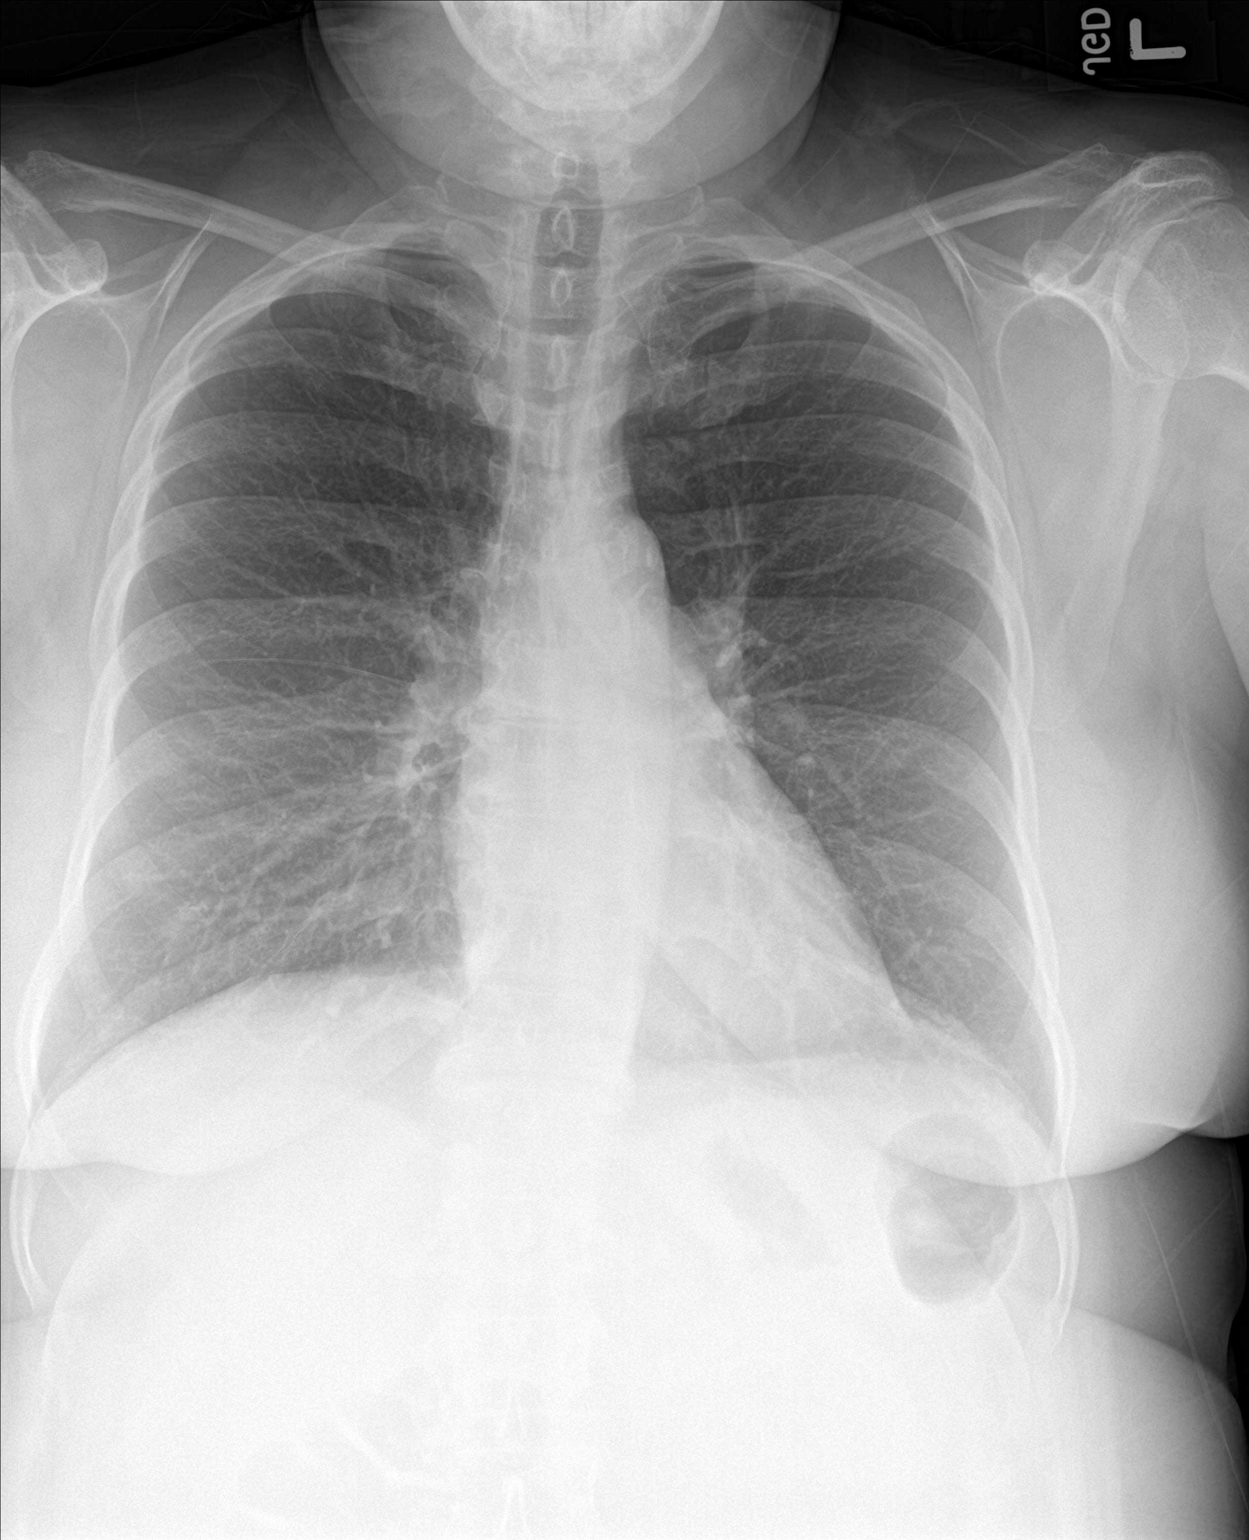

[chest lat]
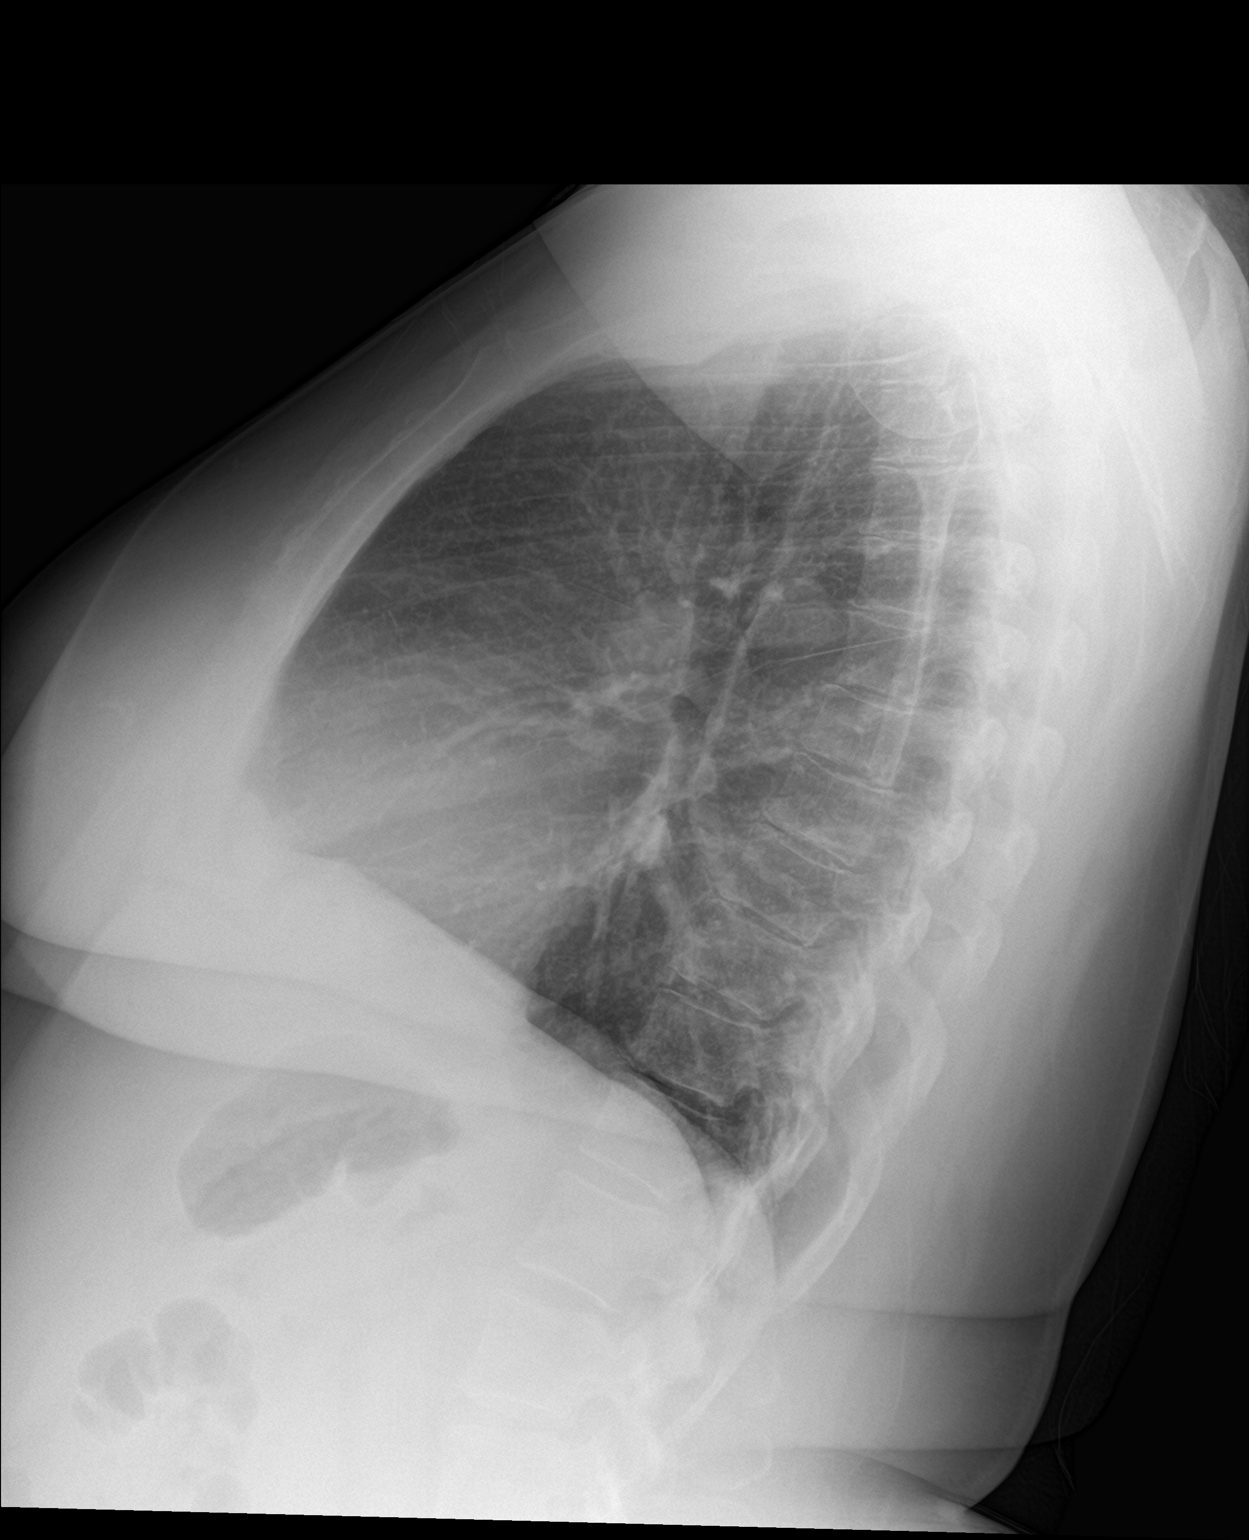

[2 of 2 positions shown; findings below may reference images not displayed]

FINDINGS: Cardiac silhouette and mediastinal contours are within normal
limits. Mild calcification within the aortic arch. The lungs are
clear. No pleural effusion or pneumothorax. Mild-to-moderate
multilevel degenerative disc changes of the thoracic spine.
IMPRESSION: No active cardiopulmonary disease.

## 2022-07-29 ENCOUNTER — Encounter (HOSPITAL_COMMUNITY): Payer: Self-pay | Admitting: Emergency Medicine

## 2022-07-29 ENCOUNTER — Ambulatory Visit (HOSPITAL_COMMUNITY)
Admission: EM | Admit: 2022-07-29 | Discharge: 2022-07-29 | Disposition: A | Discharge status: Home / Self Care | Payer: Medicare Other | Attending: Emergency Medicine | Admitting: Emergency Medicine

## 2022-07-29 DIAGNOSIS — K649 Unspecified hemorrhoids: Secondary | ICD-10-CM | POA: Insufficient documentation

## 2022-07-29 DIAGNOSIS — N898 Other specified noninflammatory disorders of vagina: Secondary | ICD-10-CM | POA: Insufficient documentation

## 2022-07-29 LAB — POCT URINALYSIS DIPSTICK, ED / UC
Bilirubin Urine: NEGATIVE
Glucose, UA: NEGATIVE mg/dL
Hgb urine dipstick: NEGATIVE
Ketones, ur: NEGATIVE mg/dL
Leukocytes,Ua: NEGATIVE
Nitrite: NEGATIVE
Protein, ur: 30 mg/dL — AB
Specific Gravity, Urine: 1.025 (ref 1.005–1.030)
Urobilinogen, UA: 0.2 mg/dL (ref 0.0–1.0)
pH: 5.5 (ref 5.0–8.0)

## 2022-07-29 MED ORDER — HYDROCORTISONE (PERIANAL) 2.5 % EX CREA: 1.0000 | TOPICAL_CREAM | Freq: Two times a day (BID) | CUTANEOUS | 0 refills | Status: DC

## 2022-07-29 MED ORDER — VALACYCLOVIR HCL 1 G PO TABS: 1000.0000 mg | ORAL_TABLET | Freq: Three times a day (TID) | ORAL | 0 refills | Status: DC

## 2022-07-29 MED ORDER — HYDROCORTISONE ACETATE 25 MG RE SUPP: 25.0000 mg | Freq: Two times a day (BID) | RECTAL | 0 refills | Status: DC

## 2022-07-29 NOTE — ED Provider Notes (Signed)
[place]    CSN: [id number] Arrival date & time: 07/29/22  5053      History   Chief Complaint Chief Complaint  Patient presents with   Abdominal Pain    HPI Melissa Pham is a 56 y.o. female.   Patient presents with lower abdominal pressure, urinary frequency and urgency for 7 days.  Concerned with possible urinary infection.  Endorses dysuria but relates this to current herpetic outbreak.  Has accompanying hemorrhoid flare as well which she has been using Tucks pads, deems effective.  Denies flank pain, fever, chills, hematuria, vaginal discharge, itching or odor.  Endorses that for herpetic outbreak that she has been using over-the-counter head and shoulders and is requesting refill for valacyclovir.  Denies sexual activity.   Past Medical History:  Diagnosis Date   ADHD (attention deficit hyperactivity disorder)    Anxiety    Asthma    Bipolar disorder ([place])    Blood transfusion without reported diagnosis 11/05/1983   after childbirth   Coronary artery disease    Depression    Diabetes mellitus without complication (HCC)    GERD (gastroesophageal reflux disease)    History of illicit drug use    Hyperlipidemia    Hypertension    Schizophrenia ([place])    Sickle cell trait ([place])    Tobacco use     Patient Active Problem List   Diagnosis Date Noted   Syncope 12/06/2018   CAD in native artery    Tobacco abuse 07/27/2018   Pure hypercholesterolemia    Generalized abdominal discomfort 12/10/2017   Effusion, right knee 03/13/2017   Sprain of right knee 03/13/2017   Chest pain 02/20/2015   Suprapubic pain 02/20/2015   Diarrhea 02/20/2015   Diabetes mellitus, type 2 ( [place]) 02/20/2015   HTN (hypertension) 02/20/2015   Dyslipidemia 02/20/2015   Bipolar affective disorder ([place]) 02/20/2015   Schizophrenia ([place]) 02/20/2015   Hypokalemia 02/20/2015    Past Surgical History:  Procedure Laterality Date   CERVICAL BIOPSY  W/ LOOP ELECTRODE EXCISION      left eye surgery  1970   TUBAL LIGATION  1989    OB History     Gravida  7   Para      Term      Preterm      AB  3   Living  4      SAB  1   IAB  2   Ectopic      Multiple      Live Births               Home Medications    Prior to Admission medications   Medication Sig Start Date End Date Taking? Authorizing Provider  acetaminophen (TYLENOL) 500 MG tablet Take 1,000 mg by mouth every 6 (six) hours as needed for mild pain.    [provider]  albuterol (PROVENTIL) (2.5 MG/3ML) 0.083% nebulizer solution Take 3 mLs (2.5 mg total) by nebulization every 6 (six) hours as needed for wheezing or shortness of breath. 03/01/22   Talbot Grumbling, FNP  albuterol (VENTOLIN HFA) 108 (90 Base) MCG/ACT inhaler Inhale 1-2 puffs into the lungs every 6 (six) hours as needed for wheezing or shortness of breath. 03/01/22   Talbot Grumbling, FNP  aspirin EC 81 MG tablet Take 81 mg by mouth daily.    [provider]  budesonide-formoterol (SYMBICORT) 80-4.5 MCG/ACT inhaler Inhale 2 puffs into the lungs once for 1 dose. 03/01/22 03/01/22  [place],  Stasia Cavalier, FNP  busPIRone (BUSPAR) 7.5 MG tablet Take 1 tablet by mouth twice daily 07/16/22   Sharion Settler, DO  clotrimazole (MYCELEX) 10 MG troche Take 1 tablet (10 mg total) by mouth 5 (five) times daily. Patient not taking: Reported on 04/09/2022 04/18/21   Katy Apo, NP  diclofenac Sodium (VOLTAREN) 1 % GEL Apply 4 g topically 4 (four) times daily. Patient not taking: Reported on 04/09/2022 02/23/20   Arlean Hopping C, PA-C  gabapentin (NEURONTIN) 300 MG capsule Take 1 capsule by mouth at bedtime 07/16/22   Sharion Settler, DO  glimepiride (AMARYL) 2 MG tablet Take 2 mg by mouth daily with breakfast.    [provider]  hydrocortisone (ANUSOL-HC) 25 MG suppository Place 1 suppository (25 mg total) rectally 2 (two) times daily. Patient not taking: Reported on 04/09/2022 02/23/20   Joy, Raquel Sarna C, PA-C   ibuprofen (ADVIL) 400 MG tablet Take 1 tablet (400 mg total) by mouth every 6 (six) hours as needed for moderate pain. 05/02/21   Jaynee Eagles, PA-C  lidocaine (XYLOCAINE) 2 % solution Use as directed 10 mLs in the mouth or throat every 3 (three) hours as needed for mouth pain (Swish and Spit out- do not swallow). Patient not taking: Reported on 04/09/2022 04/18/21   Katy Apo, NP  Lidocaine-Glycerin (PREPARATION H RE) Place 1 application rectally as needed (hemmroids).    [provider]  lisinopril (ZESTRIL) 5 MG tablet Take 1 tablet (5 mg total) by mouth daily. 07/19/22   Sharion Settler, DO  MELOXICAM PO Take by mouth.    [provider]  metoprolol succinate (TOPROL-XL) 50 MG 24 hr tablet Take 1 tablet (50 mg total) by mouth daily. Take with or immediately following a meal. 07/19/22   Espinoza, Alejandra, DO  mometasone (NASONEX) 50 MCG/ACT nasal spray Place 2 sprays into the nose 2 (two) times daily as needed (allergies).     [provider]  Multiple Vitamins-Minerals (MULTIVITAMIN WITH MINERALS) tablet Take 1 tablet by mouth daily.    [provider]  nitroGLYCERIN (NITROSTAT) 0.4 MG SL tablet Place 0.4 mg under the tongue every 5 (five) minutes as needed for chest pain. Patient not taking: Reported on 04/09/2022    [provider]  pantoprazole (PROTONIX) 40 MG tablet Take 1 tablet (40 mg total) by mouth daily. 04/22/22   Sharion Settler, DO  PARoxetine (PAXIL) 20 MG tablet Take 20 mg by mouth daily as needed (hotflashes).  Patient not taking: Reported on 04/09/2022    [provider]  simvastatin (ZOCOR) 40 MG tablet Take 1 tablet (40 mg total) by mouth daily. 07/19/22   Sharion Settler, DO  sucralfate (CARAFATE) 1 g tablet Take 1 tablet (1 g total) by mouth 2 (two) times daily. Patient not taking: Reported on 04/09/2022 03/27/22   Meccariello, Bernita Raisin, MD    Family History Family History  Problem Relation Age of Onset    Hyperlipidemia Mother    Arthritis Mother    Parkinson's disease Mother    Heart attack Father    Diabetes Father    Hypertension Father    Hyperlipidemia Father    Cancer Sister        cervical   Breast cancer Sister 38   Breast cancer Sister    Colon cancer Maternal Grandmother 29   Breast cancer Paternal Aunt    Kidney disease Other    Stroke Other    Pancreatic disease Other    Sickle cell anemia Other  Esophageal cancer Neg Hx    Rectal cancer Neg Hx     Social History Social History   Tobacco Use   Smoking status: Every Day    Packs/day: 0.25    Years: 4.00    Total pack years: 1.00    Types: Cigarettes   Smokeless tobacco: Never  Vaping Use   Vaping Use: Never used  Substance Use Topics   Alcohol use: No    Alcohol/week: 0.0 standard drinks of alcohol   Drug use: No    Comment: no drug use for 7 years     Allergies   Sulfa antibiotics   Review of Systems Review of Systems  Constitutional: Negative.   Respiratory: Negative.    Cardiovascular: Negative.   Gastrointestinal:  Positive for abdominal pain. Negative for abdominal distention, anal bleeding, blood in stool, constipation, diarrhea, nausea, rectal pain and vomiting.  Genitourinary:  Positive for frequency and urgency. Negative for decreased urine volume, difficulty urinating, dyspareunia, dysuria, enuresis, flank pain, genital sores, hematuria, menstrual problem, pelvic pain, vaginal bleeding, vaginal discharge and vaginal pain.  Musculoskeletal: Negative.   Skin: Negative.      Physical Exam Triage Vital Signs ED Triage Vitals  Enc Vitals Group     BP 07/29/22 1132 (!) 128/110     Pulse Rate 07/29/22 1132 78     Resp 07/29/22 1132 18     Temp 07/29/22 1132 97.7 F (36.5 C)     Temp Source 07/29/22 1132 Oral     SpO2 07/29/22 1132 96 %     Weight --      Height --      Head Circumference --      Peak Flow --      Pain Score 07/29/22 1129 9     Pain Loc --      Pain Edu? --       Excl. in [place]? --    No data found.  Updated Vital Signs BP (!) 128/110 (BP Location: Left Arm)   Pulse 78   Temp 97.7 F (36.5 C) (Oral)   Resp 18   LMP 07/21/2013   SpO2 96%   Visual Acuity Right Eye Distance:   Left Eye Distance:   Bilateral Distance:    Right Eye Near:   Left Eye Near:    Bilateral Near:     Physical Exam Constitutional:      Appearance: She is well-developed.  Pulmonary:     Effort: Pulmonary effort is normal.  Abdominal:     General: Bowel sounds are normal. There is no distension.     Tenderness: There is abdominal tenderness in the suprapubic area. There is no right CVA tenderness or left CVA tenderness.  Genitourinary:    Comments: deferred Neurological:     General: No focal deficit present.     Mental Status: She is alert and oriented to person, place, and time.  Psychiatric:        Mood and Affect: Mood normal.        Behavior: Behavior normal.      UC Treatments / Results  Labs (all labs ordered are listed, but only abnormal results are displayed) Labs Reviewed  POCT URINALYSIS DIPSTICK, ED / UC    EKG   Radiology No results found.  Procedures Procedures (including critical care time)  Medications Ordered in UC Medications - No data to display  Initial Impression / Assessment and Plan / UC Course  I have reviewed the triage vital signs  and the nursing notes.  Pertinent labs & imaging results that were available during my care of the patient were reviewed by me and considered in my medical decision making (see chart for details).  Vaginal irritation, hemorrhoids  Urinalysis is negative, discussed with patient, will rule out yeast and BV and will help to manage current herpetic outbreak and hemorrhoids, valacyclovir prescribed and hydrocortisone rectal cream, may continue to use Tucks pads, vaginal swab is pending, will treat additionally per protocol, may follow-up with urgent care if symptoms persist or worsen Final  Clinical Impressions(s) / UC Diagnoses   Final diagnoses:  None   Discharge Instructions   None    ED Prescriptions   None    PDMP not reviewed this encounter.   Hans Eden, NP 07/29/22 1213

## 2022-07-29 NOTE — Discharge Instructions (Signed)
Urinalysis is negative for a bladder infection  Your irritation may be coming from current herpes outbreak and hemorrhoids, we will also rule out presence of yeast and bacteria today  Begin valacyclovir 3 times daily (every 8 hours) for 7 days for management of herpes outbreak  Begin use of hydrocortisone suppository, insert rectally every morning and every evening for 6 days to help minimize hemorrhoids  For your hemorrhoid she may continue use of pads, have also been given information to general surgery is removal is the definitive treatment  Your  labs are pending to rule out yeast and bacteria, you will be notified of positive test results and additional medication sent to pharmacy as needed  You may follow-up with urgent care or primary doctor as needed for reevaluation

## 2022-07-29 NOTE — ED Triage Notes (Signed)
Pt reports lower abdominal pain and lower back pain x 1 week. Has been drinking Cranberry juice and increased water intake. Denies dysuria, hematuria. Also reports hemorrhoids and has been using tucks pads.  Also requesting a refill on Valtrex. Currently having a outbreak.

## 2022-07-30 ENCOUNTER — Other Ambulatory Visit: Payer: Self-pay

## 2022-07-30 ENCOUNTER — Emergency Department (HOSPITAL_COMMUNITY): Payer: Medicare Other

## 2022-07-30 ENCOUNTER — Emergency Department (HOSPITAL_COMMUNITY)
Admission: EM | Admit: 2022-07-30 | Discharge: 2022-07-30 | Disposition: A | Discharge status: Home / Self Care | Payer: Medicare Other | Attending: Emergency Medicine | Admitting: Emergency Medicine

## 2022-07-30 DIAGNOSIS — Z7982 Long term (current) use of aspirin: Secondary | ICD-10-CM | POA: Insufficient documentation

## 2022-07-30 DIAGNOSIS — Z7984 Long term (current) use of oral hypoglycemic drugs: Secondary | ICD-10-CM | POA: Diagnosis not present

## 2022-07-30 DIAGNOSIS — E876 Hypokalemia: Secondary | ICD-10-CM | POA: Insufficient documentation

## 2022-07-30 DIAGNOSIS — M542 Cervicalgia: Secondary | ICD-10-CM | POA: Diagnosis not present

## 2022-07-30 DIAGNOSIS — K5730 Diverticulosis of large intestine without perforation or abscess without bleeding: Secondary | ICD-10-CM | POA: Diagnosis not present

## 2022-07-30 DIAGNOSIS — Z79899 Other long term (current) drug therapy: Secondary | ICD-10-CM | POA: Diagnosis not present

## 2022-07-30 DIAGNOSIS — R1084 Generalized abdominal pain: Secondary | ICD-10-CM

## 2022-07-30 DIAGNOSIS — K5732 Diverticulitis of large intestine without perforation or abscess without bleeding: Secondary | ICD-10-CM

## 2022-07-30 DIAGNOSIS — R109 Unspecified abdominal pain: Secondary | ICD-10-CM | POA: Diagnosis not present

## 2022-07-30 DIAGNOSIS — R112 Nausea with vomiting, unspecified: Secondary | ICD-10-CM | POA: Diagnosis present

## 2022-07-30 DIAGNOSIS — E119 Type 2 diabetes mellitus without complications: Secondary | ICD-10-CM | POA: Diagnosis not present

## 2022-07-30 DIAGNOSIS — I10 Essential (primary) hypertension: Secondary | ICD-10-CM | POA: Insufficient documentation

## 2022-07-30 DIAGNOSIS — R001 Bradycardia, unspecified: Secondary | ICD-10-CM | POA: Insufficient documentation

## 2022-07-30 DIAGNOSIS — K5792 Diverticulitis of intestine, part unspecified, without perforation or abscess without bleeding: Secondary | ICD-10-CM | POA: Diagnosis not present

## 2022-07-30 DIAGNOSIS — R9431 Abnormal electrocardiogram [ECG] [EKG]: Secondary | ICD-10-CM | POA: Diagnosis not present

## 2022-07-30 LAB — COMPREHENSIVE METABOLIC PANEL
ALT: 21 U/L (ref 0–44)
AST: 20 U/L (ref 15–41)
Albumin: 3.3 g/dL — ABNORMAL LOW (ref 3.5–5.0)
Alkaline Phosphatase: 62 U/L (ref 38–126)
Anion gap: 8 (ref 5–15)
BUN: 10 mg/dL (ref 6–20)
CO2: 25 mmol/L (ref 22–32)
Calcium: 9.2 mg/dL (ref 8.9–10.3)
Chloride: 111 mmol/L (ref 98–111)
Creatinine, Ser: 0.57 mg/dL (ref 0.44–1.00)
GFR, Estimated: >60 mL/min (ref >60)
Glucose, Bld: 123 mg/dL — ABNORMAL HIGH (ref 70–99)
Potassium: 3.4 mmol/L — ABNORMAL LOW (ref 3.5–5.1)
Sodium: 144 mmol/L (ref 135–145)
Total Bilirubin: 0.4 mg/dL (ref 0.3–1.2)
Total Protein: 6.5 g/dL (ref 6.5–8.1)

## 2022-07-30 LAB — CBC
HCT: 37.4 % (ref 36.0–46.0)
Hemoglobin: 12.1 g/dL (ref 12.0–15.0)
MCH: 28.3 pg (ref 26.0–34.0)
MCHC: 32.4 g/dL (ref 30.0–36.0)
MCV: 87.6 fL (ref 80.0–100.0)
Platelets: 313 10*3/uL (ref 150–400)
RBC: 4.27 MIL/uL (ref 3.87–5.11)
RDW: 14.8 % (ref 11.5–15.5)
WBC: 6.7 10*3/uL (ref 4.0–10.5)
nRBC: 0 % (ref 0.0–0.2)

## 2022-07-30 LAB — URINALYSIS, MICROSCOPIC (REFLEX)

## 2022-07-30 LAB — URINALYSIS, ROUTINE W REFLEX MICROSCOPIC
Bilirubin Urine: NEGATIVE
Glucose, UA: NEGATIVE mg/dL
Ketones, ur: NEGATIVE mg/dL
Leukocytes,Ua: NEGATIVE
Nitrite: NEGATIVE
Protein, ur: NEGATIVE mg/dL
Specific Gravity, Urine: 1.02 (ref 1.005–1.030)
pH: 5 (ref 5.0–8.0)

## 2022-07-30 LAB — CERVICOVAGINAL ANCILLARY ONLY
Bacterial Vaginitis (gardnerella): NEGATIVE
Candida Glabrata: NEGATIVE
Candida Vaginitis: NEGATIVE
Comment: NEGATIVE
Comment: NEGATIVE
Comment: NEGATIVE

## 2022-07-30 LAB — LIPASE, BLOOD: Lipase: 26 U/L (ref 11–51)

## 2022-07-30 MED ORDER — IOHEXOL 350 MG/ML SOLN
75.0000 mL | Freq: Once | INTRAVENOUS | Status: AC | PRN
  Administered 2022-07-30: 75 mL via INTRAVENOUS

## 2022-07-30 MED ORDER — ALUM & MAG HYDROXIDE-SIMETH 200-200-20 MG/5ML PO SUSP
30.0000 mL | Freq: Once | ORAL | Status: AC
  Administered 2022-07-30: 30 mL via ORAL
  Filled 2022-07-30: qty 30

## 2022-07-30 MED ORDER — POTASSIUM CHLORIDE CRYS ER 20 MEQ PO TBCR
40.0000 meq | EXTENDED_RELEASE_TABLET | Freq: Once | ORAL | Status: AC
  Administered 2022-07-30: 40 meq via ORAL
  Filled 2022-07-30: qty 2

## 2022-07-30 MED ORDER — DICYCLOMINE HCL 10 MG PO CAPS
10.0000 mg | ORAL_CAPSULE | Freq: Once | ORAL | Status: AC
  Administered 2022-07-30: 10 mg via ORAL
  Filled 2022-07-30: qty 1

## 2022-07-30 MED ORDER — DICYCLOMINE HCL 20 MG PO TABS: 20.0000 mg | ORAL_TABLET | Freq: Two times a day (BID) | ORAL | 0 refills | Status: DC

## 2022-07-30 MED ORDER — AMOXICILLIN-POT CLAVULANATE 875-125 MG PO TABS: 1.0000 | ORAL_TABLET | Freq: Two times a day (BID) | ORAL | 0 refills | Status: DC

## 2022-07-30 NOTE — ED Notes (Signed)
Pt took tylenol from her bag from home

## 2022-07-30 NOTE — Discharge Instructions (Addendum)
As we discussed, CT imaging of your abdomen showed signs of diverticulitis.  I have attached a packet of information further detailing this diagnosis to this paperwork.  I have given you a prescription for an antibiotic for you to take as prescribed in its entirety for management of your symptoms.  I have also given you a prescription for Bentyl which can help with your pain.  You may take Tylenol and ibuprofen as well.  I strongly suggest that you schedule an appointment with your primary care doctor at your earliest convenience for follow-up.  Return if development of any new or worsening symptoms.

## 2022-07-30 NOTE — ED Provider Triage Note (Signed)
Emergency Medicine Provider Triage Evaluation Note  Melissa Pham , a 56 y.o. female  was evaluated in triage.  Pt complains of lower abd pain cramping. Sx for 1 week. Was seen at [facility] and has note from there.   Review of Systems  Positive: Abd pain, diarrhea Negative: Fever   Physical Exam  BP (!) 100/54   Pulse (!) 58   Temp 97.7 F (36.5 C) (Oral)   Resp (!) 35   Ht '5\' 2"'$  (1.575 m)   Wt 86.2 kg   LMP 07/21/2013   SpO2 97%   BMI 34.75 kg/m  Gen:   Awake, no distress  Resp:  Normal effort  MSK:   Moves extremities without difficulty  Other:  Abd obese soft nttp  Medical Decision Making  Medically screening exam initiated at 9:23 AM.  Appropriate orders placed.  Camrin Lapre was informed that the remainder of the evaluation will be completed by another provider, this initial triage assessment does not replace that evaluation, and the importance of remaining in the ED until their evaluation is complete.  [address]    Pati Gallo [place], [place] 07/30/22 [phone]

## 2022-07-30 NOTE — ED Triage Notes (Signed)
Pt. Stated, I was seen in UC yesterday and they did several test and discharged. Today Im still hurting in my stomach, but its my stomach.

## 2022-07-30 NOTE — ED Notes (Signed)
No answer for vital reassessment someone reported she went to the cafateria.

## 2022-07-30 NOTE — ED Provider Notes (Signed)
[facility] EMERGENCY DEPARTMENT Provider Note   CSN: [id number] Arrival date & time: 07/30/22  0753     History  Chief Complaint  Patient presents with   Abdominal Pain   Neck Pain   Arm Pain    Melissa Pham is a 56 y.o. female.  Patient with history of hypertension and diabetes presents today with complaints of abdominal pain. She states that same began about 10 days ago and waxes and wanes since onset. She states that there have been several instances where she has been in such discomfort that she is pouring sweat and unable to move due to the severity of her pain. Pain is located mostly in the epigastric region of her abdomen and feels 'like giving birth.' The pain has also awoken her from rest several times in the past week. She endorses associated nausea, vomiting, and diarrhea when the pain hits. States that she has had an increase in flatus as well. She has had a normal screening colonoscopy recently. Also endorses hx of tubal ligation in 1989, no other history of abdominal surgeries. Denies any history of symptoms like this in the past. She went to urgent care yesterday and had normal UA and wet prep, and GC/Chlamydia testing. She was discharged with hemorrhoid cream and valacyclovir. States that she is having a flare of her herpes and has had issues with hemorrhoids recently, but this pain is different from her normal symptoms associated with this.   The history is provided by the patient. No language interpreter was used.  Abdominal Pain Neck Pain Arm Pain Associated symptoms include abdominal pain.       Home Medications Prior to Admission medications   Medication Sig Start Date End Date Taking? Authorizing Provider  acetaminophen (TYLENOL) 500 MG tablet Take 1,000 mg by mouth every 6 (six) hours as needed for mild pain.    [provider]  albuterol (PROVENTIL) (2.5 MG/3ML) 0.083% nebulizer solution Take 3 mLs (2.5 mg total) by nebulization  every 6 (six) hours as needed for wheezing or shortness of breath. 03/01/22   Talbot Grumbling, FNP  albuterol (VENTOLIN HFA) 108 (90 Base) MCG/ACT inhaler Inhale 1-2 puffs into the lungs every 6 (six) hours as needed for wheezing or shortness of breath. 03/01/22   Talbot Grumbling, FNP  aspirin EC 81 MG tablet Take 81 mg by mouth daily.    [provider]  budesonide-formoterol (SYMBICORT) 80-4.5 MCG/ACT inhaler Inhale 2 puffs into the lungs once for 1 dose. 03/01/22 03/01/22  Talbot Grumbling, FNP  busPIRone (BUSPAR) 7.5 MG tablet Take 1 tablet by mouth twice daily 07/16/22   Sharion Settler, DO  clotrimazole (MYCELEX) 10 MG troche Take 1 tablet (10 mg total) by mouth 5 (five) times daily. Patient not taking: Reported on 04/09/2022 04/18/21   Katy Apo, NP  diclofenac Sodium (VOLTAREN) 1 % GEL Apply 4 g topically 4 (four) times daily. Patient not taking: Reported on 04/09/2022 02/23/20   Arlean Hopping C, PA-C  gabapentin (NEURONTIN) 300 MG capsule Take 1 capsule by mouth at bedtime 07/16/22   Sharion Settler, DO  glimepiride (AMARYL) 2 MG tablet Take 2 mg by mouth daily with breakfast.    [provider]  hydrocortisone (ANUSOL-HC) 2.5 % rectal cream Place 1 Application rectally 2 (two) times daily. 07/29/22   White, Leitha Schuller, NP  ibuprofen (ADVIL) 400 MG tablet Take 1 tablet (400 mg total) by mouth every 6 (six) hours as needed for moderate pain. 05/02/21  Jaynee Eagles, PA-C  lidocaine (XYLOCAINE) 2 % solution Use as directed 10 mLs in the mouth or throat every 3 (three) hours as needed for mouth pain (Swish and Spit out- do not swallow). Patient not taking: Reported on 04/09/2022 04/18/21   Katy Apo, NP  Lidocaine-Glycerin (PREPARATION H RE) Place 1 application rectally as needed (hemmroids).    [provider]  lisinopril (ZESTRIL) 5 MG tablet Take 1 tablet (5 mg total) by mouth daily. 07/19/22   Sharion Settler, DO  MELOXICAM PO Take by mouth.     [provider]  metoprolol succinate (TOPROL-XL) 50 MG 24 hr tablet Take 1 tablet (50 mg total) by mouth daily. Take with or immediately following a meal. 07/19/22   Espinoza, Alejandra, DO  mometasone (NASONEX) 50 MCG/ACT nasal spray Place 2 sprays into the nose 2 (two) times daily as needed (allergies).     [provider]  Multiple Vitamins-Minerals (MULTIVITAMIN WITH MINERALS) tablet Take 1 tablet by mouth daily.    [provider]  nitroGLYCERIN (NITROSTAT) 0.4 MG SL tablet Place 0.4 mg under the tongue every 5 (five) minutes as needed for chest pain. Patient not taking: Reported on 04/09/2022    [provider]  pantoprazole (PROTONIX) 40 MG tablet Take 1 tablet (40 mg total) by mouth daily. 04/22/22   Sharion Settler, DO  PARoxetine (PAXIL) 20 MG tablet Take 20 mg by mouth daily as needed (hotflashes).  Patient not taking: Reported on 04/09/2022    [provider]  simvastatin (ZOCOR) 40 MG tablet Take 1 tablet (40 mg total) by mouth daily. 07/19/22   Sharion Settler, DO  sucralfate (CARAFATE) 1 g tablet Take 1 tablet (1 g total) by mouth 2 (two) times daily. Patient not taking: Reported on 04/09/2022 03/27/22   Meccariello, Bernita Raisin, MD  valACYclovir (VALTREX) 1000 MG tablet Take 1 tablet (1,000 mg total) by mouth 3 (three) times daily. 07/29/22   Hans Eden, NP      Allergies    Sulfa antibiotics    Review of Systems   Review of Systems  Gastrointestinal:  Positive for abdominal pain.  Musculoskeletal:  Positive for neck pain.  All other systems reviewed and are negative.   Physical Exam Updated Vital Signs BP (!) 143/88 (BP Location: Right Arm)   Pulse 85   Temp 98.3 F (36.8 C) (Oral)   Resp 18   Ht '5\' 2"'$  (1.575 m)   Wt 86.2 kg   LMP 07/21/2013   SpO2 100%   BMI 34.75 kg/m  Physical Exam Vitals and nursing note reviewed.  Constitutional:      General: She is not in acute distress.    Appearance: Normal  appearance. She is normal weight. She is not ill-appearing, toxic-appearing or diaphoretic.  HENT:     Head: Normocephalic and atraumatic.  Cardiovascular:     Rate and Rhythm: Normal rate.  Pulmonary:     Effort: Pulmonary effort is normal. No respiratory distress.  Abdominal:     General: Abdomen is flat.     Palpations: Abdomen is soft.     Tenderness: There is abdominal tenderness.  Musculoskeletal:        General: Normal range of motion.     Cervical back: Normal range of motion.  Skin:    General: Skin is warm and dry.  Neurological:     General: No focal deficit present.     Mental Status: She is alert.  Psychiatric:  Mood and Affect: Mood normal.        Behavior: Behavior normal.     ED Results / Procedures / Treatments   Labs (all labs ordered are listed, but only abnormal results are displayed) Labs Reviewed  COMPREHENSIVE METABOLIC PANEL - Abnormal; Notable for the following components:      Result Value   Potassium 3.4 (*)    Glucose, Bld 123 (*)    Albumin 3.3 (*)    All other components within normal limits  URINALYSIS, ROUTINE W REFLEX MICROSCOPIC - Abnormal; Notable for the following components:   Hgb urine dipstick TRACE (*)    All other components within normal limits  URINALYSIS, MICROSCOPIC (REFLEX) - Abnormal; Notable for the following components:   Bacteria, UA FEW (*)    All other components within normal limits  LIPASE, BLOOD  CBC    EKG EKG Interpretation  Date/Time:  Tuesday July 30 2022 09:00:16 EDT Ventricular Rate:  53 PR Interval:  146 QRS Duration: 82 QT Interval:  460 QTC Calculation: 431 R Axis:   53 Text Interpretation: Sinus bradycardia Nonspecific T wave abnormality Abnormal ECG  similar to April 2023 Confirmed by Sherwood Gambler [phone]) on 07/30/2022 3:32:47 PM  Radiology No results found.  Procedures Procedures    Medications Ordered in ED Medications - No data to display  ED Course/ Medical Decision  Making/ A&P                           Medical Decision Making Amount and/or Complexity of Data Reviewed Labs: ordered. Radiology: ordered.   This patient presents to the ED for concern of abdominal pain, this involves an extensive number of treatment options, and is a complaint that carries with it a high risk of complications and morbidity.  Co morbidities that complicate the patient evaluation  Hx hypertension and diabetes   Additional history obtained:  Additional history obtained from epic chart review   Lab Tests:  I Ordered, and personally interpreted labs.  The pertinent results include:  no leukocytosis or anemia, K 3.4, UA noninfectious   Imaging Studies ordered:  I ordered imaging studies including CT abdomen pelvis  I independently visualized and interpreted imaging which showed  Sigmoid diverticulosis. Subtle/slight haziness around the sigmoid colon could reflect early acute diverticulitis. I agree with the radiologist interpretation   Cardiac Monitoring: / EKG:  The patient was maintained on a cardiac monitor.  I personally viewed and interpreted the cardiac monitored which showed an underlying rhythm of: sinus bradycardia   Problem List / ED Course / Critical interventions / Medication management  I ordered medication including oral potassium, GI cocktail, and bentyl  for hypokalemia and pain  Reevaluation of the patient after these medicines showed that the patient improved I have reviewed the patients home medicines and have made adjustments as needed   Test / Admission - Considered:  Patient presents today with complaints of abdominal pain. Patient is nontoxic, nonseptic appearing, in no apparent distress.  Patient's pain and other symptoms adequately managed in emergency department.  Fluid bolus given.  Labs, imaging and vitals reviewed.  Patient does not meet the SIRS or Sepsis criteria.  On repeat exam patient does not have a surgical abdomin and  there are no peritoneal signs.  No indication of appendicitis, bowel obstruction, bowel perforation, cholecystitis, PID or ectopic pregnancy. CT imaging did show signs of early diverticulitis. Will treat for same with Augmentin. Patient also endorsed some improvement  with Bentyl, will give prescription for same as well. She is stable for discharge. Patient discharged home and given strict instructions for follow-up with their primary care physician.  I have also discussed reasons to return immediately to the ER.  Patient expresses understanding and agrees with plan. Patient discharged in stable condition.   Final Clinical Impression(s) / ED Diagnoses Final diagnoses:  Generalized abdominal pain  Diverticulitis of colon  Hypokalemia    Rx / DC Orders ED Discharge Orders          Ordered    amoxicillin-clavulanate (AUGMENTIN) 875-125 MG tablet  Every 12 hours        07/30/22 2044    dicyclomine (BENTYL) 20 MG tablet  2 times daily        07/30/22 2044          An After Visit Summary was printed and given to the patient.     Nestor Lewandowsky 07/30/22 2046    Sherwood Gambler, MD 07/31/22 [phone]

## 2022-08-13 DIAGNOSIS — E119 Type 2 diabetes mellitus without complications: Secondary | ICD-10-CM | POA: Diagnosis not present

## 2022-08-15 ENCOUNTER — Other Ambulatory Visit: Payer: Self-pay | Admitting: Family Medicine

## 2022-08-19 ENCOUNTER — Encounter (HOSPITAL_COMMUNITY): Payer: Self-pay

## 2022-08-19 ENCOUNTER — Ambulatory Visit (INDEPENDENT_AMBULATORY_CARE_PROVIDER_SITE_OTHER): Payer: Medicare Other

## 2022-08-19 ENCOUNTER — Ambulatory Visit (HOSPITAL_COMMUNITY)
Admission: EM | Admit: 2022-08-19 | Discharge: 2022-08-19 | Disposition: A | Discharge status: Home / Self Care | Payer: Medicare Other | Attending: Physician Assistant | Admitting: Physician Assistant

## 2022-08-19 DIAGNOSIS — R102 Pelvic and perineal pain: Secondary | ICD-10-CM | POA: Insufficient documentation

## 2022-08-19 DIAGNOSIS — J45909 Unspecified asthma, uncomplicated: Secondary | ICD-10-CM | POA: Diagnosis not present

## 2022-08-19 DIAGNOSIS — E119 Type 2 diabetes mellitus without complications: Secondary | ICD-10-CM | POA: Insufficient documentation

## 2022-08-19 DIAGNOSIS — R109 Unspecified abdominal pain: Secondary | ICD-10-CM

## 2022-08-19 DIAGNOSIS — F17210 Nicotine dependence, cigarettes, uncomplicated: Secondary | ICD-10-CM | POA: Insufficient documentation

## 2022-08-19 DIAGNOSIS — R110 Nausea: Secondary | ICD-10-CM | POA: Insufficient documentation

## 2022-08-19 DIAGNOSIS — R1084 Generalized abdominal pain: Secondary | ICD-10-CM | POA: Insufficient documentation

## 2022-08-19 DIAGNOSIS — R197 Diarrhea, unspecified: Secondary | ICD-10-CM | POA: Diagnosis not present

## 2022-08-19 DIAGNOSIS — Z7984 Long term (current) use of oral hypoglycemic drugs: Secondary | ICD-10-CM | POA: Diagnosis not present

## 2022-08-19 DIAGNOSIS — R059 Cough, unspecified: Secondary | ICD-10-CM | POA: Insufficient documentation

## 2022-08-19 LAB — COMPREHENSIVE METABOLIC PANEL
ALT: 24 U/L (ref 0–44)
AST: 37 U/L (ref 15–41)
Albumin: 3.6 g/dL (ref 3.5–5.0)
Alkaline Phosphatase: 59 U/L (ref 38–126)
Anion gap: 8 (ref 5–15)
BUN: 6 mg/dL (ref 6–20)
CO2: 29 mmol/L (ref 22–32)
Calcium: 9.2 mg/dL (ref 8.9–10.3)
Chloride: 109 mmol/L (ref 98–111)
Creatinine, Ser: 0.62 mg/dL (ref 0.44–1.00)
GFR, Estimated: >60 mL/min (ref >60)
Glucose, Bld: 115 mg/dL — ABNORMAL HIGH (ref 70–99)
Potassium: 3.7 mmol/L (ref 3.5–5.1)
Sodium: 146 mmol/L — ABNORMAL HIGH (ref 135–145)
Total Bilirubin: 0.9 mg/dL (ref 0.3–1.2)
Total Protein: 6.8 g/dL (ref 6.5–8.1)

## 2022-08-19 LAB — CBC WITH DIFFERENTIAL/PLATELET
Abs Immature Granulocytes: 0.02 10*3/uL (ref 0.00–0.07)
Basophils Absolute: 0 10*3/uL (ref 0.0–0.1)
Basophils Relative: 0 %
Eosinophils Absolute: 0.2 10*3/uL (ref 0.0–0.5)
Eosinophils Relative: 4 %
HCT: 36 % (ref 36.0–46.0)
Hemoglobin: 12.3 g/dL (ref 12.0–15.0)
Immature Granulocytes: 0 %
Lymphocytes Relative: 37 %
Lymphs Abs: 2 10*3/uL (ref 0.7–4.0)
MCH: 28.7 pg (ref 26.0–34.0)
MCHC: 34.2 g/dL (ref 30.0–36.0)
MCV: 84.1 fL (ref 80.0–100.0)
Monocytes Absolute: 0.5 10*3/uL (ref 0.1–1.0)
Monocytes Relative: 9 %
Neutro Abs: 2.6 10*3/uL (ref 1.7–7.7)
Neutrophils Relative %: 50 %
Platelets: 295 10*3/uL (ref 150–400)
RBC: 4.28 MIL/uL (ref 3.87–5.11)
RDW: 14 % (ref 11.5–15.5)
WBC: 5.4 10*3/uL (ref 4.0–10.5)
nRBC: 0 % (ref 0.0–0.2)

## 2022-08-19 NOTE — ED Triage Notes (Signed)
Pt presents with continued abdominal pain after being diagnosed with diverticulitis last month.   Pt also endorses a cough x 1 week. Reports otc medication is not working.

## 2022-08-19 NOTE — Discharge Instructions (Signed)
Monitor your MyChart for results.  I will send you a message if all of your results are normal and call you if there is anything I am concerned about.  We should get your blood work back within a day and the stool studies within 3 to 5 days after you are able to give [place] a specimen.  We are open from 8 AM to 8 PM Monday through Friday.  Please call and schedule an appointment as soon as possible with stomach specialist.  If anything changes or worsens you need to go to the emergency room immediately.

## 2022-08-19 NOTE — ED Provider Notes (Addendum)
[facility]    CSN: [id number] Arrival date & time: 08/19/22  1007      History   Chief Complaint Chief Complaint  Patient presents with   Abdominal Pain   Cough    HPI Melissa Pham is a 56 y.o. female.   Patient presents today with a several month history of diarrhea.  She reports that this has become more persistent and she is now having 10+ watery bowel movements per day as well as some anal leakage.  She was seen in the emergency room on 07/30/2022 at which point she was started on Augmentin for possible early diverticulitis.  Reports her symptoms had already started prior to this.  She did have some improvement with antibiotics but continues to have significant abdominal pain and diarrhea.  She denies any melena or hematochezia.  Reports nausea without vomiting.  Denies any fever.  She denies any suspicious food intake, recent travel, known sick contacts, medication changes.  Denies additional antibiotics outside of Augmentin 07/30/2022.  She denies history of gastrointestinal disorder.  She has not seen primary care or GI; she has initial appointment with PCP 09/17/2022.  She reports abdominal pain is rated 6 on a 0-10 pain scale, generalized throughout her abdomen but worse in the lower abdomen, described as cramping, no aggravating or alleviating factors identified.  She has tried Kaopectate over-the-counter with improvement but not resolution of symptoms.  In addition, she reports persistent cough.  She initially had traditional URI symptoms but these have improved and now she continues to have dry cough.  They began soon after she got her flu/COVID vaccines.  She does have a history of asthma and has been taking her breathing treatment with temporary improvement of symptoms.  Denies any recent steroid use.  She does have a history of diabetes and her last A1c was appropriate at 6.3% in June 2023.  She is not taking any over-the-counter medications.  Has taken Augmentin  recently for diverticulitis but denies additional antibiotics.  She does smoke cigarettes.    Past Medical History:  Diagnosis Date   ADHD (attention deficit hyperactivity disorder)    Anxiety    Asthma    Bipolar disorder ([place])    Blood transfusion without reported diagnosis 11/05/1983   after childbirth   Coronary artery disease    Depression    Diabetes mellitus without complication (HCC)    GERD (gastroesophageal reflux disease)    History of illicit drug use    Hyperlipidemia    Hypertension    Schizophrenia ([place])    Sickle cell trait ([place])    Tobacco use     Patient Active Problem List   Diagnosis Date Noted   Syncope 12/06/2018   CAD in native artery    Tobacco abuse 07/27/2018   Pure hypercholesterolemia    Generalized abdominal discomfort 12/10/2017   Effusion, right knee 03/13/2017   Sprain of right knee 03/13/2017   Chest pain 02/20/2015   Suprapubic pain 02/20/2015   Diarrhea 02/20/2015   Diabetes mellitus, type 2 ([place]) 02/20/2015   HTN (hypertension) 02/20/2015   Dyslipidemia 02/20/2015   Bipolar affective disorder ([place]) 02/20/2015   Schizophrenia ([place]) 02/20/2015   Hypokalemia 02/20/2015    Past Surgical History:  Procedure Laterality Date   CERVICAL BIOPSY  W/ LOOP ELECTRODE EXCISION     left eye surgery  [place]    OB History     Gravida  7   Para  Term      Preterm      AB  3   Living  4      SAB  1   IAB  2   Ectopic      Multiple      Live Births               Home Medications    Prior to Admission medications   Medication Sig Start Date End Date Taking? Authorizing Provider  acetaminophen (TYLENOL) 500 MG tablet Take 1,000 mg by mouth every 6 (six) hours as needed for mild pain.    [provider]  albuterol (PROVENTIL) (2.5 MG/3ML) 0.083% nebulizer solution Take 3 mLs (2.5 mg total) by nebulization every 6 (six) hours as needed for wheezing or shortness of breath. 03/01/22    Talbot Grumbling, FNP  albuterol (VENTOLIN HFA) 108 (90 Base) MCG/ACT inhaler Inhale 1-2 puffs into the lungs every 6 (six) hours as needed for wheezing or shortness of breath. 03/01/22   Talbot Grumbling, FNP  aspirin EC 81 MG tablet Take 81 mg by mouth daily.    [provider]  budesonide-formoterol (SYMBICORT) 80-4.5 MCG/ACT inhaler Inhale 2 puffs into the lungs once for 1 dose. 03/01/22 03/01/22  Talbot Grumbling, FNP  busPIRone (BUSPAR) 7.5 MG tablet Take 1 tablet by mouth twice daily 07/16/22   Sharion Settler, DO  clotrimazole (MYCELEX) 10 MG troche Take 1 tablet (10 mg total) by mouth 5 (five) times daily. Patient not taking: Reported on 04/09/2022 04/18/21   Katy Apo, NP  diclofenac Sodium (VOLTAREN) 1 % GEL Apply 4 g topically 4 (four) times daily. Patient not taking: Reported on 04/09/2022 02/23/20   Joy, Helane Gunther, PA-C  dicyclomine (BENTYL) 20 MG tablet Take 1 tablet (20 mg total) by mouth 2 (two) times daily. 07/30/22   Smoot, Leary Roca, PA-C  gabapentin (NEURONTIN) 300 MG capsule Take 1 capsule by mouth at bedtime 07/16/22   Sharion Settler, DO  glimepiride (AMARYL) 2 MG tablet Take 2 mg by mouth daily with breakfast.    [provider]  hydrocortisone (ANUSOL-HC) 2.5 % rectal cream Place 1 Application rectally 2 (two) times daily. 07/29/22   White, Leitha Schuller, NP  ibuprofen (ADVIL) 400 MG tablet Take 1 tablet (400 mg total) by mouth every 6 (six) hours as needed for moderate pain. 05/02/21   Jaynee Eagles, PA-C  lidocaine (XYLOCAINE) 2 % solution Use as directed 10 mLs in the mouth or throat every 3 (three) hours as needed for mouth pain (Swish and Spit out- do not swallow). Patient not taking: Reported on 04/09/2022 04/18/21   Katy Apo, NP  Lidocaine-Glycerin (PREPARATION H RE) Place 1 application rectally as needed (hemmroids).    [provider]  lisinopril (ZESTRIL) 5 MG tablet Take 1 tablet (5 mg total) by mouth daily. 07/19/22    Sharion Settler, DO  MELOXICAM PO Take by mouth.    [provider]  metoprolol succinate (TOPROL-XL) 50 MG 24 hr tablet Take 1 tablet (50 mg total) by mouth daily. Take with or immediately following a meal. 07/19/22   Espinoza, Alejandra, DO  mometasone (NASONEX) 50 MCG/ACT nasal spray Place 2 sprays into the nose 2 (two) times daily as needed (allergies).     [provider]  Multiple Vitamins-Minerals (MULTIVITAMIN WITH MINERALS) tablet Take 1 tablet by mouth daily.    [provider]  nitroGLYCERIN (NITROSTAT) 0.4 MG SL tablet Place 0.4 mg under the tongue  every 5 (five) minutes as needed for chest pain. Patient not taking: Reported on 04/09/2022    [provider]  pantoprazole (PROTONIX) 40 MG tablet Take 1 tablet by mouth once daily 08/15/22   Sharion Settler, DO  PARoxetine (PAXIL) 20 MG tablet Take 20 mg by mouth daily as needed (hotflashes).  Patient not taking: Reported on 04/09/2022    [provider]  simvastatin (ZOCOR) 40 MG tablet Take 1 tablet (40 mg total) by mouth daily. 07/19/22   Sharion Settler, DO  sucralfate (CARAFATE) 1 g tablet Take 1 tablet (1 g total) by mouth 2 (two) times daily. Patient not taking: Reported on 04/09/2022 03/27/22   Meccariello, Bernita Raisin, MD  valACYclovir (VALTREX) 1000 MG tablet Take 1 tablet (1,000 mg total) by mouth 3 (three) times daily. 07/29/22   Hans Eden, NP    Family History Family History  Problem Relation Age of Onset   Hyperlipidemia Mother    Arthritis Mother    Parkinson's disease Mother    Heart attack Father    Diabetes Father    Hypertension Father    Hyperlipidemia Father    Cancer Sister        cervical   Breast cancer Sister 46   Breast cancer Sister    Colon cancer Maternal Grandmother 85   Breast cancer Paternal Aunt    Kidney disease Other    Stroke Other    Pancreatic disease Other    Sickle cell anemia Other    Esophageal cancer Neg Hx    Rectal cancer Neg  Hx     Social History Social History   Tobacco Use   Smoking status: Every Day    Packs/day: 0.25    Years: 4.00    Total pack years: 1.00    Types: Cigarettes   Smokeless tobacco: Never  Vaping Use   Vaping Use: Never used  Substance Use Topics   Alcohol use: No    Alcohol/week: 0.0 standard drinks of alcohol   Drug use: No    Comment: no drug use for 7 years     Allergies   Sulfa antibiotics   Review of Systems Review of Systems  Constitutional:  Positive for activity change. Negative for appetite change, fatigue and fever.  HENT:  Negative for congestion, sinus pressure, sneezing and sore throat.   Respiratory:  Positive for cough. Negative for shortness of breath.   Cardiovascular:  Negative for chest pain.  Gastrointestinal:  Positive for abdominal pain, diarrhea and nausea. Negative for blood in stool, constipation and vomiting.  Neurological:  Negative for dizziness, light-headedness and headaches.     Physical Exam Triage Vital Signs ED Triage Vitals  Enc Vitals Group     BP 08/19/22 1049 (!) 163/85     Pulse Rate 08/19/22 1049 80     Resp 08/19/22 1049 17     Temp 08/19/22 1049 98.3 F (36.8 C)     Temp src --      SpO2 08/19/22 1049 93 %     Weight --      Height --      Head Circumference --      Peak Flow --      Pain Score 08/19/22 1048 7     Pain Loc --      Pain Edu? --      Excl. in [place]? --    No data found.  Updated Vital Signs BP (!) 163/85   Pulse 80  Temp 98.3 F (36.8 C)   Resp 17   LMP 07/21/2013   SpO2 93%   Visual Acuity Right Eye Distance:   Left Eye Distance:   Bilateral Distance:    Right Eye Near:   Left Eye Near:    Bilateral Near:     Physical Exam Vitals reviewed.  Constitutional:      General: She is awake. She is not in acute distress.    Appearance: Normal appearance. She is well-developed. She is not ill-appearing.     Comments: Very pleasant female appears stated age in no acute distress sitting  comfortably in exam room  HENT:     Head: Normocephalic and atraumatic.     Nose: Nose normal.     Mouth/Throat:     Pharynx: Uvula midline. No oropharyngeal exudate or posterior oropharyngeal erythema.  Cardiovascular:     Rate and Rhythm: Normal rate and regular rhythm.     Heart sounds: Normal heart sounds, S1 normal and S2 normal. No murmur heard. Pulmonary:     Effort: Pulmonary effort is normal.     Breath sounds: Normal breath sounds. No wheezing, rhonchi or rales.     Comments: Clear to auscultation bilaterally Abdominal:     General: Bowel sounds are normal.     Palpations: Abdomen is soft.     Tenderness: There is generalized abdominal tenderness. There is no right CVA tenderness, left CVA tenderness, guarding or rebound.     Comments: No evidence of acute abdomen on physical exam  Psychiatric:        Behavior: Behavior is cooperative.      UC Treatments / Results  Labs (all labs ordered are listed, but only abnormal results are displayed) Labs Reviewed  GASTROINTESTINAL PANEL BY PCR, STOOL (REPLACES STOOL CULTURE)  C DIFFICILE QUICK SCREEN W PCR REFLEX    CBC WITH DIFFERENTIAL/PLATELET  COMPREHENSIVE METABOLIC PANEL    EKG   Radiology DG Abdomen 1 View  Result Date: 08/19/2022 CLINICAL DATA:  Abdominal pain.  History of diverticulitis. EXAM: ABDOMEN - 1 VIEW COMPARISON:  CT 07/30/2022 FINDINGS: The bowel gas pattern is normal. No radio-opaque calculi or other significant radiographic abnormality are seen. IMPRESSION: Negative. Electronically Signed   By: Nelson Chimes M.D.   On: 08/19/2022 11:20    Procedures Procedures (including critical care time)  Medications Ordered in UC Medications - No data to display  Initial Impression / Assessment and Plan / UC Course  I have reviewed the triage vital signs and the nursing notes.  Pertinent labs & imaging results that were available during my care of the patient were reviewed by me and considered in my medical  decision making (see chart for details).     Patient is well-appearing, afebrile, nontoxic, nontachycardic.  She has mild tenderness palpation throughout abdomen with no evidence of acute abdomen or focal tenderness on exam.  Unclear etiology of symptoms but more concern for inflammatory bowel disease given widespread symptoms.  Additional antibiotics were deferred as clinical presentation is not consistent with acute diverticulitis.  KUB was obtained that showed no acute abnormality.  CBC and CMP were obtained today-results pending.  Will obtain stool specimens but patient was unable to provide a specimen in clinic.  She will bring this back when she is available.  Discussed that ultimately she will need to see gastroenterology was given contact information for local provider with instruction to call to schedule an appointment.  Recommended that she rest and drink plenty of fluid.  She is to eat a bland diet and avoid spicy/acidic/fatty foods.  Discussed that we do not have more advanced imaging capabilities in urgent care and if she has worsening abdominal pain, blood in her stool, fever, nausea, vomiting, weakness she needs to go to the ER.  Strict return precautions given.  Suspect that URI symptoms are related to allergy/asthma.  No evidence of acute infection on physical exam that would warrant initiation of antibiotics.  Patient has been symptomatic for over a week so no indication for viral testing.  Recommend she continue over-the-counter medications for symptom management as well as prescribed medicine including Symbicort.  If she has persistent or worsening symptoms she is to return for reevaluation.  Strongly encouraged to follow-up with primary care as scheduled next month.  Addendum: Stool studies were obtained given prolonged episode of diarrhea with 10+ watery bowel movements per 24 hours to rule out infectious etiology.  Final Clinical Impressions(s) / UC Diagnoses   Final diagnoses:   Diarrhea, unspecified type  Generalized abdominal pain     Discharge Instructions      Monitor your MyChart for results.  I will send you a message if all of your results are normal and call you if there is anything I am concerned about.  We should get your blood work back within a day and the stool studies within 3 to 5 days after you are able to give [place] a specimen.  We are open from 8 AM to 8 PM Monday through Friday.  Please call and schedule an appointment as soon as possible with stomach specialist.  If anything changes or worsens you need to go to the emergency room immediately.     ED Prescriptions   None    PDMP not reviewed this encounter.   Terrilee Croak, PA-C 08/19/22 1233    Praneel Haisley, Derry Skill, PA-C 08/28/22 1557

## 2022-08-20 ENCOUNTER — Encounter: Payer: Self-pay | Admitting: Internal Medicine

## 2022-08-21 LAB — GASTROINTESTINAL PANEL BY PCR, STOOL (REPLACES STOOL CULTURE)

## 2022-09-17 ENCOUNTER — Ambulatory Visit: Payer: Medicare Other | Attending: Nurse Practitioner | Admitting: Nurse Practitioner

## 2022-09-17 ENCOUNTER — Encounter: Payer: Self-pay | Admitting: Nurse Practitioner

## 2022-09-17 VITALS — BP 163/76 | HR 89 | Temp 98.0°F | Ht 62.0 in | Wt 191.6 lb

## 2022-09-17 DIAGNOSIS — Z8349 Family history of other endocrine, nutritional and metabolic diseases: Secondary | ICD-10-CM | POA: Diagnosis not present

## 2022-09-17 DIAGNOSIS — F411 Generalized anxiety disorder: Secondary | ICD-10-CM | POA: Diagnosis not present

## 2022-09-17 DIAGNOSIS — E7800 Pure hypercholesterolemia, unspecified: Secondary | ICD-10-CM | POA: Diagnosis not present

## 2022-09-17 DIAGNOSIS — Z7689 Persons encountering health services in other specified circumstances: Secondary | ICD-10-CM

## 2022-09-17 DIAGNOSIS — E119 Type 2 diabetes mellitus without complications: Secondary | ICD-10-CM

## 2022-09-17 DIAGNOSIS — I10 Essential (primary) hypertension: Secondary | ICD-10-CM | POA: Diagnosis not present

## 2022-09-17 DIAGNOSIS — J4520 Mild intermittent asthma, uncomplicated: Secondary | ICD-10-CM | POA: Diagnosis not present

## 2022-09-17 DIAGNOSIS — E1142 Type 2 diabetes mellitus with diabetic polyneuropathy: Secondary | ICD-10-CM | POA: Diagnosis not present

## 2022-09-17 DIAGNOSIS — K219 Gastro-esophageal reflux disease without esophagitis: Secondary | ICD-10-CM

## 2022-09-17 DIAGNOSIS — Z87891 Personal history of nicotine dependence: Secondary | ICD-10-CM

## 2022-09-17 DIAGNOSIS — I2583 Coronary atherosclerosis due to lipid rich plaque: Secondary | ICD-10-CM

## 2022-09-17 DIAGNOSIS — I2510 Atherosclerotic heart disease of native coronary artery without angina pectoris: Secondary | ICD-10-CM

## 2022-09-17 DIAGNOSIS — B009 Herpesviral infection, unspecified: Secondary | ICD-10-CM

## 2022-09-17 DIAGNOSIS — F4310 Post-traumatic stress disorder, unspecified: Secondary | ICD-10-CM

## 2022-09-17 MED ORDER — ALBUTEROL SULFATE HFA 108 (90 BASE) MCG/ACT IN AERS: 1.0000 | INHALATION_SPRAY | Freq: Four times a day (QID) | RESPIRATORY_TRACT | 1 refills | Status: DC | PRN

## 2022-09-17 MED ORDER — LISINOPRIL 5 MG PO TABS: 5.0000 mg | ORAL_TABLET | Freq: Every day | ORAL | 1 refills | Status: DC

## 2022-09-17 MED ORDER — ACCU-CHEK GUIDE VI STRP: ORAL_STRIP | 12 refills | Status: AC

## 2022-09-17 MED ORDER — VALACYCLOVIR HCL 1 G PO TABS: 1000.0000 mg | ORAL_TABLET | Freq: Three times a day (TID) | ORAL | 3 refills | Status: DC

## 2022-09-17 MED ORDER — BUSPIRONE HCL 7.5 MG PO TABS: 7.5000 mg | ORAL_TABLET | Freq: Two times a day (BID) | ORAL | 0 refills | Status: DC

## 2022-09-17 MED ORDER — ACCU-CHEK GUIDE ME W/DEVICE KIT: 1.0000 | PACK | Freq: Once | 0 refills | Status: AC

## 2022-09-17 MED ORDER — BUDESONIDE-FORMOTEROL FUMARATE 160-4.5 MCG/ACT IN AERO: 2.0000 | INHALATION_SPRAY | Freq: Once | RESPIRATORY_TRACT | 12 refills | Status: DC

## 2022-09-17 MED ORDER — SIMVASTATIN 40 MG PO TABS: 40.0000 mg | ORAL_TABLET | Freq: Every day | ORAL | 1 refills | Status: DC

## 2022-09-17 MED ORDER — GABAPENTIN 300 MG PO CAPS: 300.0000 mg | ORAL_CAPSULE | Freq: Every day | ORAL | 1 refills | Status: DC

## 2022-09-17 MED ORDER — PANTOPRAZOLE SODIUM 40 MG PO TBEC: 40.0000 mg | DELAYED_RELEASE_TABLET | Freq: Every day | ORAL | 1 refills | Status: DC

## 2022-09-17 MED ORDER — GLIMEPIRIDE 2 MG PO TABS: 2.0000 mg | ORAL_TABLET | Freq: Every day | ORAL | 1 refills | Status: DC

## 2022-09-17 MED ORDER — METOPROLOL SUCCINATE ER 50 MG PO TB24: 50.0000 mg | ORAL_TABLET | Freq: Every day | ORAL | 1 refills | Status: DC

## 2022-09-17 MED ORDER — ACCU-CHEK SOFTCLIX LANCETS MISC: 12 refills | Status: DC

## 2022-09-17 MED ORDER — PAROXETINE HCL 20 MG PO TABS: 20.0000 mg | ORAL_TABLET | Freq: Every day | ORAL | 1 refills | Status: DC

## 2022-09-17 NOTE — Progress Notes (Signed)
Assessment & Plan:  Melissa Pham was seen today for establish care.  Diagnoses and all orders for this visit:  Encounter to establish care  GAD (generalized anxiety disorder) -     busPIRone (BUSPAR) 7.5 MG tablet; Take 1 tablet (7.5 mg total) by mouth 2 (two) times daily. For anxiety -     PARoxetine (PAXIL) 20 MG tablet; Take 1 tablet (20 mg total) by mouth daily. -     Ambulatory referral to [facility]  Pure hypercholesterolemia -     simvastatin (ZOCOR) 40 MG tablet; Take 1 tablet (40 mg total) by mouth daily. Lab Results  Component Value Date   LDLCALC 109 (H) 04/09/2022   INSTRUCTIONS: Work on a low fat, heart healthy diet and participate in regular aerobic exercise program by working out at least 150 minutes per week; 5 days a week-30 minutes per day. Avoid red meat/beef/steak,  fried foods. junk foods, sodas, sugary drinks, unhealthy snacking, alcohol and smoking.  Drink at least 80 oz of water per day and monitor your carbohydrate intake daily.    Family history of thyroid disorder -     Thyroid Panel With TSH  Coronary artery disease due to lipid rich plaque She was dismissed from Dr. Theodosia Pham office. Needs to see a new Cardiologist. Has history of CAD, HTN, HPL, tobacco dependence and angina -     Ambulatory referral to Cardiology  Type 2 diabetes mellitus without complication, without long-term current use of insulin (HCC) -     glimepiride (AMARYL) 2 MG tablet; Take 1 tablet (2 mg total) by mouth daily with breakfast. -     Ambulatory referral to Ophthalmology -     Accu-Chek Softclix Lancets lancets; Use as instructed. Check blood glucose level by fingerstick 2 times per day. -     Blood Glucose Monitoring Suppl (ACCU-CHEK GUIDE ME) w/Device KIT; 1 each by Does not apply route once for 1 dose. -     glucose blood (ACCU-CHEK GUIDE) test strip; Use as instructed. Check blood glucose by fingerstick twice per day.  Personal history of nicotine dependence -     CT  CHEST LUNG CA SCREEN LOW DOSE W/O CM; Future  Primary hypertension -     metoprolol succinate (TOPROL-XL) 50 MG 24 hr tablet; Take 1 tablet (50 mg total) by mouth daily. Take with or immediately following a meal. -     lisinopril (ZESTRIL) 5 MG tablet; Take 1 tablet (5 mg total) by mouth daily. Continue all antihypertensives as prescribed.  Reminded to bring in blood pressure log for follow  up appointment.  RECOMMENDATIONS: DASH/Mediterranean Diets are healthier choices for HTN.    Mild intermittent asthma without complication -     albuterol (VENTOLIN HFA) 108 (90 Base) MCG/ACT inhaler; Inhale 1-2 puffs into the lungs every 6 (six) hours as needed for wheezing or shortness of breath. -     budesonide-formoterol (SYMBICORT) 160-4.5 MCG/ACT inhaler; Inhale 2 puffs into the lungs once for 1 dose.  Diabetic polyneuropathy associated with type 2 diabetes mellitus (HCC) -     gabapentin (NEURONTIN) 300 MG capsule; Take 1 capsule (300 mg total) by mouth at bedtime.  PTSD (post-traumatic stress disorder) -     PARoxetine (PAXIL) 20 MG tablet; Take 1 tablet (20 mg total) by mouth daily.  GERD without esophagitis -     pantoprazole (PROTONIX) 40 MG tablet; Take 1 tablet (40 mg total) by mouth daily. INSTRUCTIONS: Avoid GERD Triggers: acidic, spicy or fried foods, caffeine,  coffee, sodas,  alcohol and chocolate.    HSV (herpes simplex virus) infection -     valACYclovir (VALTREX) 1000 MG tablet; Take 1 tablet (1,000 mg total) by mouth 3 (three) times daily.    Patient has been counseled on age-appropriate routine health concerns for screening and prevention. These are reviewed and up-to-date. Referrals have been placed accordingly. Immunizations are up-to-date or declined.    Subjective:   Chief Complaint  Patient presents with   Establish Care   HPI Melissa Pham 56 y.o. female presents to office today to establish care.  He has a past medical history of ADHD, Anxiety, Asthma, Bipolar  disorder, Blood transfusion without reported diagnosis (11/05/1983), Coronary artery disease, Depression, DM2, GERD, History of illicit drug use, Hemorrhoids, Hyperlipidemia, Hypertension, Schizophrenia, Sickle cell trait, PTSD and Tobacco use.   Patient has been counseled on age-appropriate routine health concerns for screening and prevention. These are reviewed and up-to-date. Referrals have been placed accordingly. Immunizations are up-to-date or declined.     MAMMOGRAM: UTD 05-02-2022 PAP SMEAR:OVER DUE COLONOSCOPY: OVERDUE  Tobacco Dependence Started smoking at the age of 77. She smokes between 1 ppd and 2 ppd when she is stressed. Other days she smokes about 6-7 cigarettes per day.    Mood Disorder She needs to follow up with Psychiatry for her bipolar disorder, PTSD and GAD. She is a pleasant  lady however very hyperactive today. Will likely need other mood stabilizing medication as well as psychotherapy. Will refill buspar today. She takes paxil for hot flashes.   DM 2 Diabetes is well controlled with glimepiride 2 mg daily. Diabetic complications include peripheral neuropathy for which she is prescribed gabapentin. She needs instructions on how to use her meter. States she has dyslexia and has a hard time reading instructions.  Lab Results  Component Value Date   HGBA1C 6.3 04/09/2022   Lab Results  Component Value Date   LDLCALC 109 (H) 04/09/2022     HTN She has been out her blood pressure medications: lisinopril 5 mg and metoprolol XL 50 mg daily for quite some time. Will refill today and have her return in a few weeks for BP check. Blood pressure is elevated today. She does not monitor her blood pressure at home.  BP Readings from Last 3 Encounters:  09/17/22 (!) 163/76  08/19/22 (!) 163/85  07/30/22 (!) 151/75    Asthma She is being followed by pulmonology. Notes asthma is well controlled with daily use of symbicort and prn use of albuterol.  Review of Systems   Constitutional:  Negative for fever, malaise/fatigue and weight loss.  HENT: Negative.  Negative for nosebleeds.   Eyes: Negative.  Negative for blurred vision, double vision and photophobia.  Respiratory: Negative.  Negative for cough and shortness of breath.   Cardiovascular: Negative.  Negative for chest pain, palpitations and leg swelling.  Gastrointestinal: Negative.  Negative for heartburn, nausea and vomiting.  Musculoskeletal: Negative.  Negative for myalgias.  Neurological: Negative.  Negative for dizziness, focal weakness, seizures and headaches.  Psychiatric/Behavioral:  Positive for depression. Negative for suicidal ideas. The patient is nervous/anxious and has insomnia.     Past Medical History:  Diagnosis Date   ADHD (attention deficit hyperactivity disorder)    Anxiety    Asthma    Bipolar disorder ([place])    Blood transfusion without reported diagnosis 11/05/1983   after childbirth   Coronary artery disease    Depression    Diabetes mellitus without complication ([place])  GERD (gastroesophageal reflux disease)    History of illicit drug use    Hyperlipidemia    Hypertension    Schizophrenia (HCC)    Sickle cell trait ([place])    Tobacco use     Past Surgical History:  Procedure Laterality Date   CERVICAL BIOPSY  W/ LOOP ELECTRODE EXCISION     left eye surgery  [facility]    Family History  Problem Relation Age of Onset   Hyperlipidemia Mother    Arthritis Mother    Parkinson's disease Mother    Heart attack Father    Diabetes Father    Hypertension Father    Hyperlipidemia Father    Cancer Sister        cervical   Breast cancer Sister 22   Breast cancer Sister    Colon cancer Maternal Grandmother 74   Breast cancer Paternal Aunt    Kidney disease Other    Stroke Other    Pancreatic disease Other    Sickle cell anemia Other    Esophageal cancer Neg Hx    Rectal cancer Neg Hx     Social History Reviewed with no changes to be made  today.   Outpatient Medications Prior to Visit  Medication Sig Dispense Refill   albuterol (PROVENTIL) (2.5 MG/3ML) 0.083% nebulizer solution Take 3 mLs (2.5 mg total) by nebulization every 6 (six) hours as needed for wheezing or shortness of breath. 75 mL 12   aspirin EC 81 MG tablet Take 81 mg by mouth daily.     diclofenac Sodium (VOLTAREN) 1 % GEL Apply 4 g topically 4 (four) times daily. 100 g 2   dicyclomine (BENTYL) 20 MG tablet Take 1 tablet (20 mg total) by mouth 2 (two) times daily. 20 tablet 0   ibuprofen (ADVIL) 400 MG tablet Take 1 tablet (400 mg total) by mouth every 6 (six) hours as needed for moderate pain. 30 tablet 0   Multiple Vitamins-Minerals (MULTIVITAMIN WITH MINERALS) tablet Take 1 tablet by mouth daily.     nitroGLYCERIN (NITROSTAT) 0.4 MG SL tablet Place 0.4 mg under the tongue every 5 (five) minutes as needed for chest pain.     sucralfate (CARAFATE) 1 g tablet Take 1 tablet (1 g total) by mouth 2 (two) times daily. 30 tablet 0   albuterol (VENTOLIN HFA) 108 (90 Base) MCG/ACT inhaler Inhale 1-2 puffs into the lungs every 6 (six) hours as needed for wheezing or shortness of breath. 18 g 1   gabapentin (NEURONTIN) 300 MG capsule Take 1 capsule by mouth at bedtime 30 capsule 0   lisinopril (ZESTRIL) 5 MG tablet Take 1 tablet (5 mg total) by mouth daily. 90 tablet 1   omeprazole (PRILOSEC) 40 MG capsule Take 40 mg by mouth daily.     pantoprazole (PROTONIX) 40 MG tablet Take 1 tablet by mouth once daily 30 tablet 0   PARoxetine (PAXIL) 20 MG tablet Take 20 mg by mouth daily as needed (hotflashes).     simvastatin (ZOCOR) 40 MG tablet Take 1 tablet (40 mg total) by mouth daily. 90 tablet 1   valACYclovir (VALTREX) 1000 MG tablet Take 1 tablet (1,000 mg total) by mouth 3 (three) times daily. 21 tablet 0   clotrimazole (MYCELEX) 10 MG troche Take 1 tablet (10 mg total) by mouth 5 (five) times daily. (Patient not taking: Reported on 04/09/2022) 70 Troche 0   hydrocortisone  (ANUSOL-HC) 2.5 % rectal cream Place 1 Application rectally 2 (  two) times daily. (Patient not taking: Reported on 09/17/2022) 30 g 0   Lidocaine-Glycerin (PREPARATION H RE) Place 1 application rectally as needed (hemmroids). (Patient not taking: Reported on 09/17/2022)     MELOXICAM PO Take by mouth. (Patient not taking: Reported on 09/17/2022)     acetaminophen (TYLENOL) 500 MG tablet Take 1,000 mg by mouth every 6 (six) hours as needed for mild pain. (Patient not taking: Reported on 09/17/2022)     budesonide-formoterol (SYMBICORT) 80-4.5 MCG/ACT inhaler Inhale 2 puffs into the lungs once for 1 dose. (Patient not taking: Reported on 09/17/2022) 1 each 12   busPIRone (BUSPAR) 7.5 MG tablet Take 1 tablet by mouth twice daily (Patient not taking: Reported on 09/17/2022) 60 tablet 0   glimepiride (AMARYL) 2 MG tablet Take 2 mg by mouth daily with breakfast. (Patient not taking: Reported on 09/17/2022)     lidocaine (XYLOCAINE) 2 % solution Use as directed 10 mLs in the mouth or throat every 3 (three) hours as needed for mouth pain (Swish and Spit out- do not swallow). (Patient not taking: Reported on 04/09/2022) 100 mL 0   metoprolol succinate (TOPROL-XL) 50 MG 24 hr tablet Take 1 tablet (50 mg total) by mouth daily. Take with or immediately following a meal. (Patient not taking: Reported on 09/17/2022) 90 tablet 1   mometasone (NASONEX) 50 MCG/ACT nasal spray Place 2 sprays into the nose 2 (two) times daily as needed (allergies).  (Patient not taking: Reported on 09/17/2022)     No facility-administered medications prior to visit.    Allergies  Allergen Reactions   Sulfa Antibiotics Anaphylaxis       Objective:    BP (!) 163/76   Pulse 89   Temp 98 F (36.7 C) (Temporal)   Ht _0  (1.575 m)   Wt 191 lb 9.6 oz (86.9 kg)   LMP 07/21/2013   SpO2 98%   BMI 35.04 kg/m  Wt Readings from Last 3 Encounters:  09/17/22 191 lb 9.6 oz (86.9 kg)  07/30/22 190 lb (86.2 kg)  04/09/22 200 lb (90.7  kg)    Physical Exam Vitals and nursing note reviewed. Exam conducted with a chaperone present.  Constitutional:      Appearance: She is well-developed.  HENT:     Head: Normocephalic and atraumatic.  Cardiovascular:     Rate and Rhythm: Normal rate and regular rhythm.     Heart sounds: Normal heart sounds. No murmur heard.    No friction rub. No gallop.  Pulmonary:     Effort: Pulmonary effort is normal. No tachypnea or respiratory distress.     Breath sounds: Normal breath sounds. No decreased breath sounds, wheezing, rhonchi or rales.  Chest:     Chest wall: No tenderness.  Abdominal:     General: Bowel sounds are normal.     Palpations: Abdomen is soft.  Musculoskeletal:        General: Normal range of motion.     Cervical back: Normal range of motion.  Skin:    General: Skin is warm and dry.  Neurological:     Mental Status: She is alert and oriented to person, place, and time.     Coordination: Coordination normal.  Psychiatric:        Attention and Perception: Attention normal.        Mood and Affect: Mood is anxious.        Behavior: Behavior is hyperactive. Behavior is cooperative.        Thought  Content: Thought content normal.        Judgment: Judgment normal.          Patient has been counseled extensively about nutrition and exercise as well as the importance of adherence with medications and regular follow-up. The patient was given clear instructions to go to ER or return to medical center if symptoms don't improve, worsen or new problems develop. The patient verbalized understanding.   Follow-up: Return for 4 weeks meter check and BP Check with luke. See me in february 1110 or 410 appt ONLY.   Gildardo Pounds, FNP-BC [facility] and [place], [place]   09/17/2022, 9:04 PM

## 2022-09-18 LAB — THYROID PANEL WITH TSH
Free Thyroxine Index: 1.5 (ref 1.2–4.9)
T3 Uptake Ratio: 23 % — ABNORMAL LOW (ref 24–39)
T4, Total: 6.6 ug/dL (ref 4.5–12.0)
TSH: 0.87 u[IU]/mL (ref 0.450–4.500)

## 2022-09-19 ENCOUNTER — Other Ambulatory Visit: Payer: Self-pay | Admitting: Pharmacist

## 2022-09-19 ENCOUNTER — Ambulatory Visit: Payer: Medicare Other | Attending: Cardiovascular Disease | Admitting: Cardiovascular Disease

## 2022-09-19 ENCOUNTER — Encounter: Payer: Self-pay | Admitting: Cardiovascular Disease

## 2022-09-19 VITALS — BP 126/72 | HR 80 | Ht 61.0 in | Wt 193.8 lb

## 2022-09-19 DIAGNOSIS — E785 Hyperlipidemia, unspecified: Secondary | ICD-10-CM

## 2022-09-19 DIAGNOSIS — E7800 Pure hypercholesterolemia, unspecified: Secondary | ICD-10-CM

## 2022-09-19 DIAGNOSIS — I2510 Atherosclerotic heart disease of native coronary artery without angina pectoris: Secondary | ICD-10-CM

## 2022-09-19 DIAGNOSIS — Z720 Tobacco use: Secondary | ICD-10-CM

## 2022-09-19 DIAGNOSIS — I10 Essential (primary) hypertension: Secondary | ICD-10-CM

## 2022-09-19 MED ORDER — PROAIR RESPICLICK 108 (90 BASE) MCG/ACT IN AEPB: 1.0000 | INHALATION_SPRAY | Freq: Four times a day (QID) | RESPIRATORY_TRACT | 3 refills | Status: DC

## 2022-09-19 NOTE — Patient Instructions (Signed)
Medication Instructions:  Your physician recommends that you continue on your current medications as directed. Please refer to the Current Medication list given to you today.  *If you need a refill on your cardiac medications before your next appointment, please call your pharmacy*   Lab Work: Your physician recommends that you return for lab work in: 3 months for FASTING lipid/liver panel  If you have labs (blood work) drawn today and your tests are completely normal, you will receive your results only by: [place] (if you have MyChart) OR A paper copy in the mail If you have any lab test that is abnormal or we need to change your treatment, we will call you to review the results.   Follow-Up: At [facility], you and your health needs are our priority.  As part of our continuing mission to provide you with exceptional heart care, we have created designated Provider Care Teams.  These Care Teams include your primary Cardiologist (physician) and Advanced Practice Providers (APPs -  Physician Assistants and Nurse Practitioners) who all work together to provide you with the care you need, when you need it.  We recommend signing up for the patient portal called "MyChart".  Sign up information is provided on this After Visit Summary.  MyChart is used to connect with patients for Virtual Visits (Telemedicine).  Patients are able to view lab/test results, encounter notes, upcoming appointments, etc.  Non-urgent messages can be sent to your provider as well.   To learn more about what you can do with MyChart, go to [email or web address].    Your next appointment:   6 month(s)  The format for your next appointment:   In Person  Provider:   Fabian Sharp, PA-C, Sande Rives, PA-C, Jory Sims, DNP, ANP, Almyra Deforest, PA-C, or Diona Browner, NP        Then, Quay Burow, MD will plan to see you again in 12 month(s).

## 2022-09-19 NOTE — Progress Notes (Signed)
09/19/2022 Melissa Pham   [birth year]  [id number]  Primary Physician Melissa Pounds, NP Primary Cardiologist: Lorretta Harp MD Melissa Pham, [place]  HPI:  Melissa Pham is a 56 y.o. moderately overweight single African-American female mother of 1 living child (7 deceased), grandmother of 6 grandchildren who is 100% disabled because of schizophrenia, bipolar disorder and PTSD.  She was referred by Melissa Rankins, NP to be established because of cardiac risk factors.  She does have a history of ongoing tobacco abuse, treated hypertension, diabetes and hyperlipidemia.  Her father had bypass surgery.  She is never had a heart attack or stroke.  She did have a coronary CTA performed 12/12/2017 revealing a coronary calcium score of 93 with nonobstructive CAD.  A subsequent Myoview stress test performed 12/31/2018 was nonischemic.  She does have normal LV function.  An event monitor showed no arrhythmias.  She works out at [employer], blocks at [employer] and is taking Engineering geologist.  She also takes some Zumba classes.   Current Meds  Medication Sig   Accu-Chek Softclix Lancets lancets Use as instructed. Check blood glucose level by fingerstick 2 times per day.   albuterol (PROVENTIL) (2.5 MG/3ML) 0.083% nebulizer solution Take 3 mLs (2.5 mg total) by nebulization every 6 (six) hours as needed for wheezing or shortness of breath.   albuterol (VENTOLIN HFA) 108 (90 Base) MCG/ACT inhaler Inhale 1-2 puffs into the lungs every 6 (six) hours as needed for wheezing or shortness of breath.   aspirin EC 81 MG tablet Take 81 mg by mouth daily.   budesonide-formoterol (SYMBICORT) 160-4.5 MCG/ACT inhaler Inhale 2 puffs into the lungs once for 1 dose.   busPIRone (BUSPAR) 7.5 MG tablet Take 1 tablet (7.5 mg total) by mouth 2 (two) times daily. For anxiety   diclofenac Sodium (VOLTAREN) 1 % GEL Apply 4 g topically 4 (four) times daily.   dicyclomine (BENTYL) 20 MG tablet Take 1 tablet (20 mg total) by  mouth 2 (two) times daily.   gabapentin (NEURONTIN) 300 MG capsule Take 1 capsule (300 mg total) by mouth at bedtime.   glimepiride (AMARYL) 2 MG tablet Take 1 tablet (2 mg total) by mouth daily with breakfast.   glucose blood (ACCU-CHEK GUIDE) test strip Use as instructed. Check blood glucose by fingerstick twice per day.   lisinopril (ZESTRIL) 5 MG tablet Take 1 tablet (5 mg total) by mouth daily.   metoprolol succinate (TOPROL-XL) 50 MG 24 hr tablet Take 1 tablet (50 mg total) by mouth daily. Take with or immediately following a meal.   Multiple Vitamins-Minerals (MULTIVITAMIN WITH MINERALS) tablet Take 1 tablet by mouth daily.   pantoprazole (PROTONIX) 40 MG tablet Take 1 tablet (40 mg total) by mouth daily.   PARoxetine (PAXIL) 20 MG tablet Take 1 tablet (20 mg total) by mouth daily.   simvastatin (ZOCOR) 40 MG tablet Take 1 tablet (40 mg total) by mouth daily.   sucralfate (CARAFATE) 1 g tablet Take 1 tablet (1 g total) by mouth 2 (two) times daily.     Allergies  Allergen Reactions   Sulfa Antibiotics Anaphylaxis    Social History   Socioeconomic History   Marital status: Single    Spouse name: Not on file   Number of children: Not on file   Years of education: Not on file   Highest education level: Not on file  Occupational History   Not on file  Tobacco Use   Smoking status: Every Day  Packs/day: 0.25    Years: 4.00    Total pack years: 1.00    Types: Cigarettes   Smokeless tobacco: Never  Vaping Use   Vaping Use: Never used  Substance and Sexual Activity   Alcohol use: No    Alcohol/week: 0.0 standard drinks of alcohol   Drug use: No    Comment: no drug use for 7 years   Sexual activity: Not Currently    Birth control/protection: Surgical  Other Topics Concern   Not on file  Social History Narrative   Not on file   Social Determinants of Health   Financial Resource Strain: Not on file  Food Insecurity: Not on file  Transportation Needs: Not on file   Physical Activity: Not on file  Stress: Not on file  Social Connections: Not on file  Intimate Partner Violence: Not on file     Review of Systems: General: negative for chills, fever, night sweats or weight changes.  Cardiovascular: negative for chest pain, dyspnea on exertion, edema, orthopnea, palpitations, paroxysmal nocturnal dyspnea or shortness of breath Dermatological: negative for rash Respiratory: negative for cough or wheezing Urologic: negative for hematuria Abdominal: negative for nausea, vomiting, diarrhea, bright red blood per rectum, melena, or hematemesis Neurologic: negative for visual changes, syncope, or dizziness All other systems reviewed and are otherwise negative except as noted above.    Blood pressure 126/72, pulse 80, height '5\' 1"'$  (1.549 m), weight 193 lb 12.8 oz (87.9 kg), last menstrual period 07/21/2013, SpO2 94 %.  General appearance: alert and no distress Neck: no adenopathy, no carotid bruit, no JVD, supple, symmetrical, trachea midline, and thyroid not enlarged, symmetric, no tenderness/mass/nodules Lungs: clear to auscultation bilaterally Heart: regular rate and rhythm, S1, S2 normal, no murmur, click, rub or gallop Extremities: extremities normal, atraumatic, no cyanosis or edema Pulses: 2+ and symmetric Skin: Skin color, texture, turgor normal. No rashes or lesions Neurologic: Grossly normal  EKG sinus rhythm at 80 without ST or T wave changes.  I personally reviewed this EKG.  ASSESSMENT AND PLAN:   HTN (hypertension) History of essential hypertension blood pressure measured today at 126/72.  She is on metoprolol and lisinopril.  Dyslipidemia History of dyslipidemia on simvastatin which has not taken for a while.  We will recheck a lipid liver profile in 3 months after she refills her prescription  Tobacco abuse Ongoing tobacco abuse with 30 pack a day having smoked for last 40 years.  She is recalcitrant to risk factor  modification.  CAD in native artery Coronary CTA performed 12/12/2017 revealed a coronary calcium score of 93 with nonobstructive CAD.  She did have a negative Myoview stress test performed 12/24/2018.  She gets occasional atypical chest pain.     Lorretta Harp MD FACP,FACC,FAHA, [facility] 09/19/2022 11:51 AM

## 2022-09-19 NOTE — Assessment & Plan Note (Signed)
History of dyslipidemia on simvastatin which has not taken for a while.  We will recheck a lipid liver profile in 3 months after she refills her prescription

## 2022-09-19 NOTE — Assessment & Plan Note (Signed)
History of essential hypertension blood pressure measured today at 126/72.  She is on metoprolol and lisinopril.

## 2022-09-19 NOTE — Assessment & Plan Note (Signed)
Coronary CTA performed 12/12/2017 revealed a coronary calcium score of 93 with nonobstructive CAD.  She did have a negative Myoview stress test performed 12/24/2018.  She gets occasional atypical chest pain.

## 2022-09-19 NOTE — Assessment & Plan Note (Signed)
Ongoing tobacco abuse with 30 pack a day having smoked for last 40 years.  She is recalcitrant to risk factor modification.

## 2022-10-01 ENCOUNTER — Ambulatory Visit: Payer: Medicare Other | Admitting: Internal Medicine

## 2022-10-09 DIAGNOSIS — K648 Other hemorrhoids: Secondary | ICD-10-CM | POA: Diagnosis not present

## 2022-10-29 ENCOUNTER — Ambulatory Visit: Payer: Medicare Other | Attending: Nurse Practitioner | Admitting: Pharmacist

## 2022-10-29 DIAGNOSIS — Z7189 Other specified counseling: Secondary | ICD-10-CM

## 2022-10-29 NOTE — Progress Notes (Signed)
   Patient was educated on the use of the Accu Chek blood glucose meter. Reviewed necessary supplies and operation of the meter. Also reviewed goal blood glucose levels. Patient was able to demonstrate use. All questions and concerns were addressed.  Time spent face-to-face counseling: 15 minutes  Follow-up: 11/22/22 with PCP  Benard Halsted, PharmD, BCACP, [place] [phone]

## 2022-11-11 ENCOUNTER — Encounter: Payer: Self-pay | Admitting: Internal Medicine

## 2022-11-11 ENCOUNTER — Ambulatory Visit (INDEPENDENT_AMBULATORY_CARE_PROVIDER_SITE_OTHER): Payer: Medicare Other | Admitting: Internal Medicine

## 2022-11-11 DIAGNOSIS — R1310 Dysphagia, unspecified: Secondary | ICD-10-CM

## 2022-11-11 DIAGNOSIS — K219 Gastro-esophageal reflux disease without esophagitis: Secondary | ICD-10-CM

## 2022-11-11 DIAGNOSIS — R195 Other fecal abnormalities: Secondary | ICD-10-CM

## 2022-11-11 DIAGNOSIS — Z8719 Personal history of other diseases of the digestive system: Secondary | ICD-10-CM

## 2022-11-11 MED ORDER — NA SULFATE-K SULFATE-MG SULF 17.5-3.13-1.6 GM/177ML PO SOLN: 1.0000 | ORAL | 0 refills | Status: DC

## 2022-11-11 NOTE — Patient Instructions (Addendum)
If you are age 59 or younger, your body mass index should be between 19-25. Your Body mass index is 35.74 kg/m. If this is out of the aformentioned range listed, please consider follow up with your Primary Care Provider.  ________________________________________________________  The [facility] providers would like to encourage you to use [facility] to communicate with providers for non-urgent requests or questions.  Due to long hold times on the telephone, sending your provider a message by [facility] may be a faster and more efficient way to get a response.  Please allow 48 business hours for a response.  Please remember that this is for non-urgent requests.  _______________________________________________________  Please use lactaid when consuming cheese, or avoid cheese if possible.   You have been scheduled for an endoscopy and colonoscopy. Please follow the written instructions given to you at your visit today. Please pick up your prep supplies at the pharmacy within the next 1-3 days. If you use inhalers (even only as needed), please bring them with you on the day of your procedure.  Due to recent changes in healthcare laws, you may see the results of your imaging and laboratory studies on MyChart before your provider has had a chance to review them.  We understand that in some cases there may be results that are confusing or concerning to you. Not all laboratory results come back in the same time frame and the provider may be waiting for multiple results in order to interpret others.  Please give [place] 48 hours in order for your provider to thoroughly review all the results before contacting the office for clarification of your results.   Thank you for entrusting me with your care and choosing [facility].  Dr Lorenso Courier

## 2022-11-11 NOTE — Progress Notes (Signed)
Chief Complaint: Diverticulitis  HPI : 57 year old female with history of DM, CAD, bipolar disorder, asthma, schizophrenia, GERD and ADHD presents with diverticulitis  She was diagnosed with diverticulitis in 07/2022 after she went to the ED and had a CT scan that showed early diverticulitis. She has had benefit in her GI symptoms from taking Benefiber, detox tea, and lemon use with water. Denies ab pain currently. She has frequent BMs. She has more BMs if she gets nervous. Endorses a little bit of nausea. Denies vomiting. She uses Gas-X, Mylanta, and/oror Pepto Bismol PRN. She drinks ginger ale for nausea. Endorses some dysphagia. When she swallows, she will have pain and feel dysphagia to both solids and liquids in her throat. She coughs alls the time, even when she is not eating. She smokes but has been trying quit (has not smoked in the past 4 days). Denies hematochezia or melena. She has had rectal bleeding in the past that has improved. She has GERD. Last colonoscopy was about 8 years ago that was normal. Denies prior EGD. Aunts had colon cancer. Nephew and sister had pancreatic cancer. She avoid dairy such a milk or ice cream because she is lactose intolerance. However, she does eat a lot of cheese. She is under a fair amount of stress. She is raising her grandson after her daughter got killed.   Wt Readings from Last 3 Encounters:  11/11/22 183 lb (83 kg)  09/19/22 193 lb 12.8 oz (87.9 kg)  09/17/22 191 lb 9.6 oz (86.9 kg)    Past Medical History:  Diagnosis Date   ADHD (attention deficit hyperactivity disorder)    Anxiety    Asthma    Bipolar disorder ([place])    Blood transfusion without reported diagnosis 11/05/1983   after childbirth   Coronary artery disease    Depression    Diabetes mellitus without complication (HCC)    GERD (gastroesophageal reflux disease)    History of illicit drug use    Hyperlipidemia    Hypertension    Schizophrenia ([place])    Sickle cell trait ([place])     Tobacco use    Past Surgical History:  Procedure Laterality Date   CERVICAL BIOPSY  W/ LOOP ELECTRODE EXCISION     left eye surgery  [employer]   Family History  Problem Relation Age of Onset   Hyperlipidemia Mother    Arthritis Mother    Parkinson's disease Mother    Heart attack Father    Diabetes Father    Hypertension Father    Hyperlipidemia Father    Cancer Sister        cervical   Breast cancer Sister 73   Breast cancer Sister    Colon cancer Maternal Grandmother 97   Breast cancer Paternal Aunt    Kidney disease Other    Stroke Other    Pancreatic disease Other    Sickle cell anemia Other    Esophageal cancer Neg Hx    Rectal cancer Neg Hx    Social History   Tobacco Use   Smoking status: Every Day    Packs/day: 0.25    Years: 4.00    Total pack years: 1.00    Types: Cigarettes   Smokeless tobacco: Never  Vaping Use   Vaping Use: Never used  Substance Use Topics   Alcohol use: No    Alcohol/week: 0.0 standard drinks of alcohol   Drug use: No    Comment: no drug  use for 7 years   Current Outpatient Medications  Medication Sig Dispense Refill   Accu-Chek Softclix Lancets lancets Use as instructed. Check blood glucose level by fingerstick 2 times per day. 100 each 12   albuterol (PROVENTIL) (2.5 MG/3ML) 0.083% nebulizer solution Take 3 mLs (2.5 mg total) by nebulization every 6 (six) hours as needed for wheezing or shortness of breath. 75 mL 12   Albuterol Sulfate (PROAIR RESPICLICK) 010 (90 Base) MCG/ACT AEPB Inhale 1 puff into the lungs every 6 (six) hours. 1 each 3   aspirin EC 81 MG tablet Take 81 mg by mouth daily.     busPIRone (BUSPAR) 7.5 MG tablet Take 1 tablet (7.5 mg total) by mouth 2 (two) times daily. For anxiety 60 tablet 0   glimepiride (AMARYL) 2 MG tablet Take 1 tablet (2 mg total) by mouth daily with breakfast. 90 tablet 1   lisinopril (ZESTRIL) 5 MG tablet Take 1 tablet (5 mg total) by mouth daily. 90 tablet 1    MELOXICAM PO Take by mouth.     metoprolol succinate (TOPROL-XL) 50 MG 24 hr tablet Take 1 tablet (50 mg total) by mouth daily. Take with or immediately following a meal. 90 tablet 1   Na Sulfate-K Sulfate-Mg Sulf (SUPREP BOWEL PREP KIT) 17.5-3.13-1.6 GM/177ML SOLN Take 1 kit by mouth as directed. 324 mL 0   pantoprazole (PROTONIX) 40 MG tablet Take 1 tablet (40 mg total) by mouth daily. 90 tablet 1   PARoxetine (PAXIL) 20 MG tablet Take 1 tablet (20 mg total) by mouth daily. 90 tablet 1   simvastatin (ZOCOR) 40 MG tablet Take 1 tablet (40 mg total) by mouth daily. 90 tablet 1   valACYclovir (VALTREX) 1000 MG tablet Take 1 tablet (1,000 mg total) by mouth 3 (three) times daily. 21 tablet 3   budesonide-formoterol (SYMBICORT) 160-4.5 MCG/ACT inhaler Inhale 2 puffs into the lungs once for 1 dose. 1 each 12   clotrimazole (MYCELEX) 10 MG troche Take 1 tablet (10 mg total) by mouth 5 (five) times daily. (Patient not taking: Reported on 04/09/2022) 70 Troche 0   diclofenac Sodium (VOLTAREN) 1 % GEL Apply 4 g topically 4 (four) times daily. (Patient not taking: Reported on 11/11/2022) 100 g 2   dicyclomine (BENTYL) 20 MG tablet Take 1 tablet (20 mg total) by mouth 2 (two) times daily. (Patient not taking: Reported on 11/11/2022) 20 tablet 0   gabapentin (NEURONTIN) 300 MG capsule Take 1 capsule (300 mg total) by mouth at bedtime. (Patient not taking: Reported on 11/11/2022) 90 capsule 1   glucose blood (ACCU-CHEK GUIDE) test strip Use as instructed. Check blood glucose by fingerstick twice per day. (Patient not taking: Reported on 11/11/2022) 100 each 12   hydrocortisone (ANUSOL-HC) 2.5 % rectal cream Place 1 Application rectally 2 (two) times daily. (Patient not taking: Reported on 09/17/2022) 30 g 0   ibuprofen (ADVIL) 400 MG tablet Take 1 tablet (400 mg total) by mouth every 6 (six) hours as needed for moderate pain. (Patient not taking: Reported on 09/19/2022) 30 tablet 0   Lidocaine-Glycerin (PREPARATION H RE)  Place 1 application rectally as needed (hemmroids). (Patient not taking: Reported on 09/17/2022)     Multiple Vitamins-Minerals (MULTIVITAMIN WITH MINERALS) tablet Take 1 tablet by mouth daily. (Patient not taking: Reported on 11/11/2022)     nitroGLYCERIN (NITROSTAT) 0.4 MG SL tablet Place 0.4 mg under the tongue every 5 (five) minutes as needed for chest pain. (Patient not taking: Reported on 09/19/2022)  sucralfate (CARAFATE) 1 g tablet Take 1 tablet (1 g total) by mouth 2 (two) times daily. (Patient not taking: Reported on 11/11/2022) 30 tablet 0   No current facility-administered medications for this visit.   Allergies  Allergen Reactions   Sulfa Antibiotics Anaphylaxis     Review of Systems: All systems reviewed and negative except where noted in HPI.   Physical Exam: BP 100/68   Pulse 72   Ht 5' (1.524 m)   Wt 183 lb (83 kg)   LMP 07/21/2013   BMI 35.74 kg/m  Constitutional: Pleasant,well-developed, female in no acute distress. HEENT: Normocephalic and atraumatic. Conjunctivae are normal. No scleral icterus. Cardiovascular: Normal rate, regular rhythm.  Pulmonary/chest: Effort normal and breath sounds normal. No wheezing, rales or rhonchi. Abdominal: Soft, nondistended, mildly tender in the LLQ. Bowel sounds active throughout. There are no masses palpable. No hepatomegaly. Extremities: No edema Neurological: Alert and oriented to person place and time. Skin: Skin is warm and dry. No rashes noted. Psychiatric: Normal mood and affect. Behavior is normal.  Labs 07/2022: CBC nml. CMP with low albumin of 3.3 and mildly low K of 3.4. Lipase nml.   Labs 08/2022: CBC and CMP unremarkable. GI path panel negative.   Labs 09/2022: TSH nml. Free T4 nml.   CT A/P w/contrast 07/30/22: IMPRESSION: Sigmoid diverticulosis. Subtle/slight haziness around the sigmoid colon could reflect early acute diverticulitis  ASSESSMENT AND PLAN: History of diverticulitis Dysphagia GERD Loose  stools Patient presents with a recent bout of presumed diverticulitis in 07/2022. Her last colonoscopy was about 8 years ago that was reportedly normal. Thus will get her set up for another colonoscopy to rule out underlying colon cancer. Patient also describes dysphagia so will plan for an EGD at the same time as her colonoscopy. Her loose stools may be due to lactose intolerance. - Try to avoid eating cheese or take Lactaid while eating lactose - EGD/colonoscopy [facility]  Christia Reading, MD  I spent 60 minutes of time, including in depth chart review, independent review of results as outlined above, communicating results with the patient directly, face-to-face time with the patient, coordinating care, ordering studies and medications as appropriate, and documentation.

## 2022-11-20 ENCOUNTER — Encounter: Payer: Self-pay | Admitting: General Practice

## 2022-11-22 ENCOUNTER — Other Ambulatory Visit (HOSPITAL_COMMUNITY)
Admission: RE | Admit: 2022-11-22 | Discharge: 2022-11-22 | Disposition: A | Discharge status: Home / Self Care | Payer: 59 | Source: Ambulatory Visit | Attending: Nurse Practitioner | Admitting: Nurse Practitioner

## 2022-11-22 ENCOUNTER — Encounter: Payer: Self-pay | Admitting: Nurse Practitioner

## 2022-11-22 ENCOUNTER — Ambulatory Visit: Payer: 59 | Attending: Nurse Practitioner | Admitting: Nurse Practitioner

## 2022-11-22 VITALS — BP 138/78 | HR 72 | Ht 60.0 in | Wt 174.6 lb

## 2022-11-22 DIAGNOSIS — D649 Anemia, unspecified: Secondary | ICD-10-CM | POA: Diagnosis not present

## 2022-11-22 DIAGNOSIS — E7800 Pure hypercholesterolemia, unspecified: Secondary | ICD-10-CM | POA: Diagnosis not present

## 2022-11-22 DIAGNOSIS — Z1231 Encounter for screening mammogram for malignant neoplasm of breast: Secondary | ICD-10-CM

## 2022-11-22 DIAGNOSIS — E1165 Type 2 diabetes mellitus with hyperglycemia: Secondary | ICD-10-CM | POA: Diagnosis not present

## 2022-11-22 DIAGNOSIS — Z124 Encounter for screening for malignant neoplasm of cervix: Secondary | ICD-10-CM | POA: Diagnosis not present

## 2022-11-22 DIAGNOSIS — Z1151 Encounter for screening for human papillomavirus (HPV): Secondary | ICD-10-CM | POA: Insufficient documentation

## 2022-11-22 DIAGNOSIS — Z01419 Encounter for gynecological examination (general) (routine) without abnormal findings: Secondary | ICD-10-CM | POA: Diagnosis present

## 2022-11-22 NOTE — Progress Notes (Signed)
Assessment & Plan:  Kalisa was seen today for gynecologic exam.  Diagnoses and all orders for this visit:  Encounter for Papanicolaou smear for cervical cancer screening -     Cervicovaginal ancillary only -     Cytology - PAP  Breast cancer screening by mammogram -     MM 3D SCREEN BREAST BILATERAL; Future  Type 2 diabetes mellitus with hyperglycemia, without long-term current use of insulin [facility]) Pharmacist was available to stop by today for hands on teaching on how to use her glucometer. He has shown her in the past as well however she states she has dyslexia and has a hard time remembering -     Microalbumin / creatinine urine ratio -     Hemoglobin A1c  Anemia, unspecified type -     CBC with Differential  Pure hypercholesterolemia -     Lipid panel    Patient has been counseled on age-appropriate routine health concerns for screening and prevention. These are reviewed and up-to-date. Referrals have been placed accordingly. Immunizations are up-to-date or declined.    Subjective:   Chief Complaint  Patient presents with   Gynecologic Exam   HPI Melissa Pham 57 y.o. female presents to office today for PAP smear.  Patient has been counseled on age-appropriate routine health concerns for screening and prevention. These are reviewed and up-to-date. Referrals have been placed accordingly. Immunizations are up-to-date or declined.     MAMMOGRAM: UTD  COLONOSCOPY: OVERDUE. Referral placed.         Review of Systems  Constitutional: Negative.  Negative for chills, fever, malaise/fatigue and weight loss.  Respiratory: Negative.  Negative for cough, shortness of breath and wheezing.   Cardiovascular: Negative.  Negative for chest pain, orthopnea and leg swelling.  Gastrointestinal:  Negative for abdominal pain.  Genitourinary: Negative.  Negative for flank pain.  Skin: Negative.  Negative for rash.  Psychiatric/Behavioral:  Negative for suicidal ideas.      Past Medical History:  Diagnosis Date   ADHD (attention deficit hyperactivity disorder)    Anxiety    Asthma    Bipolar disorder ([place])    Blood transfusion without reported diagnosis 11/05/1983   after childbirth   Coronary artery disease    Depression    Diabetes mellitus without complication (HCC)    GERD (gastroesophageal reflux disease)    History of illicit drug use    Hyperlipidemia    Hypertension    Schizophrenia ([place])    Sickle cell trait ([place])    Tobacco use     Past Surgical History:  Procedure Laterality Date   CERVICAL BIOPSY  W/ LOOP ELECTRODE EXCISION     left eye surgery  [place]    Family History  Problem Relation Age of Onset   Hyperlipidemia Mother    Arthritis Mother    Parkinson's disease Mother    Heart attack Father    Diabetes Father    Hypertension Father    Hyperlipidemia Father    Cancer Sister        cervical   Breast cancer Sister 46   Breast cancer Sister    Colon cancer Maternal Grandmother 97   Breast cancer Paternal Aunt    Kidney disease Other    Stroke Other    Pancreatic disease Other    Sickle cell anemia Other    Esophageal cancer Neg Hx    Rectal cancer Neg Hx     Social History Reviewed with  no changes to be made today.   Outpatient Medications Prior to Visit  Medication Sig Dispense Refill   albuterol (PROVENTIL) (2.5 MG/3ML) 0.083% nebulizer solution Take 3 mLs (2.5 mg total) by nebulization every 6 (six) hours as needed for wheezing or shortness of breath. 75 mL 12   Albuterol Sulfate (PROAIR RESPICLICK) 824 (90 Base) MCG/ACT AEPB Inhale 1 puff into the lungs every 6 (six) hours. 1 each 3   aspirin EC 81 MG tablet Take 81 mg by mouth daily.     busPIRone (BUSPAR) 7.5 MG tablet Take 1 tablet (7.5 mg total) by mouth 2 (two) times daily. For anxiety 60 tablet 0   diclofenac Sodium (VOLTAREN) 1 % GEL Apply 4 g topically 4 (four) times daily. 100 g 2   gabapentin (NEURONTIN) 300 MG capsule Take  1 capsule (300 mg total) by mouth at bedtime. 90 capsule 1   glimepiride (AMARYL) 2 MG tablet Take 1 tablet (2 mg total) by mouth daily with breakfast. 90 tablet 1   lisinopril (ZESTRIL) 5 MG tablet Take 1 tablet (5 mg total) by mouth daily. 90 tablet 1   MELOXICAM PO Take by mouth.     metoprolol succinate (TOPROL-XL) 50 MG 24 hr tablet Take 1 tablet (50 mg total) by mouth daily. Take with or immediately following a meal. 90 tablet 1   Na Sulfate-K Sulfate-Mg Sulf (SUPREP BOWEL PREP KIT) 17.5-3.13-1.6 GM/177ML SOLN Take 1 kit by mouth as directed. 324 mL 0   pantoprazole (PROTONIX) 40 MG tablet Take 1 tablet (40 mg total) by mouth daily. 90 tablet 1   PARoxetine (PAXIL) 20 MG tablet Take 1 tablet (20 mg total) by mouth daily. 90 tablet 1   simvastatin (ZOCOR) 40 MG tablet Take 1 tablet (40 mg total) by mouth daily. 90 tablet 1   Accu-Chek Softclix Lancets lancets Use as instructed. Check blood glucose level by fingerstick 2 times per day. (Patient not taking: Reported on 11/22/2022) 100 each 12   budesonide-formoterol (SYMBICORT) 160-4.5 MCG/ACT inhaler Inhale 2 puffs into the lungs once for 1 dose. 1 each 12   dicyclomine (BENTYL) 20 MG tablet Take 1 tablet (20 mg total) by mouth 2 (two) times daily. (Patient not taking: Reported on 11/11/2022) 20 tablet 0   glucose blood (ACCU-CHEK GUIDE) test strip Use as instructed. Check blood glucose by fingerstick twice per day. (Patient not taking: Reported on 11/11/2022) 100 each 12   hydrocortisone (ANUSOL-HC) 2.5 % rectal cream Place 1 Application rectally 2 (two) times daily. (Patient not taking: Reported on 09/17/2022) 30 g 0   ibuprofen (ADVIL) 400 MG tablet Take 1 tablet (400 mg total) by mouth every 6 (six) hours as needed for moderate pain. (Patient not taking: Reported on 09/19/2022) 30 tablet 0   Lidocaine-Glycerin (PREPARATION H RE) Place 1 application rectally as needed (hemmroids). (Patient not taking: Reported on 09/17/2022)     Multiple  Vitamins-Minerals (MULTIVITAMIN WITH MINERALS) tablet Take 1 tablet by mouth daily. (Patient not taking: Reported on 11/11/2022)     nitroGLYCERIN (NITROSTAT) 0.4 MG SL tablet Place 0.4 mg under the tongue every 5 (five) minutes as needed for chest pain. (Patient not taking: Reported on 09/19/2022)     sucralfate (CARAFATE) 1 g tablet Take 1 tablet (1 g total) by mouth 2 (two) times daily. (Patient not taking: Reported on 11/11/2022) 30 tablet 0   valACYclovir (VALTREX) 1000 MG tablet Take 1 tablet (1,000 mg total) by mouth 3 (three) times daily. (Patient not taking: Reported on  11/22/2022) 21 tablet 3   clotrimazole (MYCELEX) 10 MG troche Take 1 tablet (10 mg total) by mouth 5 (five) times daily. (Patient not taking: Reported on 04/09/2022) 70 Troche 0   No facility-administered medications prior to visit.    Allergies  Allergen Reactions   Sulfa Antibiotics Anaphylaxis       Objective:    BP 138/78   Pulse 72   Ht 5' (1.524 m)   Wt 174 lb 9.6 oz (79.2 kg)   LMP 07/21/2013   SpO2 98%   BMI 34.10 kg/m  Wt Readings from Last 3 Encounters:  11/22/22 174 lb 9.6 oz (79.2 kg)  11/11/22 183 lb (83 kg)  09/19/22 193 lb 12.8 oz (87.9 kg)    Physical Exam Exam conducted with a chaperone present.  Constitutional:      Appearance: She is well-developed.  HENT:     Head: Normocephalic.  Cardiovascular:     Rate and Rhythm: Normal rate and regular rhythm.     Heart sounds: Normal heart sounds.  Pulmonary:     Effort: Pulmonary effort is normal.     Breath sounds: Normal breath sounds.  Abdominal:     General: Bowel sounds are normal.     Palpations: Abdomen is soft.     Hernia: There is no hernia in the left inguinal area.  Genitourinary:    Exam position: Lithotomy position.     Labia:        Right: No rash, tenderness, lesion or injury.        Left: No rash, tenderness, lesion or injury.      Vagina: Normal. No signs of injury and foreign body. No vaginal discharge, erythema,  tenderness or bleeding.     Cervix: Normal.     Uterus: Not deviated and not enlarged.      Adnexa:        Right: No mass, tenderness or fullness.         Left: No mass, tenderness or fullness.       Rectum: Normal. No external hemorrhoid.  Lymphadenopathy:     Lower Body: No right inguinal adenopathy. No left inguinal adenopathy.  Skin:    General: Skin is warm and dry.  Neurological:     Mental Status: She is alert and oriented to person, place, and time.  Psychiatric:        Behavior: Behavior normal.        Thought Content: Thought content normal.        Judgment: Judgment normal.          Patient has been counseled extensively about nutrition and exercise as well as the importance of adherence with medications and regular follow-up. The patient was given clear instructions to go to ER or return to medical center if symptoms don't improve, worsen or new problems develop. The patient verbalized understanding.   Follow-up: Return in about 6 months (around 05/23/2023).   Gildardo Pounds, FNP-BC [facility], [place]   11/22/2022, 12:39 PM

## 2022-11-23 LAB — CBC WITH DIFFERENTIAL/PLATELET
Basophils Absolute: 0 10*3/uL (ref 0.0–0.2)
Basos: 0 %
EOS (ABSOLUTE): 0.3 10*3/uL (ref 0.0–0.4)
Eos: 4 %
Hematocrit: 37.7 % (ref 34.0–46.6)
Hemoglobin: 12.7 g/dL (ref 11.1–15.9)
Immature Grans (Abs): 0 10*3/uL (ref 0.0–0.1)
Immature Granulocytes: 0 %
Lymphocytes Absolute: 2.1 10*3/uL (ref 0.7–3.1)
Lymphs: 34 %
MCH: 28.7 pg (ref 26.6–33.0)
MCHC: 33.7 g/dL (ref 31.5–35.7)
MCV: 85 fL (ref 79–97)
Monocytes Absolute: 0.6 10*3/uL (ref 0.1–0.9)
Monocytes: 10 %
Neutrophils Absolute: 3.2 10*3/uL (ref 1.4–7.0)
Neutrophils: 52 %
Platelets: 346 10*3/uL (ref 150–450)
RBC: 4.43 x10E6/uL (ref 3.77–5.28)
RDW: 14.1 % (ref 11.7–15.4)
WBC: 6.1 10*3/uL (ref 3.4–10.8)

## 2022-11-23 LAB — HEMOGLOBIN A1C
Est. average glucose Bld gHb Est-mCnc: 128 mg/dL
Hgb A1c MFr Bld: 6.1 % — ABNORMAL HIGH (ref 4.8–5.6)

## 2022-11-23 LAB — MICROALBUMIN / CREATININE URINE RATIO
Creatinine, Urine: 365.9 mg/dL
Microalb/Creat Ratio: 27 mg/g creat (ref 0–29)
Microalbumin, Urine: 100.2 ug/mL

## 2022-11-23 LAB — LIPID PANEL
Chol/HDL Ratio: 5.3 ratio — ABNORMAL HIGH (ref 0.0–4.4)
Cholesterol, Total: 235 mg/dL — ABNORMAL HIGH (ref 100–199)
HDL: 44 mg/dL (ref >39)
LDL Chol Calc (NIH): 161 mg/dL — ABNORMAL HIGH (ref 0–99)
Triglycerides: 165 mg/dL — ABNORMAL HIGH (ref 0–149)
VLDL Cholesterol Cal: 30 mg/dL (ref 5–40)

## 2022-11-25 ENCOUNTER — Other Ambulatory Visit: Payer: Self-pay | Admitting: Nurse Practitioner

## 2022-11-25 DIAGNOSIS — B9689 Other specified bacterial agents as the cause of diseases classified elsewhere: Secondary | ICD-10-CM

## 2022-11-25 LAB — CERVICOVAGINAL ANCILLARY ONLY
Bacterial Vaginitis (gardnerella): POSITIVE — AB
Candida Glabrata: NEGATIVE
Candida Vaginitis: NEGATIVE
Chlamydia: NEGATIVE
Comment: NEGATIVE
Comment: NEGATIVE
Comment: NEGATIVE
Comment: NEGATIVE
Comment: NEGATIVE
Comment: NORMAL
Neisseria Gonorrhea: NEGATIVE
Trichomonas: NEGATIVE

## 2022-11-25 MED ORDER — METRONIDAZOLE 500 MG PO TABS: 500.0000 mg | ORAL_TABLET | Freq: Two times a day (BID) | ORAL | 0 refills | Status: AC

## 2022-11-28 LAB — CYTOLOGY - PAP
Adequacy: ABSENT
Comment: NEGATIVE
Diagnosis: NEGATIVE
High risk HPV: NEGATIVE

## 2022-12-04 ENCOUNTER — Ambulatory Visit (HOSPITAL_BASED_OUTPATIENT_CLINIC_OR_DEPARTMENT_OTHER): Payer: 59 | Admitting: Psychiatry

## 2022-12-04 ENCOUNTER — Encounter (HOSPITAL_COMMUNITY): Payer: Self-pay | Admitting: Psychiatry

## 2022-12-04 VITALS — Wt 174.0 lb

## 2022-12-04 DIAGNOSIS — F319 Bipolar disorder, unspecified: Secondary | ICD-10-CM | POA: Diagnosis not present

## 2022-12-04 DIAGNOSIS — T1490XA Injury, unspecified, initial encounter: Secondary | ICD-10-CM
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
## 2022-12-16 ENCOUNTER — Telehealth: Payer: Self-pay | Admitting: Internal Medicine

## 2022-12-16 ENCOUNTER — Encounter: Payer: 59 | Admitting: Internal Medicine

## 2022-12-16 NOTE — Telephone Encounter (Signed)
Hi Dr Lorenso Courier,   I called patient regarding her procedure appointment today, patient did not answer, phone went straight to voice mail. I left a voice message for patient but patient did not return the call. I will no show patient for today's appointment.   Thank you

## 2022-12-18 ENCOUNTER — Ambulatory Visit: Payer: Self-pay | Admitting: Nurse Practitioner

## 2022-12-24 ENCOUNTER — Inpatient Hospital Stay: Payer: 59 | Admitting: Genetic Counselor

## 2022-12-24 ENCOUNTER — Other Ambulatory Visit: Payer: Self-pay

## 2022-12-24 ENCOUNTER — Ambulatory Visit (INDEPENDENT_AMBULATORY_CARE_PROVIDER_SITE_OTHER): Payer: 59

## 2022-12-24 ENCOUNTER — Ambulatory Visit (HOSPITAL_COMMUNITY)
Admission: EM | Admit: 2022-12-24 | Discharge: 2022-12-24 | Disposition: A | Discharge status: Home / Self Care | Payer: 59 | Attending: Family Medicine | Admitting: Family Medicine

## 2022-12-24 ENCOUNTER — Inpatient Hospital Stay: Payer: 59

## 2022-12-24 ENCOUNTER — Encounter (HOSPITAL_COMMUNITY): Payer: Self-pay | Admitting: Emergency Medicine

## 2022-12-24 DIAGNOSIS — M79642 Pain in left hand: Secondary | ICD-10-CM | POA: Diagnosis not present

## 2022-12-24 DIAGNOSIS — M79645 Pain in left finger(s): Secondary | ICD-10-CM

## 2022-12-24 DIAGNOSIS — M19042 Primary osteoarthritis, left hand: Secondary | ICD-10-CM | POA: Diagnosis not present

## 2022-12-24 DIAGNOSIS — W19XXXA Unspecified fall, initial encounter: Secondary | ICD-10-CM

## 2022-12-24 DIAGNOSIS — S6992XA Unspecified injury of left wrist, hand and finger(s), initial encounter: Secondary | ICD-10-CM | POA: Diagnosis not present

## 2022-12-24 NOTE — Discharge Instructions (Addendum)
You were seen today for hand/finger pain. The xray shows arthritis but nothing broken/fractured.  For now I recommend motrin and ice for pain.   If you continue with pain you may wish to follow up with your primary care provider.
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
## 2022-12-24 NOTE — ED Provider Notes (Signed)
[place]    CSN: [id number] Arrival date & time: 12/24/22  1251      History   Chief Complaint Chief Complaint  Patient presents with   Hand Pain    HPI Melissa Pham is a 57 y.o. female.   Patient is here for left 5th finger pain.  About 2 weeks ago she fell and landed on the back of the hand? Had some pain right after but she wrapped it herself.  She has had continued pain.  Yesterday someone shook her hand and had severe pain.  No current swelling.         Past Medical History:  Diagnosis Date   ADHD (attention deficit hyperactivity disorder)    Anxiety    Asthma    Bipolar disorder ([place])    Blood transfusion without reported diagnosis 11/05/1983   after childbirth   Coronary artery disease    Depression    Diabetes mellitus without complication (HCC)    GERD (gastroesophageal reflux disease)    History of illicit drug use    Hyperlipidemia    Hypertension    Schizophrenia ([place])    Sickle cell trait ([place])    Tobacco use     Patient Active Problem List   Diagnosis Date Noted   Syncope 12/06/2018   CAD in native artery    Tobacco abuse 07/27/2018   Pure hypercholesterolemia    Generalized abdominal discomfort 12/10/2017   Effusion, right knee 03/13/2017   Sprain of right knee 03/13/2017   Chest pain 02/20/2015   Suprapubic pain 02/20/2015   Diarrhea 02/20/2015   Diabetes mellitus, type 2 ([place]) 02/20/2015   HTN (hypertension) 02/20/2015   Dyslipidemia 02/20/2015   Bipolar affective disorder ([place]) 02/20/2015   Schizophrenia ([place]) 02/20/2015   Hypokalemia 02/20/2015    Past Surgical History:  Procedure Laterality Date   CERVICAL BIOPSY  W/ LOOP ELECTRODE EXCISION     left eye surgery  1970   TUBAL LIGATION  1989    OB History     Gravida  7   Para      Term      Preterm      AB  3   Living  4      SAB  1   IAB  2   Ectopic      Multiple      Live Births               Home Medications    Prior to  Admission medications   Medication Sig Start Date End Date Taking? Authorizing Provider  Accu-Chek Softclix Lancets lancets Use as instructed. Check blood glucose level by fingerstick 2 times per day. Patient not taking: Reported on 11/22/2022 09/17/22   Gildardo Pounds, NP  albuterol (PROVENTIL) (2.5 MG/3ML) 0.083% nebulizer solution Take 3 mLs (2.5 mg total) by nebulization every 6 (six) hours as needed for wheezing or shortness of breath. 03/01/22   Talbot Grumbling, FNP  Albuterol Sulfate (PROAIR RESPICLICK) 123XX123 (90 Base) MCG/ACT AEPB Inhale 1 puff into the lungs every 6 (six) hours. 09/19/22   Charlott Rakes, MD  aspirin EC 81 MG tablet Take 81 mg by mouth daily.    [provider]  budesonide-formoterol (SYMBICORT) 160-4.5 MCG/ACT inhaler Inhale 2 puffs into the lungs once for 1 dose. 09/17/22 09/19/22  Gildardo Pounds, NP  busPIRone (BUSPAR) 7.5 MG tablet Take 1 tablet (7.5 mg total) by mouth 2 (two) times daily. For anxiety 09/17/22  Gildardo Pounds, NP  diclofenac Sodium (VOLTAREN) 1 % GEL Apply 4 g topically 4 (four) times daily. 02/23/20   Joy, Shawn C, PA-C  dicyclomine (BENTYL) 20 MG tablet Take 1 tablet (20 mg total) by mouth 2 (two) times daily. Patient not taking: Reported on 11/11/2022 07/30/22   Smoot, Leary Roca, PA-C  gabapentin (NEURONTIN) 300 MG capsule Take 1 capsule (300 mg total) by mouth at bedtime. 09/17/22   Gildardo Pounds, NP  glimepiride (AMARYL) 2 MG tablet Take 1 tablet (2 mg total) by mouth daily with breakfast. 09/17/22   Gildardo Pounds, NP  glucose blood (ACCU-CHEK GUIDE) test strip Use as instructed. Check blood glucose by fingerstick twice per day. Patient not taking: Reported on 11/11/2022 09/17/22   Gildardo Pounds, NP  hydrocortisone (ANUSOL-HC) 2.5 % rectal cream Place 1 Application rectally 2 (two) times daily. Patient not taking: Reported on 09/17/2022 07/29/22   Hans Eden, NP  ibuprofen (ADVIL) 400 MG tablet Take 1 tablet (400 mg  total) by mouth every 6 (six) hours as needed for moderate pain. Patient not taking: Reported on 09/19/2022 05/02/21   Jaynee Eagles, PA-C  Lidocaine-Glycerin (PREPARATION H RE) Place 1 application rectally as needed (hemmroids). Patient not taking: Reported on 09/17/2022    [provider]  lisinopril (ZESTRIL) 5 MG tablet Take 1 tablet (5 mg total) by mouth daily. 09/17/22   Gildardo Pounds, NP  MELOXICAM PO Take by mouth.    [provider]  metoprolol succinate (TOPROL-XL) 50 MG 24 hr tablet Take 1 tablet (50 mg total) by mouth daily. Take with or immediately following a meal. 09/17/22   Gildardo Pounds, NP  Multiple Vitamins-Minerals (MULTIVITAMIN WITH MINERALS) tablet Take 1 tablet by mouth daily. Patient not taking: Reported on 11/11/2022    [provider]  Na Sulfate-K Sulfate-Mg Sulf (SUPREP BOWEL PREP KIT) 17.5-3.13-1.6 GM/177ML SOLN Take 1 kit by mouth as directed. 11/11/22   Sharyn Creamer, MD  nitroGLYCERIN (NITROSTAT) 0.4 MG SL tablet Place 0.4 mg under the tongue every 5 (five) minutes as needed for chest pain. Patient not taking: Reported on 09/19/2022    [provider]  pantoprazole (PROTONIX) 40 MG tablet Take 1 tablet (40 mg total) by mouth daily. 09/17/22   Gildardo Pounds, NP  PARoxetine (PAXIL) 20 MG tablet Take 1 tablet (20 mg total) by mouth daily. 09/17/22   Gildardo Pounds, NP  simvastatin (ZOCOR) 40 MG tablet Take 1 tablet (40 mg total) by mouth daily. 09/17/22   Gildardo Pounds, NP  sucralfate (CARAFATE) 1 g tablet Take 1 tablet (1 g total) by mouth 2 (two) times daily. Patient not taking: Reported on 11/11/2022 03/27/22   Meccariello, Bernita Raisin, MD  valACYclovir (VALTREX) 1000 MG tablet Take 1 tablet (1,000 mg total) by mouth 3 (three) times daily. Patient not taking: Reported on 11/22/2022 09/17/22   Gildardo Pounds, NP    Family History Family History  Problem Relation Age of Onset   Hyperlipidemia Mother    Arthritis Mother     Parkinson's disease Mother    Heart attack Father    Diabetes Father    Hypertension Father    Hyperlipidemia Father    Cancer Sister        cervical   Breast cancer Sister 94   Breast cancer Sister    Colon cancer Maternal Grandmother 97   Breast cancer Paternal Aunt    Kidney disease Other    Stroke  Other    Pancreatic disease Other    Sickle cell anemia Other    Esophageal cancer Neg Hx    Rectal cancer Neg Hx     Social History Social History   Tobacco Use   Smoking status: Every Day    Packs/day: 0.25    Years: 4.00    Total pack years: 1.00    Types: Cigarettes   Smokeless tobacco: Never  Vaping Use   Vaping Use: Never used  Substance Use Topics   Alcohol use: No    Alcohol/week: 0.0 standard drinks of alcohol   Drug use: No    Comment: no drug use for 7 years     Allergies   Sulfa antibiotics   Review of Systems Review of Systems  Constitutional: Negative.   HENT: Negative.    Respiratory: Negative.    Cardiovascular: Negative.   Gastrointestinal: Negative.   Musculoskeletal:  Positive for arthralgias.  Psychiatric/Behavioral: Negative.       Physical Exam Triage Vital Signs ED Triage Vitals [12/24/22 1345]  Enc Vitals Group     BP 113/75     Pulse Rate 75     Resp 18     Temp 98.2 F (36.8 C)     Temp Source Oral     SpO2 95 %     Weight      Height      Head Circumference      Peak Flow      Pain Score 8     Pain Loc      Pain Edu?      Excl. in [place]?    No data found.  Updated Vital Signs BP 113/75 (BP Location: Right Arm)   Pulse 75   Temp 98.2 F (36.8 C) (Oral)   Resp 18   LMP 07/21/2013   SpO2 95%   Visual Acuity Right Eye Distance:   Left Eye Distance:   Bilateral Distance:    Right Eye Near:   Left Eye Near:    Bilateral Near:     Physical Exam Constitutional:      Appearance: Normal appearance.  Musculoskeletal:     Comments: No obvious swelling to the left hand;  she has TTP to the left 5th finger, at  the mcp and pip joint;  full rom without limitiations  Skin:    General: Skin is warm.  Neurological:     General: No focal deficit present.     Mental Status: She is alert.  Psychiatric:        Mood and Affect: Mood normal.      UC Treatments / Results  Labs (all labs ordered are listed, but only abnormal results are displayed) Labs Reviewed - No data to display  EKG   Radiology DG Finger Little Left  Result Date: 12/24/2022 CLINICAL DATA:  [place] 2 weeks ago.  Injured fifth digit. EXAM: LEFT LITTLE FINGER 2+V COMPARISON:  None Available. 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 No acute fracture is identified. 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 No acute fracture. Electronically Signed   By: Marijo Sanes M.D.   On: 12/24/2022 14:40    Procedures Procedures (including critical care time)  Medications Ordered in UC Medications - No data to display  Initial Impression / Assessment and Plan / UC Course  I have reviewed the triage vital signs and the nursing notes.  Pertinent labs & imaging results that were available during my care of the patient were reviewed by me and considered in my medical decision making (see chart for details).   Final Clinical Impressions(s) / UC Diagnoses   Final diagnoses:  Finger pain, left  Hand pain, left     Discharge Instructions      You were seen today for hand/finger pain. The xray shows arthritis but nothing broken/fractured.  For now I recommend motrin and ice for pain.   If you continue with pain you may wish to follow up with your primary care provider.     ED Prescriptions   None    PDMP not reviewed this encounter.   Rondel Oh, MD 12/24/22 [phone]

## 2023-01-04 ENCOUNTER — Ambulatory Visit (HOSPITAL_COMMUNITY)
Admission: EM | Admit: 2023-01-04 | Discharge: 2023-01-04 | Disposition: A | Discharge status: Home / Self Care | Payer: 59 | Attending: Nurse Practitioner | Admitting: Nurse Practitioner

## 2023-01-04 ENCOUNTER — Encounter (HOSPITAL_COMMUNITY): Payer: Self-pay

## 2023-01-04 DIAGNOSIS — J029 Acute pharyngitis, unspecified: Secondary | ICD-10-CM | POA: Insufficient documentation

## 2023-01-04 DIAGNOSIS — Z1152 Encounter for screening for COVID-19: Secondary | ICD-10-CM | POA: Diagnosis not present

## 2023-01-04 LAB — POCT RAPID STREP A, ED / UC: Streptococcus, Group A Screen (Direct): NEGATIVE

## 2023-01-04 MED ORDER — BENZONATATE 100 MG PO CAPS: 100.0000 mg | ORAL_CAPSULE | Freq: Three times a day (TID) | ORAL | 0 refills | Status: DC

## 2023-01-04 NOTE — Discharge Instructions (Addendum)
Your Strep Test is negative.  Your COVID is pending. Your results will show in your MyChart. Any positive results will result in a phone call from a nurse with next steps in treatment and recommendations.   You may have an Upper Respiratory Infection We encourage conservative treatment with symptom relief. We encourage you to use Tylenol alternating with Ibuprofen for your fever if not contraindicated. (Remember to use as directed do not exceed daily dosing recommendations) We also encourage salt water gargles for your sore throat. You should also consider throat lozenges and chloraseptic spray.  Your cough can be soothed with a cough suppressant. We have prescribed you a cough suppressant to be taken as  directed. Benzonatate 100 mg every 8 hours for cough

## 2023-01-04 NOTE — ED Triage Notes (Signed)
Patient here today with c/o cough, ST, runny nose, EA, fever, chills, and sweats X 6 days. Her daughter and son in law had been diagnosed with Covid. She has been taking Imodium AD, Pepto bismol, Tylenol cold and flu She has noticed some relief but she is still not feeling well and has been out of work 3 days last week. No recent travel.

## 2023-01-04 NOTE — ED Provider Notes (Incomplete)
[place]    CSN: [id number] Arrival date & time: 01/04/23  1150      History   Chief Complaint Chief Complaint  Patient presents with   Sore Throat   Cough    HPI Melissa Pham is a 57 y.o. female.   HPI  She is in today with complaining of sore throat with cough. This has been going on for 6 days. Exposure positive/negative COVID/Influenza/Strep.  Denies Fever, chills, nasal congestion, sneezing, runny nose, cough, new sore throat, new loss of smell or taste, shortness of breath, chest pain, nausea, or diarrhea. The current treatment has been OTC decongestant, cough syrup, Dayquil, Nyquil, Mucinex D/DM   Past Medical History:  Diagnosis Date   ADHD (attention deficit hyperactivity disorder)    Anxiety    Asthma    Bipolar disorder ([place])    Blood transfusion without reported diagnosis 11/05/1983   after childbirth   Coronary artery disease    Depression    Diabetes mellitus without complication (HCC)    GERD (gastroesophageal reflux disease)    History of illicit drug use    Hyperlipidemia    Hypertension    Schizophrenia ([place])    Sickle cell trait ([place])    Tobacco use     Patient Active Problem List   Diagnosis Date Noted   Syncope 12/06/2018   CAD in native artery    Tobacco abuse 07/27/2018   Pure hypercholesterolemia    Generalized abdominal discomfort 12/10/2017   Effusion, right knee 03/13/2017   Sprain of right knee 03/13/2017   Chest pain 02/20/2015   Suprapubic pain 02/20/2015   Diarrhea 02/20/2015   Diabetes mellitus, type 2 ([place]) 02/20/2015   HTN (hypertension) 02/20/2015   Dyslipidemia 02/20/2015   Bipolar affective disorder ([place]) 02/20/2015   Schizophrenia ([place]) 02/20/2015   Hypokalemia 02/20/2015    Past Surgical History:  Procedure Laterality Date   CERVICAL BIOPSY  W/ LOOP ELECTRODE EXCISION     left eye surgery  1970   TUBAL LIGATION  1989    OB History     Gravida  7   Para      Term      Preterm       AB  3   Living  4      SAB  1   IAB  2   Ectopic      Multiple      Live Births               Home Medications    Prior to Admission medications   Medication Sig Start Date End Date Taking? Authorizing Provider  Accu-Chek Softclix Lancets lancets Use as instructed. Check blood glucose level by fingerstick 2 times per day. 09/17/22  Yes Gildardo Pounds, NP  albuterol (PROVENTIL) (2.5 MG/3ML) 0.083% nebulizer solution Take 3 mLs (2.5 mg total) by nebulization every 6 (six) hours as needed for wheezing or shortness of breath. 03/01/22  Yes Talbot Grumbling, FNP  Albuterol Sulfate (PROAIR RESPICLICK) 123XX123 (90 Base) MCG/ACT AEPB Inhale 1 puff into the lungs every 6 (six) hours. 09/19/22  Yes Charlott Rakes, MD  aspirin EC 81 MG tablet Take 81 mg by mouth daily.   Yes [provider]  benzonatate (TESSALON) 100 MG capsule Take 1 capsule (100 mg total) by mouth every 8 (eight) hours. 01/04/23  Yes Hajira Verhagen, Diona Foley, NP  busPIRone (BUSPAR) 7.5 MG tablet Take 1 tablet (7.5 mg total) by mouth 2 (two) times daily. For  anxiety 09/17/22  Yes Gildardo Pounds, NP  diclofenac Sodium (VOLTAREN) 1 % GEL Apply 4 g topically 4 (four) times daily. 02/23/20  Yes Joy, Shawn C, PA-C  dicyclomine (BENTYL) 20 MG tablet Take 1 tablet (20 mg total) by mouth 2 (two) times daily. 07/30/22  Yes Smoot, Sarah A, PA-C  gabapentin (NEURONTIN) 300 MG capsule Take 1 capsule (300 mg total) by mouth at bedtime. 09/17/22  Yes Gildardo Pounds, NP  glimepiride (AMARYL) 2 MG tablet Take 1 tablet (2 mg total) by mouth daily with breakfast. 09/17/22  Yes Gildardo Pounds, NP  glucose blood (ACCU-CHEK GUIDE) test strip Use as instructed. Check blood glucose by fingerstick twice per day. 09/17/22  Yes Gildardo Pounds, NP  hydrocortisone (ANUSOL-HC) 2.5 % rectal cream Place 1 Application rectally 2 (two) times daily. 07/29/22  Yes White, Adrienne R, NP  ibuprofen (ADVIL) 400 MG tablet Take 1 tablet (400 mg total) by  mouth every 6 (six) hours as needed for moderate pain. 05/02/21  Yes Jaynee Eagles, PA-C  Lidocaine-Glycerin (PREPARATION H RE) Place 1 application  rectally as needed (hemmroids).   Yes [provider]  lisinopril (ZESTRIL) 5 MG tablet Take 1 tablet (5 mg total) by mouth daily. 09/17/22  Yes Gildardo Pounds, NP  MELOXICAM PO Take by mouth.   Yes [provider]  metoprolol succinate (TOPROL-XL) 50 MG 24 hr tablet Take 1 tablet (50 mg total) by mouth daily. Take with or immediately following a meal. 09/17/22  Yes Gildardo Pounds, NP  pantoprazole (PROTONIX) 40 MG tablet Take 1 tablet (40 mg total) by mouth daily. 09/17/22  Yes Gildardo Pounds, NP  PARoxetine (PAXIL) 20 MG tablet Take 1 tablet (20 mg total) by mouth daily. 09/17/22  Yes Gildardo Pounds, NP  simvastatin (ZOCOR) 40 MG tablet Take 1 tablet (40 mg total) by mouth daily. 09/17/22  Yes Gildardo Pounds, NP  sucralfate (CARAFATE) 1 g tablet Take 1 tablet (1 g total) by mouth 2 (two) times daily. 03/27/22  Yes Meccariello, Bernita Raisin, MD  valACYclovir (VALTREX) 1000 MG tablet Take 1 tablet (1,000 mg total) by mouth 3 (three) times daily. 09/17/22  Yes Gildardo Pounds, NP  budesonide-formoterol (SYMBICORT) 160-4.5 MCG/ACT inhaler Inhale 2 puffs into the lungs once for 1 dose. 09/17/22 09/19/22  Gildardo Pounds, NP  Multiple Vitamins-Minerals (MULTIVITAMIN WITH MINERALS) tablet Take 1 tablet by mouth daily. Patient not taking: Reported on 11/11/2022    [provider]  Na Sulfate-K Sulfate-Mg Sulf (SUPREP BOWEL PREP KIT) 17.5-3.13-1.6 GM/177ML SOLN Take 1 kit by mouth as directed. 11/11/22   Sharyn Creamer, MD  nitroGLYCERIN (NITROSTAT) 0.4 MG SL tablet Place 0.4 mg under the tongue every 5 (five) minutes as needed for chest pain. Patient not taking: Reported on 09/19/2022    [provider]    Family History Family History  Problem Relation Age of Onset   Hyperlipidemia Mother    Arthritis Mother     Parkinson's disease Mother    Heart attack Father    Diabetes Father    Hypertension Father    Hyperlipidemia Father    Cancer Sister        cervical   Breast cancer Sister 56   Breast cancer Sister    Colon cancer Maternal Grandmother 26   Breast cancer Paternal Aunt    Kidney disease Other    Stroke Other    Pancreatic disease Other    Sickle cell anemia Other  Esophageal cancer Neg Hx    Rectal cancer Neg Hx     Social History Social History   Tobacco Use   Smoking status: Every Day    Packs/day: 0.25    Years: 4.00    Total pack years: 1.00    Types: Cigarettes   Smokeless tobacco: Never  Vaping Use   Vaping Use: Never used  Substance Use Topics   Alcohol use: No    Alcohol/week: 0.0 standard drinks of alcohol   Drug use: No    Comment: no drug use for 7 years     Allergies   Sulfa antibiotics   Review of Systems Review of Systems   Physical Exam Triage Vital Signs ED Triage Vitals  Enc Vitals Group     BP 01/04/23 1232 126/74     Pulse Rate 01/04/23 1232 62     Resp 01/04/23 1232 16     Temp 01/04/23 1232 98.7 F (37.1 C)     Temp Source 01/04/23 1232 Oral     SpO2 01/04/23 1232 97 %     Weight --      Height 01/04/23 1232 5' (1.524 m)     Head Circumference --      Peak Flow --      Pain Score 01/04/23 1231 8     Pain Loc --      Pain Edu? --      Excl. in [place]? --    No data found.  Updated Vital Signs BP 126/74 (BP Location: Right Arm)   Pulse 62   Temp 98.7 F (37.1 C) (Oral)   Resp 16   Ht 5' (1.524 m)   LMP 07/21/2013   SpO2 97%   BMI 33.98 kg/m   Visual Acuity Right Eye Distance:   Left Eye Distance:   Bilateral Distance:    Right Eye Near:   Left Eye Near:    Bilateral Near:     Physical Exam Constitutional:      General: She is not in acute distress.    Appearance: She is obese. She is not ill-appearing, toxic-appearing or diaphoretic.  HENT:     Head: Atraumatic.     Right Ear: Tympanic membrane normal.      Left Ear: Tympanic membrane normal.     Nose: No congestion.     Mouth/Throat:     Mouth: Mucous membranes are moist.     Tonsils: No tonsillar exudate or tonsillar abscesses.  Eyes:     Conjunctiva/sclera: Conjunctivae normal.  Cardiovascular:     Rate and Rhythm: Normal rate and regular rhythm.     Heart sounds: Normal heart sounds.  Pulmonary:     Effort: Pulmonary effort is normal.  Musculoskeletal:     Cervical back: Normal range of motion and neck supple.  Skin:    Capillary Refill: Capillary refill takes 2 to 3 seconds.  Neurological:     Mental Status: She is alert and oriented to person, place, and time.  Psychiatric:        Mood and Affect: Mood normal.     UC Treatments / Results  Labs (all labs ordered are listed, but only abnormal results are displayed) Labs Reviewed  SARS CORONAVIRUS 2 (TAT 6-24 HRS)  POCT RAPID STREP A, ED / UC    EKG   Radiology No results found.  Procedures Procedures (including critical care time)  Medications Ordered in UC Medications - No data to display  Initial Impression / Assessment and  Plan / UC Course  I have reviewed the triage vital signs and the nursing notes.  Pertinent labs & imaging results that were available during my care of the patient were reviewed by me and considered in my medical decision making (see chart for details).     Sore throat Final Clinical Impressions(s) / UC Diagnoses   Final diagnoses:  Sore throat     Discharge Instructions      Your Strep Test is negative.  Your COVID is pending. Your results will show in your MyChart. Any positive results will result in a phone call from a nurse with next steps in treatment and recommendations.   You may have an Upper Respiratory Infection We encourage conservative treatment with symptom relief. We encourage you to use Tylenol alternating with Ibuprofen for your fever if not contraindicated. (Remember to use as directed do not exceed daily  dosing recommendations) We also encourage salt water gargles for your sore throat. You should also consider throat lozenges and chloraseptic spray.  Your cough can be soothed with a cough suppressant. We have prescribed you a cough suppressant to be taken as  directed. Benzonatate 100 mg every 8 hours for cough        ED Prescriptions     Medication Sig Dispense Auth. Provider   benzonatate (TESSALON) 100 MG capsule Take 1 capsule (100 mg total) by mouth every 8 (eight) hours. 21 capsule Vevelyn Francois, NP      PDMP not reviewed this encounter.   Vevelyn Francois, NP 01/06/23 0159    Vevelyn Francois, NP 01/06/23 [phone]

## 2023-01-05 LAB — SARS CORONAVIRUS 2 (TAT 6-24 HRS): SARS Coronavirus 2: NEGATIVE

## 2023-01-20 ENCOUNTER — Ambulatory Visit: Payer: 59 | Attending: Nurse Practitioner | Admitting: Nurse Practitioner

## 2023-01-20 ENCOUNTER — Encounter: Payer: Self-pay | Admitting: Nurse Practitioner

## 2023-01-20 VITALS — BP 110/68 | HR 87 | Ht 60.0 in | Wt 177.0 lb

## 2023-01-20 DIAGNOSIS — H9193 Unspecified hearing loss, bilateral: Secondary | ICD-10-CM | POA: Diagnosis not present

## 2023-01-20 DIAGNOSIS — Z0000 Encounter for general adult medical examination without abnormal findings: Secondary | ICD-10-CM | POA: Diagnosis not present

## 2023-01-20 NOTE — Progress Notes (Signed)
Subjective:    Melissa Pham is a 57 y.o. female who presents for a Welcome to Medicare exam.   Review of Systems  Constitutional:  Negative for fever, malaise/fatigue and weight loss.  HENT:  Positive for hearing loss (wears bilateral hearing aids; Lost one recently. Needs replacement.). Negative for nosebleeds.   Eyes: Negative.  Negative for blurred vision, double vision and photophobia.  Respiratory: Negative.  Negative for cough and shortness of breath.   Cardiovascular:  Positive for leg swelling (requesting chair lift for stairs due to intermittent swelling in legs). Negative for chest pain and palpitations.  Gastrointestinal: Negative.  Negative for heartburn, nausea and vomiting.  Musculoskeletal: Negative.  Negative for myalgias.  Neurological: Negative.  Negative for dizziness, focal weakness, seizures and headaches.  Psychiatric/Behavioral: Negative.  Negative for suicidal ideas.     Cardiac Risk Factors include: diabetes mellitus;smoking/ tobacco exposure      Objective:    Today's Vitals   01/20/23 1400  BP: 110/68  Pulse: 87  SpO2: 98%  Weight: 177 lb (80.3 kg)  Height: 5' (1.524 m)  Body mass index is 34.57 kg/m.  Medications Outpatient Encounter Medications as of 01/20/2023  Medication Sig   albuterol (PROVENTIL) (2.5 MG/3ML) 0.083% nebulizer solution Take 3 mLs (2.5 mg total) by nebulization every 6 (six) hours as needed for wheezing or shortness of breath.   Albuterol Sulfate (PROAIR RESPICLICK) 536 (90 Base) MCG/ACT AEPB Inhale 1 puff into the lungs every 6 (six) hours.   aspirin EC 81 MG tablet Take 81 mg by mouth daily.   busPIRone (BUSPAR) 7.5 MG tablet Take 1 tablet (7.5 mg total) by mouth 2 (two) times daily. For anxiety   dicyclomine (BENTYL) 20 MG tablet Take 1 tablet (20 mg total) by mouth 2 (two) times daily.   gabapentin (NEURONTIN) 300 MG capsule Take 1 capsule (300 mg total) by mouth at bedtime.   glimepiride (AMARYL) 2 MG tablet Take 1  tablet (2 mg total) by mouth daily with breakfast.   glucose blood (ACCU-CHEK GUIDE) test strip Use as instructed. Check blood glucose by fingerstick twice per day.   hydrocortisone (ANUSOL-HC) 2.5 % rectal cream Place 1 Application rectally 2 (two) times daily.   ibuprofen (ADVIL) 400 MG tablet Take 1 tablet (400 mg total) by mouth every 6 (six) hours as needed for moderate pain.   Lidocaine-Glycerin (PREPARATION H RE) Place 1 application  rectally as needed (hemmroids).   lisinopril (ZESTRIL) 5 MG tablet Take 1 tablet (5 mg total) by mouth daily.   MELOXICAM PO Take by mouth.   metoprolol succinate (TOPROL-XL) 50 MG 24 hr tablet Take 1 tablet (50 mg total) by mouth daily. Take with or immediately following a meal.   pantoprazole (PROTONIX) 40 MG tablet Take 1 tablet (40 mg total) by mouth daily.   simvastatin (ZOCOR) 40 MG tablet Take 1 tablet (40 mg total) by mouth daily.   valACYclovir (VALTREX) 1000 MG tablet Take 1 tablet (1,000 mg total) by mouth 3 (three) times daily.   Accu-Chek Softclix Lancets lancets Use as instructed. Check blood glucose level by fingerstick 2 times per day. (Patient not taking: Reported on 01/20/2023)   benzonatate (TESSALON) 100 MG capsule Take 1 capsule (100 mg total) by mouth every 8 (eight) hours. (Patient not taking: Reported on 01/20/2023)   budesonide-formoterol (SYMBICORT) 160-4.5 MCG/ACT inhaler Inhale 2 puffs into the lungs once for 1 dose.   diclofenac Sodium (VOLTAREN) 1 % GEL Apply 4 g topically 4 (four) times daily. (  Patient not taking: Reported on 01/20/2023)   Multiple Vitamins-Minerals (MULTIVITAMIN WITH MINERALS) tablet Take 1 tablet by mouth daily. (Patient not taking: Reported on 11/11/2022)   Na Sulfate-K Sulfate-Mg Sulf (SUPREP BOWEL PREP KIT) 17.5-3.13-1.6 GM/177ML SOLN Take 1 kit by mouth as directed. (Patient not taking: Reported on 01/20/2023)   nitroGLYCERIN (NITROSTAT) 0.4 MG SL tablet Place 0.4 mg under the tongue every 5 (five) minutes as needed  for chest pain. (Patient not taking: Reported on 01/20/2023)   PARoxetine (PAXIL) 20 MG tablet Take 1 tablet (20 mg total) by mouth daily. (Patient not taking: Reported on 01/20/2023)   sucralfate (CARAFATE) 1 g tablet Take 1 tablet (1 g total) by mouth 2 (two) times daily. (Patient not taking: Reported on 01/20/2023)   No facility-administered encounter medications on file as of 01/20/2023.     History: Past Medical History:  Diagnosis Date   ADHD (attention deficit hyperactivity disorder)    Anxiety    Asthma    Bipolar disorder ([place])    Blood transfusion without reported diagnosis 11/05/1983   after childbirth   Coronary artery disease    Depression    Diabetes mellitus without complication (HCC)    GERD (gastroesophageal reflux disease)    History of illicit drug use    Hyperlipidemia    Hypertension    Schizophrenia ([place])    Sickle cell trait ([place])    Tobacco use    Past Surgical History:  Procedure Laterality Date   CERVICAL BIOPSY  W/ LOOP ELECTRODE EXCISION     left eye surgery  [place]    Family History  Problem Relation Age of Onset   Hyperlipidemia Mother    Arthritis Mother    Parkinson's disease Mother    Heart attack Father    Diabetes Father    Hypertension Father    Hyperlipidemia Father    Cancer Sister        cervical   Breast cancer Sister 49   Breast cancer Sister    Colon cancer Maternal Grandmother 66   Breast cancer Paternal Aunt    Kidney disease Other    Stroke Other    Pancreatic disease Other    Sickle cell anemia Other    Esophageal cancer Neg Hx    Rectal cancer Neg Hx    Social History   Occupational History   Not on file  Tobacco Use   Smoking status: Every Day    Packs/day: 0.25    Years: 4.00    Additional pack years: 0.00    Total pack years: 1.00    Types: Cigarettes   Smokeless tobacco: Never  Vaping Use   Vaping Use: Never used  Substance and Sexual Activity   Alcohol use: No    Alcohol/week:  0.0 standard drinks of alcohol   Drug use: No    Comment: no drug use for 7 years   Sexual activity: Not Currently    Birth control/protection: Surgical    Tobacco Counseling Ready to quit: No Counseling given: Yes   Immunizations and Health Maintenance Immunization History  Administered Date(s) Administered   Covid-19, Mrna,Vaccine(Spikevax)65yrs and older 07/31/2022   Hepatitis A 04/03/2022   Influenza, Quadrivalent, Recombinant, Inj, Pf 08/27/2018, 07/26/2019   Influenza,inj,Quad PF,6+ Mos 12/13/2017, 07/15/2020   Influenza-Unspecified 07/26/2022   PFIZER(Purple Top)SARS-COV-2 Vaccination 02/10/2020, 03/03/2020, 10/10/2020, 02/12/2021   Tdap 08/11/2019   Zoster Recombinat (Shingrix) 07/26/2019, 09/28/2019   Health Maintenance Due  Topic Date Due   FOOT  EXAM  Never done   COLONOSCOPY (Pts 45-28yrs Insurance coverage will need to be confirmed)  Never done    Activities of Daily Living    01/20/2023    2:10 PM  In your present state of health, do you have any difficulty performing the following activities:  Hearing? 1  Vision? 1  Difficulty concentrating or making decisions? 1  Walking or climbing stairs? 1  Dressing or bathing? 1  Doing errands, shopping? 1  Preparing Food and eating ? Y  Using the Toilet? N  In the past six months, have you accidently leaked urine? Y  Do you have problems with loss of bowel control? Y  Managing your Medications? Y  Managing your Finances? Y  Housekeeping or managing your Housekeeping? Y    Other factors deemed appropriate based on the beneficiary's medical and social history and current clinical standards.  Advanced Directives: NONE      Assessment:    This is a routine wellness examination for this patient .   Vision/Hearing screen Vision Screening   Right eye Left eye Both eyes  Without correction 20/100 20/70 20/100  With correction 20/13 20/13 20/10     Dietary issues and exercise activities discussed:  Current  Exercise Habits: The patient does not participate in regular exercise at present   Goals   None   Depression Screen    01/20/2023    2:23 PM 12/04/2022    9:56 AM 11/22/2022    9:05 AM 09/17/2022    4:33 PM  PHQ 2/9 Scores  PHQ - 2 Score 2  2 0  PHQ- 9 Score 9  9 0     Information is confidential and restricted. Go to Review Flowsheets to unlock data.     Fall Risk    01/20/2023    2:08 PM  [place] in the past year? 0  Number falls in past yr: 1  Injury with Fall? 0  Risk for fall due to : No Fall Risks  Follow up Falls evaluation completed    Cognitive Function: Bipolar disorder        01/20/2023    2:13 PM  6CIT Screen  What Year? 0 points  What month? 0 points  What time? 0 points  Count back from 20 0 points  Months in reverse 0 points  Repeat phrase 2 points  Total Score 2 points    Patient Care Team: Gildardo Pounds, NP as PCP - General (Nurse Practitioner)     Siiri was seen today for medicare wellness.  Diagnoses and all orders for this visit:  Encounter for Medicare annual wellness exam  Decreased hearing of both ears -     Hearing screening; Future     I have personally reviewed and noted the following in the patient's chart:   Medical and social history Use of alcohol, tobacco or illicit drugs  Current medications and supplements Functional ability and status Nutritional status Physical activity Advanced directives List of other physicians Hospitalizations, surgeries, and ER visits in previous 12 months Vitals Screenings to include cognitive, depression, and falls Referrals and appointments  In addition, I have reviewed and discussed with patient certain preventive protocols, quality metrics, and best practice recommendations. A written personalized care plan for preventive services as well as general preventive health recommendations were provided to patient.     Gildardo Pounds, NP 01/20/2023

## 2023-01-27 ENCOUNTER — Ambulatory Visit: Payer: Self-pay | Admitting: *Deleted

## 2023-01-27 ENCOUNTER — Other Ambulatory Visit: Payer: Self-pay

## 2023-01-27 ENCOUNTER — Emergency Department (HOSPITAL_COMMUNITY): Payer: 59

## 2023-01-27 ENCOUNTER — Emergency Department (HOSPITAL_COMMUNITY)
Admission: EM | Admit: 2023-01-27 | Discharge: 2023-01-27 | Disposition: A | Discharge status: Home / Self Care | Payer: 59 | Attending: Emergency Medicine | Admitting: Emergency Medicine

## 2023-01-27 DIAGNOSIS — S199XXA Unspecified injury of neck, initial encounter: Secondary | ICD-10-CM | POA: Diagnosis present

## 2023-01-27 DIAGNOSIS — S39012A Strain of muscle, fascia and tendon of lower back, initial encounter: Secondary | ICD-10-CM | POA: Insufficient documentation

## 2023-01-27 DIAGNOSIS — E119 Type 2 diabetes mellitus without complications: Secondary | ICD-10-CM | POA: Diagnosis not present

## 2023-01-27 DIAGNOSIS — S3992XA Unspecified injury of lower back, initial encounter: Secondary | ICD-10-CM | POA: Diagnosis not present

## 2023-01-27 DIAGNOSIS — Z79899 Other long term (current) drug therapy: Secondary | ICD-10-CM | POA: Insufficient documentation

## 2023-01-27 DIAGNOSIS — I10 Essential (primary) hypertension: Secondary | ICD-10-CM | POA: Insufficient documentation

## 2023-01-27 DIAGNOSIS — R202 Paresthesia of skin: Secondary | ICD-10-CM | POA: Diagnosis not present

## 2023-01-27 DIAGNOSIS — S161XXA Strain of muscle, fascia and tendon at neck level, initial encounter: Secondary | ICD-10-CM | POA: Insufficient documentation

## 2023-01-27 DIAGNOSIS — Y9351 Activity, roller skating (inline) and skateboarding: Secondary | ICD-10-CM | POA: Insufficient documentation

## 2023-01-27 DIAGNOSIS — W19XXXA Unspecified fall, initial encounter: Secondary | ICD-10-CM

## 2023-01-27 DIAGNOSIS — M542 Cervicalgia: Secondary | ICD-10-CM | POA: Diagnosis not present

## 2023-01-27 DIAGNOSIS — R519 Headache, unspecified: Secondary | ICD-10-CM | POA: Insufficient documentation

## 2023-01-27 DIAGNOSIS — Z7982 Long term (current) use of aspirin: Secondary | ICD-10-CM | POA: Insufficient documentation

## 2023-01-27 DIAGNOSIS — M545 Low back pain, unspecified: Secondary | ICD-10-CM | POA: Diagnosis not present

## 2023-01-27 MED ORDER — METHOCARBAMOL 500 MG PO TABS: 500.0000 mg | ORAL_TABLET | Freq: Two times a day (BID) | ORAL | 0 refills | Status: DC

## 2023-01-27 NOTE — ED Provider Notes (Signed)
[place] EMERGENCY DEPARTMENT AT [facility] Provider Note   CSN: [id number] Arrival date & time: 01/27/23  1941     History {Add pertinent medical, surgical, social history, OB history to HPI:1} Chief Complaint  Patient presents with   Melissa Pham is a 57 y.o. female with past medical history seen for diabetes, hypertension, bipolar, schizophrenia who presents with concern for head pain, neck pain, low back pain, but pain after fall while rollerskating 8 days ago.  Patient reports some pain radiating to her groin, some right lateral thigh tingling.  Patient reports that she took some ibuprofen this afternoon, without significant improvement.  She has not take any blood thinners.  She denies any unilateral forearm numbness, tingling, confusion, memory loss.  Denies any history of IV drug use, chronic corticosteroid use, previous cancer.  Fall       Home Medications Prior to Admission medications   Medication Sig Start Date End Date Taking? Authorizing Provider  methocarbamol (ROBAXIN) 500 MG tablet Take 1 tablet (500 mg total) by mouth 2 (two) times daily. 01/27/23  Yes Jarrell Armond H, PA-C  Accu-Chek Softclix Lancets lancets Use as instructed. Check blood glucose level by fingerstick 2 times per day. Patient not taking: Reported on 01/20/2023 09/17/22   Gildardo Pounds, NP  albuterol (PROVENTIL) (2.5 MG/3ML) 0.083% nebulizer solution Take 3 mLs (2.5 mg total) by nebulization every 6 (six) hours as needed for wheezing or shortness of breath. 03/01/22   Talbot Grumbling, FNP  Albuterol Sulfate (PROAIR RESPICLICK) 123XX123 (90 Base) MCG/ACT AEPB Inhale 1 puff into the lungs every 6 (six) hours. 09/19/22   Charlott Rakes, MD  aspirin EC 81 MG tablet Take 81 mg by mouth daily.    [provider]  benzonatate (TESSALON) 100 MG capsule Take 1 capsule (100 mg total) by mouth every 8 (eight) hours. Patient not taking: Reported on 01/20/2023 01/04/23    Vevelyn Francois, NP  budesonide-formoterol [facility]) 160-4.5 MCG/ACT inhaler Inhale 2 puffs into the lungs once for 1 dose. 09/17/22 09/19/22  Gildardo Pounds, NP  busPIRone (BUSPAR) 7.5 MG tablet Take 1 tablet (7.5 mg total) by mouth 2 (two) times daily. For anxiety 09/17/22   Gildardo Pounds, NP  diclofenac Sodium (VOLTAREN) 1 % GEL Apply 4 g topically 4 (four) times daily. Patient not taking: Reported on 01/20/2023 02/23/20   Joy, Helane Gunther, PA-C  dicyclomine (BENTYL) 20 MG tablet Take 1 tablet (20 mg total) by mouth 2 (two) times daily. 07/30/22   Smoot, Sarah A, PA-C  gabapentin (NEURONTIN) 300 MG capsule Take 1 capsule (300 mg total) by mouth at bedtime. 09/17/22   Gildardo Pounds, NP  glimepiride (AMARYL) 2 MG tablet Take 1 tablet (2 mg total) by mouth daily with breakfast. 09/17/22   Gildardo Pounds, NP  glucose blood (ACCU-CHEK GUIDE) test strip Use as instructed. Check blood glucose by fingerstick twice per day. 09/17/22   Gildardo Pounds, NP  hydrocortisone (ANUSOL-HC) 2.5 % rectal cream Place 1 Application rectally 2 (two) times daily. 07/29/22   White, Leitha Schuller, NP  ibuprofen (ADVIL) 400 MG tablet Take 1 tablet (400 mg total) by mouth every 6 (six) hours as needed for moderate pain. 05/02/21   Jaynee Eagles, PA-C  Lidocaine-Glycerin (PREPARATION H RE) Place 1 application  rectally as needed (hemmroids).    [provider]  lisinopril (ZESTRIL) 5 MG tablet Take 1 tablet (5 mg total) by mouth daily. 09/17/22  Gildardo Pounds, NP  MELOXICAM PO Take by mouth.    [provider]  metoprolol succinate (TOPROL-XL) 50 MG 24 hr tablet Take 1 tablet (50 mg total) by mouth daily. Take with or immediately following a meal. 09/17/22   Gildardo Pounds, NP  Multiple Vitamins-Minerals (MULTIVITAMIN WITH MINERALS) tablet Take 1 tablet by mouth daily. Patient not taking: Reported on 11/11/2022    [provider]  Na Sulfate-K Sulfate-Mg Sulf (SUPREP BOWEL PREP KIT)  17.5-3.13-1.6 GM/177ML SOLN Take 1 kit by mouth as directed. Patient not taking: Reported on 01/20/2023 11/11/22   Sharyn Creamer, MD  nitroGLYCERIN (NITROSTAT) 0.4 MG SL tablet Place 0.4 mg under the tongue every 5 (five) minutes as needed for chest pain. Patient not taking: Reported on 01/20/2023    [provider]  pantoprazole (PROTONIX) 40 MG tablet Take 1 tablet (40 mg total) by mouth daily. 09/17/22   Gildardo Pounds, NP  PARoxetine (PAXIL) 20 MG tablet Take 1 tablet (20 mg total) by mouth daily. Patient not taking: Reported on 01/20/2023 09/17/22   Gildardo Pounds, NP  simvastatin (ZOCOR) 40 MG tablet Take 1 tablet (40 mg total) by mouth daily. 09/17/22   Gildardo Pounds, NP  sucralfate (CARAFATE) 1 g tablet Take 1 tablet (1 g total) by mouth 2 (two) times daily. Patient not taking: Reported on 01/20/2023 03/27/22   Meccariello, Bernita Raisin, MD  valACYclovir (VALTREX) 1000 MG tablet Take 1 tablet (1,000 mg total) by mouth 3 (three) times daily. 09/17/22   Gildardo Pounds, NP      Allergies    Sulfa antibiotics    Review of Systems   Review of Systems  All other systems reviewed and are negative.   Physical Exam Updated Vital Signs BP (!) 154/84 (BP Location: Right Arm)   Pulse 71   Temp 98.2 F (36.8 C) (Oral)   Resp 18   Ht 5' (1.524 m)   Wt 80.7 kg   LMP 07/21/2013   SpO2 98%   BMI 34.76 kg/m  Physical Exam Vitals and nursing note reviewed.  Constitutional:      General: She is not in acute distress.    Appearance: Normal appearance.  HENT:     Head: Normocephalic and atraumatic.  Eyes:     General:        Right eye: No discharge.        Left eye: No discharge.  Cardiovascular:     Rate and Rhythm: Normal rate and regular rhythm.  Pulmonary:     Effort: Pulmonary effort is normal. No respiratory distress.  Musculoskeletal:        General: No deformity.     Comments: Patient with significant tenderness of the right cervical paraspinous muscles, right  lumbar paraspinous muscles.  She is intact strength 5/5 bilateral upper and lower extremities.  She has normal coordination, normal gait throughout.  No midline spinal tenderness throughout.  She has no sensory deficits on my exam, can discern dull and sharp touch on bilateral legs, no saddle anesthesia.  Skin:    General: Skin is warm and dry.  Neurological:     Mental Status: She is alert and oriented to person, place, and time.  Psychiatric:        Mood and Affect: Mood normal.        Behavior: Behavior normal.     ED Results / Procedures / Treatments   Labs (all labs ordered are listed, but only abnormal results  are displayed) Labs Reviewed - No data to display  EKG None  Radiology CT Head Wo Contrast  Result Date: 01/27/2023 CLINICAL DATA:  Head and neck trauma, fall while roller-skating 01/19/2023. Ongoing head and neck pain. EXAM: CT HEAD WITHOUT CONTRAST CT CERVICAL SPINE WITHOUT CONTRAST TECHNIQUE: Multidetector CT imaging of the head and cervical spine was performed following the standard protocol without intravenous contrast. Multiplanar CT image reconstructions of the cervical spine were also generated. RADIATION DOSE REDUCTION: This exam was performed according to the departmental dose-optimization program which includes automated exposure control, adjustment of the mA and/or kV according to patient size and/or use of iterative reconstruction technique. COMPARISON:  12/06/2018. FINDINGS: CT HEAD FINDINGS Brain: No acute intracranial hemorrhage, midline shift or mass effect. No extra-axial fluid collection. Gray-white matter differentiation is within normal limits. No hydrocephalus. Vascular: No hyperdense vessel or unexpected calcification. Skull: Normal. Negative for fracture or focal lesion. Sinuses/Orbits: No acute finding. Other: None. CT CERVICAL SPINE FINDINGS Alignment: Normal.  There is loss of normal cervical lordosis. Skull base and vertebrae: No acute fracture. 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 Soft tissues and spinal canal: No prevertebral fluid or swelling. No visible canal hematoma. Disc levels: Multilevel intervertebral disc space narrowing and disc osteophyte complexes. Upper chest: No acute abnormality. Other: None. IMPRESSION: 1. No acute intracranial process. 2. Multilevel degenerative changes in the cervical spine without evidence of acute fracture. Electronically Signed   By: Brett Fairy M.D.   On: 01/27/2023 21:32   CT Cervical Spine Wo Contrast  Result Date: 01/27/2023 CLINICAL DATA:  Head and neck trauma, fall while roller-skating 01/19/2023. Ongoing head and neck pain. EXAM: CT HEAD WITHOUT CONTRAST CT CERVICAL SPINE WITHOUT CONTRAST TECHNIQUE: Multidetector CT imaging of the head and cervical spine was performed following the standard protocol without intravenous contrast. Multiplanar CT image reconstructions of the cervical spine were also generated. RADIATION DOSE REDUCTION: This exam was performed according to the departmental dose-optimization program which includes automated exposure control, adjustment of the mA and/or kV according to patient size and/or use of iterative reconstruction technique. COMPARISON:  12/06/2018. FINDINGS: CT HEAD FINDINGS Brain: No acute intracranial hemorrhage, midline shift or mass effect. No extra-axial fluid collection. Gray-white matter differentiation is within normal limits. No hydrocephalus. Vascular: No hyperdense vessel or unexpected calcification. Skull: Normal. Negative for fracture or focal lesion. Sinuses/Orbits: No acute finding. Other: None. CT CERVICAL SPINE FINDINGS Alignment: Normal.  There is loss of normal cervical lordosis. Skull base and vertebrae: No acute fracture. 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 Soft tissues and spinal canal: No prevertebral fluid or swelling. No visible canal hematoma. Disc levels: Multilevel intervertebral  disc space narrowing and disc osteophyte complexes. Upper chest: No acute abnormality. Other: None. IMPRESSION: 1. No acute intracranial process. 2. Multilevel degenerative changes in the cervical spine without evidence of acute fracture. Electronically Signed   By: Brett Fairy M.D.   On: 01/27/2023 21:32   CT Lumbar Spine Wo Contrast  Result Date: 01/27/2023 CLINICAL DATA:  Back trauma, no prior imaging (Age >= 16y) EXAM: CT LUMBAR SPINE WITHOUT CONTRAST TECHNIQUE: Multidetector CT imaging of the lumbar spine was performed without intravenous contrast administration. Multiplanar CT image reconstructions were also generated. RADIATION DOSE REDUCTION: This exam was performed according to the departmental dose-optimization program which includes automated exposure control, adjustment of the mA and/or kV according to patient size and/or use of iterative reconstruction technique. COMPARISON:  CT abdomen  pelvis 07/30/2022 FINDINGS: Segmentation: 5 lumbar type vertebrae. Alignment: Normal. Vertebrae: Facet arthropathy at the L4-L5 level with gas within the joint space. No acute fracture or focal pathologic process. Paraspinal and other soft tissues: Negative. Disc levels: Maintained at the lumbar level. T11-T12 intervertebral disc space vacuum phenomenon. Other: Atherosclerotic plaque.  Colonic diverticulosis. IMPRESSION: No acute displaced fracture or traumatic listhesis of the lumbar spine. Electronically Signed   By: Iven Finn M.D.   On: 01/27/2023 21:27    Procedures Procedures  {Document cardiac monitor, telemetry assessment procedure when appropriate:1}  Medications Ordered in ED Medications - No data to display  ED Course/ Medical Decision Making/ A&P   {   Click here for ABCD2, HEART and other calculatorsREFRESH Note before signing :1}                          Medical Decision Making Amount and/or Complexity of Data Reviewed Radiology: ordered.  Risk Prescription drug  management.   This patient is a 57 y.o. female who presents to the ED for concern of ***.   Differential diagnoses prior to evaluation: ***  Past Medical History / Social History / Additional history: Chart reviewed. Pertinent results include: ***  Physical Exam: Physical exam performed. The pertinent findings include: ***  Medications / Treatment: ***   Disposition: After consideration of the diagnostic results and the patients response to treatment, I feel that *** .   ***emergency department workup does not suggest an emergent condition requiring admission or immediate intervention beyond what has been performed at this time. The plan is: ***. The patient is safe for discharge and has been instructed to return immediately for worsening symptoms, change in symptoms or any other concerns.  Final Clinical Impression(s) / ED Diagnoses Final diagnoses:  Fall, initial encounter  Strain of neck muscle, initial encounter  Strain of lumbar region, initial encounter    Rx / DC Orders ED Discharge Orders          Ordered    methocarbamol (ROBAXIN) 500 MG tablet  2 times daily        01/27/23 2215

## 2023-01-27 NOTE — Telephone Encounter (Signed)
Reason for Disposition  Injury (or injuries) that need emergency care    Go to the urgent care  Answer Assessment - Initial Assessment Questions 1. MECHANISM: "How did the fall happen?"     I fell roller skating on 01/19/2023.   When they set me up they got my ponytail pulled off.   I was so embarrassed.   Some lady gave me hairpins to put the ponytail back on. The next day I went to my wellness appt.   I told them about it.   Then the 3rd day after falling my whole body was hurting.   My right neck to my shoulder.   Back of my shoulder blade is sore.    My buttocks really hurt.   I'm feeling pressure in my vaginal area when I sit.    My tailbone is hurting.    I'm soaking in Epson Salts.   And vinegar too.     2. DOMESTIC VIOLENCE AND ELDER ABUSE SCREENING: "Did you fall because someone pushed you or tried to hurt you?" If Yes, ask: "Are you safe now?"     No 3. ONSET: "When did the fall happen?" (e.g., minutes, hours, or days ago)     01/19/2023.    The pain shoots from my hip all the way down my leg.    My thigh feels numb on the right side.    4. LOCATION: "What part of the body hit the ground?" (e.g., back, buttocks, head, hips, knees, hands, head, stomach)     My butt.   I hit my right side.    It's my right side that's hurting the worse. 5. INJURY: "Did you hurt (injure) yourself when you fell?" If Yes, ask: "What did you injure? Tell me more about this?" (e.g., body area; type of injury; pain severity)"     Yes.  See above 6. PAIN: "Is there any pain?" If Yes, ask: "How bad is the pain?" (e.g., Scale 1-10; or mild,  moderate, severe)   - NONE (0): No pain   - MILD (1-3): Doesn't interfere with normal activities    - MODERATE (4-7): Interferes with normal activities or awakens from sleep    - SEVERE (8-10): Excruciating pain, unable to do any normal activities      Yes  especially in my butt and tailbone and hip on the right side.  7. SIZE: For cuts, bruises, or swelling, ask: "How  large is it?" (e.g., inches or centimeters)      Hurting on the inside 8. PREGNANCY: "Is there any chance you are pregnant?" "When was your last menstrual period?"     N/A 9. OTHER SYMPTOMS: "Do you have any other symptoms?" (e.g., dizziness, fever, weakness; new onset or worsening).      Hurting all over and sore 10. CAUSE: "What do you think caused the fall (or falling)?" (e.g., tripped, dizzy spell)       I was roller skating and fell.  Protocols used: Falls and [facility]

## 2023-01-27 NOTE — ED Triage Notes (Signed)
Pt fell backwards while rollerskating 8 days ago. +head strike but no LOC. States that she's been having some nausea since. Went to urgent care after this fall and then had wellness visit at PCP last Monday. Still c/o lower back pain that radiates to R thigh, R posterior thigh numbness, R sided neck pain and pain in her groin. Took ibuprofen this afternoon. Pt A&Ox4, VSS.

## 2023-01-27 NOTE — ED Provider Triage Note (Signed)
Emergency Medicine Provider Triage Evaluation Note  Melissa Pham , a 57 y.o. female  was evaluated in triage.  Pt complains of ongoing head pain, right neck pain, right low back pain, radiating to the right upper thigh.  Patient reports some occasional numbness coming along the lateral side of the right leg.  Patient reports that she has no saddle anesthesia but has a little bit of urgency when using the restroom..  Review of Systems  Positive: Head injury, neck injury Negative: Fever, chills  Physical Exam  BP (!) 154/84 (BP Location: Right Arm)   Pulse 71   Temp 98.2 F (36.8 C) (Oral)   Resp 18   Ht 5' (1.524 m)   Wt 80.7 kg   LMP 07/21/2013   SpO2 98%   BMI 34.76 kg/m  Gen:   Awake, no distress   Resp:  Normal effort  MSK:   Moves extremities without difficulty  Other:  Ttp in right cervical spine, right lumbar region. Intact strength 5/5 upper and lower extremities.  Medical Decision Making  Medically screening exam initiated at 8:42 PM.  Appropriate orders placed.  Kylei Beddoe was informed that the remainder of the evaluation will be completed by another provider, this initial triage assessment does not replace that evaluation, and the importance of remaining in the ED until their evaluation is complete.  Workup initiated in triage    Dorien Chihuahua 01/27/23 2043

## 2023-01-27 NOTE — Telephone Encounter (Signed)
  Chief Complaint: Fell roller skating on 01/19/2023 and now hurting all over. Symptoms: Right hip, tailbone, pain down leg and numbness in her thigh. Frequency: When fell roller skating.   Pain getting worse. Pertinent Negatives: Patient denies head injuries Disposition: [] ED /[x] Urgent Care (no appt availability in office) / [] Appointment(In office/virtual)/ []  [place] [facility]/ [] Home Care/ [] Refused Recommended Disposition /[] [facility]/ []  Follow-up with PCP Additional Notes: Pt has decided to go to the urgent care.    I let her know that would be fine.

## 2023-01-27 NOTE — Discharge Instructions (Signed)

## 2023-02-11 ENCOUNTER — Encounter: Payer: 59 | Admitting: Genetic Counselor

## 2023-02-11 ENCOUNTER — Other Ambulatory Visit: Payer: 59

## 2023-02-14 ENCOUNTER — Ambulatory Visit: Payer: Self-pay | Admitting: *Deleted

## 2023-02-14 ENCOUNTER — Other Ambulatory Visit: Payer: Self-pay | Admitting: Nurse Practitioner

## 2023-02-14 DIAGNOSIS — B009 Herpesviral infection, unspecified: Secondary | ICD-10-CM

## 2023-02-14 MED ORDER — IBUPROFEN 400 MG PO TABS: 400.0000 mg | ORAL_TABLET | Freq: Four times a day (QID) | ORAL | 0 refills | Status: DC | PRN

## 2023-02-14 NOTE — Telephone Encounter (Signed)
Called patient to review medication requests and doses. No answer, LMVTCB

## 2023-02-14 NOTE — Telephone Encounter (Signed)
See triage call.

## 2023-02-14 NOTE — Telephone Encounter (Signed)
Chief Complaint: requesting medication refills and increase dose of medications. Ibuprofen, meloxicam, proventil and a new nebulizer machine Symptoms: increased pain, leg swelling from feet to knees, right thigh numbness at times. Cramps in legs at times. Reports unable to drive and needs transportation assistance. Frequency: na Pertinent Negatives: Patient denies na  Disposition: [] ED /[x] Urgent Care (no appt availability in office) / [] Appointment(In office/virtual)/ []  [place] [facility]/ [] Home Care/ [] Refused Recommended Disposition /[] [place]/ []  Follow-up with PCP Additional Notes:   Requesting increase in dose of ibuprofen to 800 mg  and whatever dose PCP recommends for meloxicam. Requesting new nebulizer machine. Requesting assistance to set up transportation for future appt and pharmacy needs. Recommended patient be evaluated for leg swelling and patient reports if returns or worsens she will go to UC. Please advise. Patient waiting for all  medications to be sent for refill to pharmacy before she pays someone to take her to pharmacy to pick up medications.     Reason for Disposition . Prescription request for new medicine (not a refill) . [1] Swelling of both ankles/feet AND [2] MILD (Exception: heat edema)  Answer Assessment - Initial Assessment Questions 1. NAME of MEDICINE: "What medicine(s) are you calling about?"     Ibuprofen, meloxicam, nebulizer treatments ,valtrex 2. QUESTION: "What is your question?" (e.g., double dose of medicine, side effect)     Can I get medication dosages increased and get a new nebulizer machine 3. PRESCRIBER: "Who prescribed the medicine?" Reason: if prescribed by specialist, call should be referred to that group.     PCP 4. SYMPTOMS: "Do you have any symptoms?" If Yes, ask: "What symptoms are you having?"  "How bad are the symptoms (e.g., mild, moderate, severe)     Yes increased pain , hx sickle cell . Swelling legs cramps,  right thigh numbness 5. PREGNANCY:  "Is there any chance that you are pregnant?" "When was your last menstrual period?"     na  Answer Assessment - Initial Assessment Questions 1. ONSET: "When did the swelling start?" (e.g., minutes, hours, days)     Did not report 2. LOCATION: "What part of the leg is swollen?"  "Are both legs swollen or just one leg?"     Bilateral legs swelling to knees 3. DEGREE OF SWELLING: "How large is the swelling?"  - LOCALIZED - Small area of swelling on part of one leg (estimate the size) - WIDESPREAD - Swelling involves a large part of leg (calf, thigh or whole leg) or both legs/feet     Right leg more swollen than left  4. SEVERITY of WIDESPREAD SWELLING (e.g., Edema): "How bad is the swelling?" - MILD edema - swelling limited to foot and ankle, pitting edema < 1/4 inch deep, rest and elevation eliminate most or all swelling - MODERATE edema - swelling of lower leg to knee, pitting edema > 1/4 inch deep, rest and elevation only partially reduce swelling - SEVERE edema - swelling extends above knee, facial or hand swelling also present      Na  5. REDNESS: "Does the swelling look red or infected?"     na 6. PAIN: "Is there any pain?" If so, ask, "How bad is it?"     Cramps in legs 7. ITCH: "Does the swelling itch?" If so, ask, "How much?"     na 8. CAUSE: "What do you think caused the swelling?"      Not sure when elevates legs swelling decreases 9. CHRONIC DISEASE: "Does  your child have kidney, heart or liver disease?"     See hx  Protocols used: Medication Question Call-A-AH, Leg or Foot Swelling-P-AH

## 2023-02-14 NOTE — Telephone Encounter (Signed)
Summary: pt in pain, has sickle cell anemia   Pt states that she is in pain. Pt has sickle cell anemia. Pt was requesting a refill on ibuprofen and meloxicam (medication refill request has already been sent.) Pt would like to speak with a Nurse.      Called patient [phone] to review medication requests. No dose requested for meloxicam which is historical medication and requesting increase in dose of ibuprofen from 400 - 800 mg. No answer, LVMTCB [phone].

## 2023-02-14 NOTE — Telephone Encounter (Signed)
Medication Refill - Medication: ibuprofen (ADVIL) 400 MG tablet   (Pt would like 800 mg if possible) MELOXICAM PO Pt is out of medication.   Has the patient contacted their pharmacy? Yes.   Pt states that the Pharmacy advised her to reach out to her PCP for refill.    Preferred Pharmacy (with phone number or street name): [facility] [address] [place], [place] - [address]  Phone: [phone] Fax: [phone]  Has the patient been seen for an appointment in the last year OR does the patient have an upcoming appointment? Yes.    Agent: Please be advised that RX refills may take up to 3 business days. We ask that you follow-up with your pharmacy.

## 2023-02-18 NOTE — Telephone Encounter (Signed)
NEEDS VIRTUAL VISIT

## 2023-02-18 NOTE — Telephone Encounter (Signed)
Mychart visit scheduled  

## 2023-02-19 ENCOUNTER — Telehealth (HOSPITAL_BASED_OUTPATIENT_CLINIC_OR_DEPARTMENT_OTHER): Payer: 59 | Admitting: Nurse Practitioner

## 2023-02-19 ENCOUNTER — Encounter: Payer: Self-pay | Admitting: Nurse Practitioner

## 2023-02-19 DIAGNOSIS — J4520 Mild intermittent asthma, uncomplicated: Secondary | ICD-10-CM

## 2023-02-19 DIAGNOSIS — G8929 Other chronic pain: Secondary | ICD-10-CM

## 2023-02-19 DIAGNOSIS — M25561 Pain in right knee: Secondary | ICD-10-CM

## 2023-02-19 MED ORDER — ALBUTEROL SULFATE (2.5 MG/3ML) 0.083% IN NEBU: 2.5000 mg | INHALATION_SOLUTION | Freq: Four times a day (QID) | RESPIRATORY_TRACT | 12 refills | Status: DC | PRN

## 2023-02-19 MED ORDER — BUDESONIDE-FORMOTEROL FUMARATE 160-4.5 MCG/ACT IN AERO: 2.0000 | INHALATION_SPRAY | Freq: Once | RESPIRATORY_TRACT | 12 refills | Status: DC

## 2023-02-19 MED ORDER — PROAIR RESPICLICK 108 (90 BASE) MCG/ACT IN AEPB: 1.0000 | INHALATION_SPRAY | Freq: Four times a day (QID) | RESPIRATORY_TRACT | 3 refills | Status: DC

## 2023-02-19 MED ORDER — MELOXICAM 15 MG PO TABS: 15.0000 mg | ORAL_TABLET | Freq: Every day | ORAL | 3 refills | Status: DC

## 2023-02-19 NOTE — Progress Notes (Signed)
Virtual Visit Note  I discussed the limitations, risks, security and privacy concerns of performing an evaluation and management service by video and the availability of in person appointments. I also discussed with the patient that there may be a patient responsible charge related to this service. The patient expressed understanding and agreed to proceed.    I connected with Melissa Pham on 02/19/23  at  10:10 AM EDT  EDT by VIDEO and verified that I am speaking with the correct person using two identifiers.   Location of Patient: Private Residence   Location of Provider: [facility]    Persons participating in VIRTUAL visit: Bertram Denver FNP-BC Judith Demps    History of Present Illness: VIRTUAL visit for: medication refills and asthma   Asthma Follow-up: She has previously been evaluated here for asthma and presents for an asthma follow-up; she is not currently in exacerbation. Requesting a nebulizer today along with nebs. Symptoms include chest tightness, dyspnea, non-productive cough, and wheezing and occur  a few times per week with exertion . Observed precipitants include emotional upset, exercise, fumes, and cooking/smoke .  Current limitations in activity from asthma: none.  Number of days of school or work missed in the last month: not applicable. Number of Emergency Department visits in the previous month: none. Frequency of use of quick-relief meds: daily. The patient has been deviating from this regimen as follows: She has not been taking symbicort as prescribed. She was taking albuterol daily and symbicort as needed. I have instructed her to take her symbicort 2 puffs BID and albuterol as back up as it is a rescue inhaler only.       Notes LE swelling but also reporting she eats out daily/several times per week.      Past Medical History:  Diagnosis Date   ADHD (attention deficit hyperactivity disorder)    Anxiety    Asthma     Bipolar disorder    Blood transfusion without reported diagnosis 11/05/1983   after childbirth   Coronary artery disease    Depression    Diabetes mellitus without complication    GERD (gastroesophageal reflux disease)    History of illicit drug use    Hyperlipidemia    Hypertension    Schizophrenia    Sickle cell trait    Tobacco use     Past Surgical History:  Procedure Laterality Date   CERVICAL BIOPSY  W/ LOOP ELECTRODE EXCISION     left eye surgery  1970   TUBAL LIGATION  1989    Family History  Problem Relation Age of Onset   Hyperlipidemia Mother    Arthritis Mother    Parkinson's disease Mother    Heart attack Father    Diabetes Father    Hypertension Father    Hyperlipidemia Father    Cancer Sister        cervical   Breast cancer Sister 37   Breast cancer Sister    Colon cancer Maternal Grandmother 24   Breast cancer Paternal Aunt    Kidney disease Other    Stroke Other    Pancreatic disease Other    Sickle cell anemia Other    Esophageal cancer Neg Hx    Rectal cancer Neg Hx     Social History   Socioeconomic History   Marital status: Single    Spouse name: Not on file   Number of children: Not on file   Years of education: Not on file  Highest education level: Not on file  Occupational History   Not on file  Tobacco Use   Smoking status: Every Day    Packs/day: 0.25    Years: 4.00    Additional pack years: 0.00    Total pack years: 1.00    Types: Cigarettes   Smokeless tobacco: Never  Vaping Use   Vaping Use: Never used  Substance and Sexual Activity   Alcohol use: No    Alcohol/week: 0.0 standard drinks of alcohol   Drug use: No    Comment: no drug use for 7 years   Sexual activity: Not Currently    Birth control/protection: Surgical  Other Topics Concern   Not on file  Social History Narrative   Not on file   Social Determinants of Health   Financial Resource Strain: Low Risk  (01/20/2023)   Overall Financial Resource Strain  (CARDIA)    Difficulty of Paying Living Expenses: Not hard at all  Food Insecurity: Food Insecurity Present (01/20/2023)   Hunger Vital Sign    Worried About Running Out of Food in the Last Year: Often true    Ran Out of Food in the Last Year: Often true  Transportation Needs: No Transportation Needs (01/20/2023)   PRAPARE - Administrator, Civil Service (Medical): No    Lack of Transportation (Non-Medical): No  Physical Activity: Insufficiently Active (01/20/2023)   Exercise Vital Sign    Days of Exercise per Week: 1 day    Minutes of Exercise per Session: 20 min  Stress: Stress Concern Present (01/20/2023)   [employer] of Occupational Health - Occupational Stress Questionnaire    Feeling of Stress : To some extent  Social Connections: Moderately Integrated (01/20/2023)   Social Connection and Isolation Panel [NHANES]    Frequency of Communication with Friends and Family: More than three times a week    Frequency of Social Gatherings with Friends and Family: More than three times a week    Attends Religious Services: 1 to 4 times per year    Active Member of [employer] or Organizations: Yes    Attends Banker Meetings: 1 to 4 times per year    Marital Status: Never married     Observations/Objective: Awake, alert and oriented x 3   Review of Systems  Constitutional:  Negative for fever, malaise/fatigue and weight loss.  HENT: Negative.  Negative for nosebleeds.   Eyes: Negative.  Negative for blurred vision, double vision and photophobia.  Respiratory: Negative.  Negative for cough and shortness of breath.   Cardiovascular: Negative.  Negative for chest pain, palpitations and leg swelling.  Gastrointestinal: Negative.  Negative for heartburn, nausea and vomiting.  Musculoskeletal:  Positive for joint pain. Negative for myalgias.  Neurological: Negative.  Negative for dizziness, focal weakness, seizures and headaches.  Psychiatric/Behavioral: Negative.   Negative for suicidal ideas.     Assessment and Plan: Diagnoses and all orders for this visit:  Chronic right knee pain -     meloxicam (MOBIC) 15 MG tablet; Take 1 tablet (15 mg total) by mouth daily. Work on losing weight to help reduce joint pain. May alternate with heat and ice application for pain relief. May also alternate with acetaminophen as prescribed pain relief. Other alternatives include massage, acupuncture and water aerobics.  You must stay active and avoid a sedentary lifestyle.   Mild intermittent asthma without complication -     budesonide-formoterol (SYMBICORT) 160-4.5 MCG/ACT inhaler; Inhale 2 puffs into the lungs once  for 1 dose. -     Albuterol Sulfate (PROAIR RESPICLICK) 108 (90 Base) MCG/ACT AEPB; Inhale 1 puff into the lungs every 6 (six) hours. -     For home use only DME Nebulizer machine -     albuterol (PROVENTIL) (2.5 MG/3ML) 0.083% nebulizer solution; Take 3 mLs (2.5 mg total) by nebulization every 6 (six) hours as needed for wheezing or shortness of breath.     Follow Up Instructions Return if symptoms worsen or fail to improve.     I discussed the assessment and treatment plan with the patient. The patient was provided an opportunity to ask questions and all were answered. The patient agreed with the plan and demonstrated an understanding of the instructions.   The patient was advised to call back or seek an in-person evaluation if the symptoms worsen or if the condition fails to improve as anticipated.  I provided 12 minutes of face-to-face time during this encounter including median intraservice time, reviewing previous notes, labs, imaging, medications and explaining diagnosis and management.  Claiborne Rigg, FNP-BC

## 2023-02-27 ENCOUNTER — Inpatient Hospital Stay: Payer: 59

## 2023-02-27 ENCOUNTER — Inpatient Hospital Stay: Payer: 59 | Admitting: Genetic Counselor

## 2023-03-04 DIAGNOSIS — J45998 Other asthma: Secondary | ICD-10-CM | POA: Diagnosis not present

## 2023-03-12 ENCOUNTER — Ambulatory Visit: Payer: Self-pay | Admitting: *Deleted

## 2023-03-12 NOTE — Telephone Encounter (Signed)
  Chief Complaint: requesting assistance to choose "foot care plan" at [employer] [facility] for right foot pain and swelling  Symptoms: pain in right foot severe, right foot swelling .  Frequency: na  Pertinent Negatives: Patient denies na  Disposition: [] ED /[] Urgent Care (no appt availability in office) / [] Appointment(In office/virtual)/ []  [facility]/ [x] Home Care/ [] Refused Recommended Disposition /[] [facility]/ []  Follow-up with PCP Additional Notes:   Patient requesting call back in am to review what PCP recommended for right foot swelling and pain . Patient going to [employer] store and needs to buy "a plan" for her type of pain and reports PCP is aware of what she may need. Please advise and patient requesting call back.        Reason for Disposition  Properly fitting shoes, questions about  Answer Assessment - Initial Assessment Questions 1. ONSET: "When did the pain start?"      On going  2. LOCATION: "Where is the pain located?"      Right foot  3. PAIN: "How bad is the pain?"    (Scale 1-10; or mild, moderate, severe)  - MILD (1-3): doesn't interfere with normal activities.   - MODERATE (4-7): interferes with normal activities (e.g., work or school) or awakens from sleep, limping.   - SEVERE (8-10): excruciating pain, unable to do any normal activities, unable to walk.      Severe pain with walking  4. WORK OR EXERCISE: "Has there been any recent work or exercise that involved this part of the body?"      Na  5. CAUSE: "What do you think is causing the foot pain?"     na 6. OTHER SYMPTOMS: "Do you have any other symptoms?" (e.g., leg pain, rash, fever, numbness)     Hx sickle cell and right foot swollen and pain  7. PREGNANCY: "Is there any chance you are pregnant?" "When was your last menstrual period?"     na  Protocols used: Foot Pain-A-AH

## 2023-03-13 ENCOUNTER — Ambulatory Visit: Payer: Self-pay

## 2023-03-13 NOTE — Telephone Encounter (Signed)
  Chief Complaint: Medication question form pharmacy Symptoms:  Frequency:  Pertinent Negatives: Patient denies  Disposition: [] ED /[] Urgent Care (no appt availability in office) / [] Appointment(In office/virtual)/ []  [facility]/ [] Home Care/ [] Refused Recommended Disposition /[] [facility]/ [x]  Follow-up with PCP Additional Notes: Pharmacy needs clarification.     Summary: directions for symbacor   Charmaine from [facility] pharmacy called in to make sure the directions are correct for symbacor says inhale 2 puffs in lungs once for one dose.     Reason for Disposition  [1] Caller has URGENT medicine question about med that PCP or specialist prescribed AND [2] triager unable to answer question  Answer Assessment - Initial Assessment Questions 1. NAME of MEDICINE: "What medicine(s) are you calling about?"     Symbicort 2. QUESTION: "What is your question?" (e.g., double dose of medicine, side effect)     Please clarify sig.  Protocols used: Medication Question Call-A-AH

## 2023-03-13 NOTE — Telephone Encounter (Signed)
Routing  to PCP for Clarification.

## 2023-03-14 ENCOUNTER — Other Ambulatory Visit: Payer: Self-pay

## 2023-03-14 ENCOUNTER — Emergency Department (HOSPITAL_COMMUNITY)
Admission: EM | Admit: 2023-03-14 | Discharge: 2023-03-14 | Disposition: A | Discharge status: Home / Self Care | Payer: 59 | Attending: Emergency Medicine | Admitting: Emergency Medicine

## 2023-03-14 ENCOUNTER — Other Ambulatory Visit: Payer: Self-pay | Admitting: Nurse Practitioner

## 2023-03-14 ENCOUNTER — Emergency Department (HOSPITAL_COMMUNITY): Payer: 59

## 2023-03-14 DIAGNOSIS — M7989 Other specified soft tissue disorders: Secondary | ICD-10-CM | POA: Insufficient documentation

## 2023-03-14 DIAGNOSIS — Z7984 Long term (current) use of oral hypoglycemic drugs: Secondary | ICD-10-CM | POA: Diagnosis not present

## 2023-03-14 DIAGNOSIS — Z79899 Other long term (current) drug therapy: Secondary | ICD-10-CM | POA: Diagnosis not present

## 2023-03-14 DIAGNOSIS — J45909 Unspecified asthma, uncomplicated: Secondary | ICD-10-CM | POA: Insufficient documentation

## 2023-03-14 DIAGNOSIS — R202 Paresthesia of skin: Secondary | ICD-10-CM | POA: Diagnosis not present

## 2023-03-14 DIAGNOSIS — E119 Type 2 diabetes mellitus without complications: Secondary | ICD-10-CM | POA: Diagnosis not present

## 2023-03-14 DIAGNOSIS — Z7951 Long term (current) use of inhaled steroids: Secondary | ICD-10-CM | POA: Insufficient documentation

## 2023-03-14 DIAGNOSIS — R079 Chest pain, unspecified: Secondary | ICD-10-CM

## 2023-03-14 DIAGNOSIS — K5900 Constipation, unspecified: Secondary | ICD-10-CM | POA: Insufficient documentation

## 2023-03-14 DIAGNOSIS — R0789 Other chest pain: Secondary | ICD-10-CM | POA: Diagnosis not present

## 2023-03-14 DIAGNOSIS — R0602 Shortness of breath: Secondary | ICD-10-CM | POA: Diagnosis not present

## 2023-03-14 DIAGNOSIS — I10 Essential (primary) hypertension: Secondary | ICD-10-CM | POA: Diagnosis not present

## 2023-03-14 DIAGNOSIS — Z7982 Long term (current) use of aspirin: Secondary | ICD-10-CM | POA: Diagnosis not present

## 2023-03-14 DIAGNOSIS — J4520 Mild intermittent asthma, uncomplicated: Secondary | ICD-10-CM

## 2023-03-14 LAB — CBC
HCT: 35.4 % — ABNORMAL LOW (ref 36.0–46.0)
Hemoglobin: 11.6 g/dL — ABNORMAL LOW (ref 12.0–15.0)
MCH: 28.6 pg (ref 26.0–34.0)
MCHC: 32.8 g/dL (ref 30.0–36.0)
MCV: 87.2 fL (ref 80.0–100.0)
Platelets: 299 10*3/uL (ref 150–400)
RBC: 4.06 MIL/uL (ref 3.87–5.11)
RDW: 14.1 % (ref 11.5–15.5)
WBC: 5.6 10*3/uL (ref 4.0–10.5)
nRBC: 0 % (ref 0.0–0.2)

## 2023-03-14 LAB — BASIC METABOLIC PANEL
Anion gap: 9 (ref 5–15)
BUN: 14 mg/dL (ref 6–20)
CO2: 27 mmol/L (ref 22–32)
Calcium: 8.9 mg/dL (ref 8.9–10.3)
Chloride: 107 mmol/L (ref 98–111)
Creatinine, Ser: 0.61 mg/dL (ref 0.44–1.00)
GFR, Estimated: >60 mL/min (ref >60)
Glucose, Bld: 78 mg/dL (ref 70–99)
Potassium: 3.2 mmol/L — ABNORMAL LOW (ref 3.5–5.1)
Sodium: 143 mmol/L (ref 135–145)

## 2023-03-14 LAB — TROPONIN I (HIGH SENSITIVITY)
Troponin I (High Sensitivity): 4 ng/L (ref <18)
Troponin I (High Sensitivity): 4 ng/L (ref <18)

## 2023-03-14 LAB — CBG MONITORING, ED: Glucose-Capillary: 83 mg/dL (ref 70–99)

## 2023-03-14 MED ORDER — FLUTICASONE-SALMETEROL 230-21 MCG/ACT IN AERO: 2.0000 | INHALATION_SPRAY | Freq: Two times a day (BID) | RESPIRATORY_TRACT | 12 refills | Status: DC

## 2023-03-14 NOTE — Telephone Encounter (Signed)
Elevate foot as much as possible. Diet should be low salt. No adding salt to foods and no eating out. Drinking at least 64oz of water per day

## 2023-03-14 NOTE — ED Notes (Signed)
Leads off nurse at bedside

## 2023-03-14 NOTE — ED Triage Notes (Signed)
Pt  BIB [facility] after going to work at 0700. EMS reported patient c/o chest pain and left arm numbness. Patient went to work the symptoms did not bet better or worse. Patient took 324 Aspirin and EMS gave patient Sublingual Nitro 0.4. Vital signs: 140/80, HR 60, RA 99% and Blood sugar 165. Patient is a diabetic and she is A & O x 4.

## 2023-03-14 NOTE — Discharge Instructions (Addendum)
You were seen in the emergency department today for chest pain.  As we discussed your lab work, EKG, chest x-ray all looked reassuring today.  I think that your symptoms could likely been related to stress or indigestion. We did not find ane emergent cause for your symptoms today.   I recommend monitoring your stress levels.  Continue to monitor how you are doing overall, and return to the emergency department for any new or worsening symptoms such as: Worsening pain or pain with exertion, difficulty breathing, sweating, or pain or swelling in your legs.

## 2023-03-14 NOTE — ED Provider Notes (Signed)
EMERGENCY DEPARTMENT AT [facility] Provider Note   CSN: [id number] Arrival date & time: 03/14/23  1119     History  Chief Complaint  Patient presents with   Chest Pain    Melissa Pham is a 57 y.o. female with history of asthma, HTN, diabetes, HLD, bipolar disorder, schizophrenia, ADHD, GERD who presents to the ER complaining of chest pain. Started while driving to work around 7am this morning. She pulled over on the side of the highway and called 911. They encouraged her to come to the ER for eval but she went on to work. Has been constipated all morning. Describes pain in the middle of her chest and lower throat that feels like gas. Intermittent numbness in the left arm. Has been having some intermittent foot swelling, attributes this to her shoes.   EMS reported pt took 324 mg ASA, and they gave hr 0.4 mg nitroglycerin SL.   Chest Pain Associated symptoms: shortness of breath        Home Medications Prior to Admission medications   Medication Sig Start Date End Date Taking? Authorizing Provider  Accu-Chek Softclix Lancets lancets Use as instructed. Check blood glucose level by fingerstick 2 times per day. Patient not taking: Reported on 01/20/2023 09/17/22   Claiborne Rigg, NP  albuterol (PROVENTIL) (2.5 MG/3ML) 0.083% nebulizer solution Take 3 mLs (2.5 mg total) by nebulization every 6 (six) hours as needed for wheezing or shortness of breath. 02/19/23   Claiborne Rigg, NP  Albuterol Sulfate (PROAIR RESPICLICK) 108 (90 Base) MCG/ACT AEPB Inhale 1 puff into the lungs every 6 (six) hours. 02/19/23   Claiborne Rigg, NP  aspirin EC 81 MG tablet Take 81 mg by mouth daily.    [provider]  benzonatate (TESSALON) 100 MG capsule Take 1 capsule (100 mg total) by mouth every 8 (eight) hours. Patient not taking: Reported on 01/20/2023 01/04/23   Barbette Merino, NP  budesonide-formoterol [facility]) 160-4.5 MCG/ACT inhaler Inhale 2 puffs into the lungs  once for 1 dose. 02/19/23 02/19/23  Claiborne Rigg, NP  busPIRone (BUSPAR) 7.5 MG tablet Take 1 tablet (7.5 mg total) by mouth 2 (two) times daily. For anxiety 09/17/22   Claiborne Rigg, NP  diclofenac Sodium (VOLTAREN) 1 % GEL Apply 4 g topically 4 (four) times daily. Patient not taking: Reported on 01/20/2023 02/23/20   Joy, Hillard Danker, PA-C  dicyclomine (BENTYL) 20 MG tablet Take 1 tablet (20 mg total) by mouth 2 (two) times daily. 07/30/22   Smoot, Sarah A, PA-C  gabapentin (NEURONTIN) 300 MG capsule Take 1 capsule (300 mg total) by mouth at bedtime. 09/17/22   Claiborne Rigg, NP  glimepiride (AMARYL) 2 MG tablet Take 1 tablet (2 mg total) by mouth daily with breakfast. 09/17/22   Claiborne Rigg, NP  glucose blood (ACCU-CHEK GUIDE) test strip Use as instructed. Check blood glucose by fingerstick twice per day. 09/17/22   Claiborne Rigg, NP  hydrocortisone (ANUSOL-HC) 2.5 % rectal cream Place 1 Application rectally 2 (two) times daily. 07/29/22   White, Elita Boone, NP  ibuprofen (ADVIL) 400 MG tablet Take 1 tablet (400 mg total) by mouth every 6 (six) hours as needed for moderate pain. 02/14/23   Hoy Register, MD  Lidocaine-Glycerin (PREPARATION H RE) Place 1 application  rectally as needed (hemmroids).    [provider]  lisinopril (ZESTRIL) 5 MG tablet Take 1 tablet (5 mg total) by mouth daily. 09/17/22   Meredeth Ide,  Shea Stakes, NP  meloxicam (MOBIC) 15 MG tablet Take 1 tablet (15 mg total) by mouth daily. 02/19/23   Claiborne Rigg, NP  MELOXICAM PO Take by mouth.    [provider]  methocarbamol (ROBAXIN) 500 MG tablet Take 1 tablet (500 mg total) by mouth 2 (two) times daily. 01/27/23   Prosperi, Christian H, PA-C  metoprolol succinate (TOPROL-XL) 50 MG 24 hr tablet Take 1 tablet (50 mg total) by mouth daily. Take with or immediately following a meal. 09/17/22   Claiborne Rigg, NP  Multiple Vitamins-Minerals (MULTIVITAMIN WITH MINERALS) tablet Take 1 tablet by mouth  daily. Patient not taking: Reported on 11/11/2022    [provider]  Na Sulfate-K Sulfate-Mg Sulf (SUPREP BOWEL PREP KIT) 17.5-3.13-1.6 GM/177ML SOLN Take 1 kit by mouth as directed. Patient not taking: Reported on 01/20/2023 11/11/22   Imogene Burn, MD  nitroGLYCERIN (NITROSTAT) 0.4 MG SL tablet Place 0.4 mg under the tongue every 5 (five) minutes as needed for chest pain. Patient not taking: Reported on 01/20/2023    [provider]  pantoprazole (PROTONIX) 40 MG tablet Take 1 tablet (40 mg total) by mouth daily. 09/17/22   Claiborne Rigg, NP  PARoxetine (PAXIL) 20 MG tablet Take 1 tablet (20 mg total) by mouth daily. Patient not taking: Reported on 01/20/2023 09/17/22   Claiborne Rigg, NP  simvastatin (ZOCOR) 40 MG tablet Take 1 tablet (40 mg total) by mouth daily. 09/17/22   Claiborne Rigg, NP  sucralfate (CARAFATE) 1 g tablet Take 1 tablet (1 g total) by mouth 2 (two) times daily. Patient not taking: Reported on 01/20/2023 03/27/22   Meccariello, Solmon Ice, MD  valACYclovir (VALTREX) 1000 MG tablet TAKE 1 TABLET BY MOUTH THREE TIMES DAILY 02/14/23   Hoy Register, MD      Allergies    Sulfa antibiotics    Review of Systems   Review of Systems  Respiratory:  Positive for shortness of breath.   Cardiovascular:  Positive for chest pain and leg swelling.  All other systems reviewed and are negative.   Physical Exam Updated Vital Signs BP (!) 144/78 (BP Location: Left Arm)   Pulse 62   Temp 98.1 F (36.7 C) (Oral)   Resp (!) 21   Ht 5\' 1"  (1.549 m)   Wt 80.3 kg   LMP 07/21/2013   SpO2 99%   BMI 33.44 kg/m  Physical Exam Vitals and nursing note reviewed.  Constitutional:      Appearance: Normal appearance.  HENT:     Head: Normocephalic and atraumatic.  Eyes:     Conjunctiva/sclera: Conjunctivae normal.  Cardiovascular:     Rate and Rhythm: Normal rate and regular rhythm.     Pulses:          Dorsalis pedis pulses are 2+ on the right side and 2+ on  the left side.  Pulmonary:     Effort: Pulmonary effort is normal. No respiratory distress.     Breath sounds: Normal breath sounds.  Abdominal:     General: There is no distension.     Palpations: Abdomen is soft.     Tenderness: There is no abdominal tenderness.  Musculoskeletal:     Right lower leg: No edema.     Left lower leg: No edema.  Skin:    General: Skin is warm and dry.  Neurological:     General: No focal deficit present.     Mental Status: She is alert.  ED Results / Procedures / Treatments   Labs (all labs ordered are listed, but only abnormal results are displayed) Labs Reviewed  BASIC METABOLIC PANEL - Abnormal; Notable for the following components:      Result Value   Potassium 3.2 (*)    All other components within normal limits  CBC - Abnormal; Notable for the following components:   Hemoglobin 11.6 (*)    HCT 35.4 (*)    All other components within normal limits  CBG MONITORING, ED  TROPONIN I (HIGH SENSITIVITY)  TROPONIN I (HIGH SENSITIVITY)    EKG EKG Interpretation  Date/Time:  Friday Mar 14 2023 12:41:19 EDT Ventricular Rate:  57 PR Interval:  148 QRS Duration: 57 QT Interval:  459 QTC Calculation: 447 R Axis:   79 Text Interpretation: Sinus rhythm No significant change since last tracing Confirmed by Elayne Snare ([id number]) on 03/14/2023 1:17:42 PM  Radiology DG Chest 2 View  Result Date: 03/14/2023 CLINICAL DATA:  Chest pain. EXAM: CHEST - 2 VIEW COMPARISON:  Chest x-ray dated March 01, 2022. FINDINGS: The heart size and mediastinal contours are within normal limits. Both lungs are clear. The visualized skeletal structures are unremarkable. IMPRESSION: No active cardiopulmonary disease. Electronically Signed   By: Obie Dredge M.D.   On: 03/14/2023 12:14    Procedures Procedures    Medications Ordered in ED Medications - No data to display  ED Course/ Medical Decision Making/ A&P             HEART Score: 3                 Medical Decision Making Amount and/or Complexity of Data Reviewed Labs: ordered. Radiology: ordered.   This patient is a 57 y.o. female  who presents to the ED for concern of chest pain.   Differential diagnoses prior to evaluation: The emergent differential diagnosis includes, but is not limited to,  ACS, pericarditis, myocarditis, aortic dissection, PE, pneumothorax, esophageal spasm or rupture, chronic angina, pneumonia, bronchitis, GERD, reflux/PUD, biliary disease, pancreatitis, costochondritis, anxiety. This is not an exhaustive differential.   Past Medical History / Co-morbidities: asthma, HTN, diabetes, HLD, bipolar disorder, schizophrenia, ADHD, GERD  Physical Exam: Physical exam performed. The pertinent findings include: Mildly hypertensive, otherwise normal vital signs.  Lab Tests/Imaging studies: I personally interpreted labs/imaging and the pertinent results include:  no leukocytosis, hemoglobin stable. BMP with K 3.2, otherwise unremarkable. Initial troponin 4, delta troponin 4.  CXR without acute abnormalities. I agree with the radiologist interpretation.  Cardiac monitoring: EKG obtained and interpreted by my attending physician which shows: sinus rhythm   Disposition: After consideration of the diagnostic results and the patients response to treatment, I feel that emergency department workup does not suggest an emergent condition requiring admission or immediate intervention beyond what has been performed at this time. Patient is to be discharged with recommendation to follow up with PCP in regards to today's hospital visit. Chest pain is not likely of cardiac or pulmonary etiology d/t presentation, Well's criteria for PE negative, VSS, no tracheal deviation, no JVD or new murmur, RRR, breath sounds equal bilaterally, EKG without acute abnormalities, negative troponins, and negative CXR. Heart score of 3. Pt has been advised to return to the ED if CP becomes exertional,  associated with diaphoresis or nausea, radiates to left jaw/arm, worsens or becomes concerning in any way. Pt appears reliable for follow up and is agreeable to discharge.   Final Clinical Impression(s) / ED Diagnoses Final diagnoses:  Nonspecific  chest pain    Rx / DC Orders ED Discharge Orders     None      Portions of this report may have been transcribed using voice recognition software. Every effort was made to ensure accuracy; however, inadvertent computerized transcription errors may be present.    Laasia Arcos T, PA-C 03/14/23 1726    Elayne Snare K, DO 03/15/23 [phone]

## 2023-03-17 NOTE — Telephone Encounter (Signed)
Unable to reach patient by phone to relay results.  Voicemail left to return call for response to previous request.

## 2023-03-18 ENCOUNTER — Other Ambulatory Visit: Payer: Self-pay

## 2023-04-03 DIAGNOSIS — J45998 Other asthma: Secondary | ICD-10-CM | POA: Diagnosis not present

## 2023-04-05 ENCOUNTER — Other Ambulatory Visit: Payer: Self-pay | Admitting: Nurse Practitioner

## 2023-04-05 ENCOUNTER — Other Ambulatory Visit: Payer: Self-pay | Admitting: Family Medicine

## 2023-04-05 DIAGNOSIS — E119 Type 2 diabetes mellitus without complications: Secondary | ICD-10-CM

## 2023-04-05 DIAGNOSIS — B009 Herpesviral infection, unspecified: Secondary | ICD-10-CM

## 2023-04-05 DIAGNOSIS — F411 Generalized anxiety disorder: Secondary | ICD-10-CM

## 2023-04-05 DIAGNOSIS — F4310 Post-traumatic stress disorder, unspecified: Secondary | ICD-10-CM

## 2023-04-07 NOTE — Telephone Encounter (Signed)
Requested medication (s) are due for refill today: -  Requested medication (s) are on the active medication list: yes  Last refill:  01/04/23 #21  Future visit scheduled: yes  Notes to clinic:  med not assigned to a protocol - was ordered at Urgent Care   Requested Prescriptions  Pending Prescriptions Disp Refills   benzonatate (TESSALON) 100 MG capsule [Pharmacy Med Name: Benzonatate 100 MG Oral Capsule] 21 capsule 0    Sig: TAKE 1 CAPSULE BY MOUTH EVERY 8 HOURS     There is no refill protocol information for this order

## 2023-04-07 NOTE — Telephone Encounter (Signed)
Requested Prescriptions  Pending Prescriptions Disp Refills   glimepiride (AMARYL) 2 MG tablet [Pharmacy Med Name: Glimepiride 2 MG Oral Tablet] 90 tablet 0    Sig: Take 1 tablet by mouth once daily with breakfast     Endocrinology:  Diabetes - Sulfonylureas Passed - 04/05/2023  6:52 AM      Passed - HBA1C is between 0 and 7.9 and within 180 days    HbA1c, POC (controlled diabetic range)  Date Value Ref Range Status  04/09/2022 6.3 0.0 - 7.0 % Final   Hgb A1c MFr Bld  Date Value Ref Range Status  11/22/2022 6.1 (H) 4.8 - 5.6 % Final    Comment:             Prediabetes: 5.7 - 6.4          Diabetes: >6.4          Glycemic control for adults with diabetes: <7.0          Passed - Cr in normal range and within 360 days    Creatinine, Ser  Date Value Ref Range Status  03/14/2023 0.61 0.44 - 1.00 mg/dL Final         Passed - Valid encounter within last 6 months    Recent Outpatient Visits           1 month ago Mild intermittent asthma without complication   [place] [facility] [place], Shea Stakes, NP   2 months ago Encounter for [employer] annual wellness exam   [facility] [place], [place], NP   4 months ago Encounter for Papanicolaou smear for cervical cancer screening   [facility], Shea Stakes, NP   5 months ago Encounter for medication review and counseling   [facility] [place], Cornelius Moras, RPH-CPP   6 months ago Encounter to establish care   [place] [facility] Claiborne Rigg, NP       Future Appointments             In 1 month Claiborne Rigg, NP [facility]             PARoxetine (PAXIL) 20 MG tablet [Pharmacy Med Name: PARoxetine HCl 20 MG Oral Tablet] 90 tablet 0    Sig: Take 1 tablet by mouth once daily     Psychiatry:  Antidepressants - SSRI Passed - 04/05/2023   6:52 AM      Passed - Valid encounter within last 6 months    Recent Outpatient Visits           1 month ago Mild intermittent asthma without complication   [place] [facility] [place], Shea Stakes, NP   2 months ago Encounter for [employer] annual wellness exam   [facility] [place], [place], NP   4 months ago Encounter for Papanicolaou smear for cervical cancer screening   [facility] [place], Shea Stakes, NP   5 months ago Encounter for medication review and counseling   [facility] [place], Cornelius Moras, RPH-CPP   6 months ago Encounter to establish care   [facility] Claiborne Rigg, NP       Future Appointments  In 1 month Claiborne Rigg, NP [facility]

## 2023-04-07 NOTE — Telephone Encounter (Signed)
Requested Prescriptions  Pending Prescriptions Disp Refills   valACYclovir (VALTREX) 1000 MG tablet [Pharmacy Med Name: valACYclovir HCl 1 GM Oral Tablet] 270 tablet 0    Sig: TAKE 1 TABLET BY MOUTH THREE TIMES DAILY     Antimicrobials:  Antiviral Agents - Anti-Herpetic Passed - 04/05/2023  6:52 AM      Passed - Valid encounter within last 12 months    Recent Outpatient Visits           1 month ago Mild intermittent asthma without complication   [place] [facility] [place], Shea Stakes, NP   2 months ago Encounter for [employer] annual wellness exam   [facility] [place], Shea Stakes, NP   4 months ago Encounter for Papanicolaou smear for cervical cancer screening   [facility] [place], Shea Stakes, NP   5 months ago Encounter for medication review and counseling   [facility] [place], Cornelius Moras, RPH-CPP   6 months ago Encounter to establish care   [facility] Claiborne Rigg, NP       Future Appointments             In 1 month Claiborne Rigg, NP [facility]

## 2023-04-28 ENCOUNTER — Telehealth: Payer: Self-pay | Admitting: Nurse Practitioner

## 2023-04-28 NOTE — Telephone Encounter (Signed)
Copied from CRM [id number]. Topic: General - Inquiry >> Apr 28, 2023 12:00 PM Marlow Baars wrote: Reason for CRM: The patient called in requesting a copy of her written medical statement. She states it is only one page and her provider should have it on file as she filled it out. Please assist patient further

## 2023-04-28 NOTE — Telephone Encounter (Signed)
Message sent through my chart

## 2023-05-03 ENCOUNTER — Encounter (HOSPITAL_COMMUNITY): Payer: Self-pay

## 2023-05-03 ENCOUNTER — Emergency Department (HOSPITAL_COMMUNITY): Payer: 59

## 2023-05-03 ENCOUNTER — Other Ambulatory Visit: Payer: Self-pay

## 2023-05-03 ENCOUNTER — Emergency Department (HOSPITAL_COMMUNITY)
Admission: EM | Admit: 2023-05-03 | Discharge: 2023-05-03 | Disposition: A | Discharge status: Home / Self Care | Payer: 59 | Attending: Emergency Medicine | Admitting: Emergency Medicine

## 2023-05-03 DIAGNOSIS — R200 Anesthesia of skin: Secondary | ICD-10-CM | POA: Diagnosis not present

## 2023-05-03 DIAGNOSIS — F17210 Nicotine dependence, cigarettes, uncomplicated: Secondary | ICD-10-CM | POA: Diagnosis not present

## 2023-05-03 DIAGNOSIS — Z7984 Long term (current) use of oral hypoglycemic drugs: Secondary | ICD-10-CM | POA: Insufficient documentation

## 2023-05-03 DIAGNOSIS — R202 Paresthesia of skin: Secondary | ICD-10-CM | POA: Insufficient documentation

## 2023-05-03 DIAGNOSIS — J45909 Unspecified asthma, uncomplicated: Secondary | ICD-10-CM | POA: Diagnosis not present

## 2023-05-03 DIAGNOSIS — I10 Essential (primary) hypertension: Secondary | ICD-10-CM | POA: Insufficient documentation

## 2023-05-03 DIAGNOSIS — R0789 Other chest pain: Secondary | ICD-10-CM | POA: Insufficient documentation

## 2023-05-03 DIAGNOSIS — Z7982 Long term (current) use of aspirin: Secondary | ICD-10-CM | POA: Diagnosis not present

## 2023-05-03 DIAGNOSIS — Z79899 Other long term (current) drug therapy: Secondary | ICD-10-CM | POA: Insufficient documentation

## 2023-05-03 DIAGNOSIS — E119 Type 2 diabetes mellitus without complications: Secondary | ICD-10-CM | POA: Insufficient documentation

## 2023-05-03 DIAGNOSIS — R079 Chest pain, unspecified: Secondary | ICD-10-CM | POA: Diagnosis not present

## 2023-05-03 DIAGNOSIS — I2510 Atherosclerotic heart disease of native coronary artery without angina pectoris: Secondary | ICD-10-CM | POA: Insufficient documentation

## 2023-05-03 LAB — BASIC METABOLIC PANEL WITH GFR
Anion gap: 10 (ref 5–15)
BUN: 7 mg/dL (ref 6–20)
CO2: 25 mmol/L (ref 22–32)
Calcium: 9.3 mg/dL (ref 8.9–10.3)
Chloride: 106 mmol/L (ref 98–111)
Creatinine, Ser: 0.7 mg/dL (ref 0.44–1.00)
GFR, Estimated: >60 mL/min
Glucose, Bld: 137 mg/dL — ABNORMAL HIGH (ref 70–99)
Potassium: 3.2 mmol/L — ABNORMAL LOW (ref 3.5–5.1)
Sodium: 141 mmol/L (ref 135–145)

## 2023-05-03 LAB — CBC
HCT: 37.3 % (ref 36.0–46.0)
Hemoglobin: 12.2 g/dL (ref 12.0–15.0)
MCH: 27.9 pg (ref 26.0–34.0)
MCHC: 32.7 g/dL (ref 30.0–36.0)
MCV: 85.2 fL (ref 80.0–100.0)
Platelets: 265 10*3/uL (ref 150–400)
RBC: 4.38 MIL/uL (ref 3.87–5.11)
RDW: 13.5 % (ref 11.5–15.5)
WBC: 5.1 10*3/uL (ref 4.0–10.5)
nRBC: 0 % (ref 0.0–0.2)

## 2023-05-03 LAB — TROPONIN I (HIGH SENSITIVITY)
Troponin I (High Sensitivity): 4 ng/L (ref <18)
Troponin I (High Sensitivity): 4 ng/L

## 2023-05-03 MED ORDER — POTASSIUM CHLORIDE CRYS ER 20 MEQ PO TBCR
40.0000 meq | EXTENDED_RELEASE_TABLET | Freq: Once | ORAL | Status: AC
  Administered 2023-05-03: 40 meq via ORAL
  Filled 2023-05-03: qty 2

## 2023-05-03 NOTE — Discharge Instructions (Signed)
We evaluated you for your numbness and your chest pain.  Your testing was reassuring including normal heart testing and a normal CT scan of your head.  We do not think your symptoms are due to a dangerous cause like a heart attack or stroke.  Please follow-up closely with a cardiologist.  I see you have an appointment July 12, we have placed a referral to see if they can see you any sooner.  I would also like you to follow-up with a neurologist, please call [facility] for a neurology appointment so they can get further tests to evaluate your numbness and tingling.  Please return to the emergency department if you develop any new or worsening symptoms such as severe pain, difficulty breathing, uncontrolled vomiting, abdominal pain, fainting, trouble speaking, trouble swallowing, weakness on one side of your body of the other, or any other new symptoms.

## 2023-05-03 NOTE — ED Provider Notes (Signed)
EMERGENCY DEPARTMENT AT [facility] Provider Note  CSN: [id number] Arrival date & time: 05/03/23 1050  Chief Complaint(s) Chest Pain and Numbness  HPI Melissa Pham is a 57 y.o. female with history of bipolar disorder, coronary artery disease, diabetes, schizophrenia, hypertension, hyperlipidemia presenting to the emergency department with chest pain and numbness.  Patient reports that she has had numbness for the past 3 weeks.  She reports it is about the right leg and right arm.  Worse in the right leg.  No facial or torso numbness.  No weakness.  No dizziness, trouble swallowing, trouble speaking, visual changes, facial droop.  She is walking normally.  She also reports chest pain which began yesterday, reports it is not pleuritic, not exertional.  She denies any shortness of breath.  She reports chronic unchanged cough which is nonproductive.  No syncope or lightheadedness.  No palpitations.  No nausea or vomiting.  No diaphoresis.   Past Medical History Past Medical History:  Diagnosis Date   ADHD (attention deficit hyperactivity disorder)    Anxiety    Asthma    Bipolar disorder (HCC)    Blood transfusion without reported diagnosis 11/05/1983   after childbirth   Coronary artery disease    Depression    Diabetes mellitus without complication (HCC)    GERD (gastroesophageal reflux disease)    History of illicit drug use    Hyperlipidemia    Hypertension    Schizophrenia (HCC)    Sickle cell trait (HCC)    Tobacco use    Patient Active Problem List   Diagnosis Date Noted   Syncope 12/06/2018   CAD in native artery    Tobacco abuse 07/27/2018   Pure hypercholesterolemia    Generalized abdominal discomfort 12/10/2017   Effusion, right knee 03/13/2017   Sprain of right knee 03/13/2017   Chest pain 02/20/2015   Suprapubic pain 02/20/2015   Diarrhea 02/20/2015   Diabetes mellitus, type 2 (HCC) 02/20/2015   HTN (hypertension) 02/20/2015    Dyslipidemia 02/20/2015   Bipolar affective disorder (HCC) 02/20/2015   Schizophrenia (HCC) 02/20/2015   Hypokalemia 02/20/2015   Home Medication(s) Prior to Admission medications   Medication Sig Start Date End Date Taking? Authorizing Provider  Accu-Chek Softclix Lancets lancets Use as instructed. Check blood glucose level by fingerstick 2 times per day. Patient not taking: Reported on 01/20/2023 09/17/22   Claiborne Rigg, NP  albuterol (PROVENTIL) (2.5 MG/3ML) 0.083% nebulizer solution Take 3 mLs (2.5 mg total) by nebulization every 6 (six) hours as needed for wheezing or shortness of breath. 02/19/23   Claiborne Rigg, NP  Albuterol Sulfate (PROAIR RESPICLICK) 108 (90 Base) MCG/ACT AEPB Inhale 1 puff into the lungs every 6 (six) hours. 02/19/23   Claiborne Rigg, NP  aspirin EC 81 MG tablet Take 81 mg by mouth daily.    [provider]  benzonatate (TESSALON) 100 MG capsule TAKE 1 CAPSULE BY MOUTH EVERY 8 HOURS 04/07/23   Claiborne Rigg, NP  busPIRone (BUSPAR) 7.5 MG tablet Take 1 tablet (7.5 mg total) by mouth 2 (two) times daily. For anxiety 09/17/22   Claiborne Rigg, NP  diclofenac Sodium (VOLTAREN) 1 % GEL Apply 4 g topically 4 (four) times daily. Patient not taking: Reported on 01/20/2023 02/23/20   Joy, Hillard Danker, PA-C  dicyclomine (BENTYL) 20 MG tablet Take 1 tablet (20 mg total) by mouth 2 (two) times daily. 07/30/22   Smoot, Shawn Route, PA-C  fluticasone-salmeterol (ADVAIR HFA) 230-21 MCG/ACT  inhaler Inhale 2 puffs into the lungs 2 (two) times daily. 03/14/23   Claiborne Rigg, NP  gabapentin (NEURONTIN) 300 MG capsule Take 1 capsule (300 mg total) by mouth at bedtime. 09/17/22   Claiborne Rigg, NP  glimepiride (AMARYL) 2 MG tablet Take 1 tablet by mouth once daily with breakfast 04/07/23   Claiborne Rigg, NP  glucose blood (ACCU-CHEK GUIDE) test strip Use as instructed. Check blood glucose by fingerstick twice per day. 09/17/22   Claiborne Rigg, NP  hydrocortisone  (ANUSOL-HC) 2.5 % rectal cream Place 1 Application rectally 2 (two) times daily. 07/29/22   White, Elita Boone, NP  ibuprofen (ADVIL) 400 MG tablet Take 1 tablet (400 mg total) by mouth every 6 (six) hours as needed for moderate pain. 02/14/23   Hoy Register, MD  Lidocaine-Glycerin (PREPARATION H RE) Place 1 application  rectally as needed (hemmroids).    [provider]  lisinopril (ZESTRIL) 5 MG tablet Take 1 tablet (5 mg total) by mouth daily. 09/17/22   Claiborne Rigg, NP  meloxicam (MOBIC) 15 MG tablet Take 1 tablet (15 mg total) by mouth daily. 02/19/23   Claiborne Rigg, NP  MELOXICAM PO Take by mouth.    [provider]  methocarbamol (ROBAXIN) 500 MG tablet Take 1 tablet (500 mg total) by mouth 2 (two) times daily. 01/27/23   Prosperi, Christian H, PA-C  metoprolol succinate (TOPROL-XL) 50 MG 24 hr tablet Take 1 tablet (50 mg total) by mouth daily. Take with or immediately following a meal. 09/17/22   Claiborne Rigg, NP  Multiple Vitamins-Minerals (MULTIVITAMIN WITH MINERALS) tablet Take 1 tablet by mouth daily. Patient not taking: Reported on 11/11/2022    [provider]  Na Sulfate-K Sulfate-Mg Sulf (SUPREP BOWEL PREP KIT) 17.5-3.13-1.6 GM/177ML SOLN Take 1 kit by mouth as directed. Patient not taking: Reported on 01/20/2023 11/11/22   Imogene Burn, MD  nitroGLYCERIN (NITROSTAT) 0.4 MG SL tablet Place 0.4 mg under the tongue every 5 (five) minutes as needed for chest pain. Patient not taking: Reported on 01/20/2023    [provider]  pantoprazole (PROTONIX) 40 MG tablet Take 1 tablet (40 mg total) by mouth daily. 09/17/22   Claiborne Rigg, NP  PARoxetine (PAXIL) 20 MG tablet Take 1 tablet by mouth once daily 04/07/23   Claiborne Rigg, NP  simvastatin (ZOCOR) 40 MG tablet Take 1 tablet (40 mg total) by mouth daily. 09/17/22   Claiborne Rigg, NP  sucralfate (CARAFATE) 1 g tablet Take 1 tablet (1 g total) by mouth 2 (two) times daily. Patient not  taking: Reported on 01/20/2023 03/27/22   Meccariello, Solmon Ice, MD  valACYclovir (VALTREX) 1000 MG tablet TAKE 1 TABLET BY MOUTH THREE TIMES DAILY 04/07/23   Claiborne Rigg, NP  Past Surgical History Past Surgical History:  Procedure Laterality Date   CERVICAL BIOPSY  W/ LOOP ELECTRODE EXCISION     left eye surgery  1970   TUBAL LIGATION  1989   Family History Family History  Problem Relation Age of Onset   Hyperlipidemia Mother    Arthritis Mother    Parkinson's disease Mother    Heart attack Father    Diabetes Father    Hypertension Father    Hyperlipidemia Father    Cancer Sister        cervical   Breast cancer Sister 73   Breast cancer Sister    Colon cancer Maternal Grandmother 67   Breast cancer Paternal Aunt    Kidney disease Other    Stroke Other    Pancreatic disease Other    Sickle cell anemia Other    Esophageal cancer Neg Hx    Rectal cancer Neg Hx     Social History Social History   Tobacco Use   Smoking status: Every Day    Packs/day: 0.25    Years: 4.00    Additional pack years: 0.00    Total pack years: 1.00    Types: Cigarettes   Smokeless tobacco: Never  Vaping Use   Vaping Use: Never used  Substance Use Topics   Alcohol use: No    Alcohol/week: 0.0 standard drinks of alcohol   Drug use: No    Comment: no drug use for 7 years   Allergies Sulfa antibiotics  Review of Systems Review of Systems  All other systems reviewed and are negative.   Physical Exam Vital Signs  I have reviewed the triage vital signs BP 111/85   Pulse 71   Temp 98.1 F (36.7 C) (Oral)   Resp 18   Ht 5\' 1"  (1.549 m)   Wt 80.3 kg   LMP 07/21/2013   SpO2 100%   BMI 33.44 kg/m  Physical Exam Vitals and nursing note reviewed.  Constitutional:      General: She is not in acute distress.    Appearance: She is well-developed.   HENT:     Head: Normocephalic and atraumatic.     Mouth/Throat:     Mouth: Mucous membranes are moist.  Eyes:     Pupils: Pupils are equal, round, and reactive to light.  Cardiovascular:     Rate and Rhythm: Normal rate and regular rhythm.     Heart sounds: No murmur heard. Pulmonary:     Effort: Pulmonary effort is normal. No respiratory distress.     Breath sounds: Normal breath sounds.  Abdominal:     General: Abdomen is flat.     Palpations: Abdomen is soft.     Tenderness: There is no abdominal tenderness.  Musculoskeletal:        General: No tenderness.     Right lower leg: No edema.     Left lower leg: No edema.  Skin:    General: Skin is warm and dry.  Neurological:     General: No focal deficit present.     Mental Status: She is alert. Mental status is at baseline.     Comments: Cranial nerves II through XII intact, strength 5 out of 5 in the bilateral upper and lower extremities, no sensory deficit to light touch, no dysmetria on finger-nose-finger testing, ambulatory with steady gait.  Psychiatric:        Mood and Affect: Mood normal.        Behavior: Behavior normal.  ED Results and Treatments Labs (all labs ordered are listed, but only abnormal results are displayed) Labs Reviewed  BASIC METABOLIC PANEL - Abnormal; Notable for the following components:      Result Value   Potassium 3.2 (*)    Glucose, Bld 137 (*)    All other components within normal limits  CBC  TROPONIN I (HIGH SENSITIVITY)  TROPONIN I (HIGH SENSITIVITY)                                                                                                                          Radiology CT Head Wo Contrast  Result Date: 05/03/2023 CLINICAL DATA:  Extremity numbness.  Stroke suspected. EXAM: CT HEAD WITHOUT CONTRAST TECHNIQUE: Contiguous axial images were obtained from the base of the skull through the vertex without intravenous contrast. RADIATION DOSE REDUCTION: This exam was  performed according to the departmental dose-optimization program which includes automated exposure control, adjustment of the mA and/or kV according to patient size and/or use of iterative reconstruction technique. COMPARISON:  01/27/2023 FINDINGS: Brain: No evidence of acute infarction, hemorrhage, hydrocephalus, extra-axial collection or mass lesion/mass effect. Vascular: No hyperdense vessel or unexpected calcification. Skull: Normal. Negative for fracture or focal lesion. Sinuses/Orbits: No acute finding. Other: None. IMPRESSION: Normal head CT. Electronically Signed   By: Elberta Fortis M.D.   On: 05/03/2023 14:47   DG Chest 2 View  Result Date: 05/03/2023 CLINICAL DATA:  Chest pain.  Coronary artery disease.  Asthma. EXAM: CHEST - 2 VIEW COMPARISON:  03/14/2023 FINDINGS: The heart size and mediastinal contours are within normal limits. Both lungs are clear. The visualized skeletal structures are unremarkable. IMPRESSION: No active cardiopulmonary disease. Electronically Signed   By: Danae Orleans M.D.   On: 05/03/2023 12:04    Pertinent labs & imaging results that were available during my care of the patient were reviewed by me and considered in my medical decision making (see MDM for details).  Medications Ordered in ED Medications  potassium chloride SA (KLOR-CON M) CR tablet 40 mEq (has no administration in time range)                                                                                                                                     Procedures Procedures  (including critical care time)  Medical Decision Making / ED Course   MDM:  57 year old female presenting with numbness  and chest pain.  Triage nursing note reported that patient had said numbness for 5 days but patient tells me she has had this for 3 weeks.  The patient has a normal neurologic exam without appreciable focal neurologic deficit.  Obtain CT scan given comorbidities to evaluate for missed stroke,  appears normal.  Also no evidence of intracranial process such as bleeding, tumor.  Considered process such as sciatica but patient also reports arm symptoms.  No neck pain to suggest spinal process.  Advise follow-up with neurology for further management.  Discussed stroke symptoms but patient should return immediately to the emergency department for.  She also reports chest pain.  She denies any other significant symptoms that would be concerning such as nausea or vomiting, lightheadedness or fainting, palpitations, diaphoresis.  EKG reassuring.  Troponin negative x 2.  Chest x-ray clear.  Extremely low concern for ACS, pulmonary embolism, pneumothorax, pneumonia, dissection, esophageal rupture or other dangerous pathology.  She reports that her symptoms are significantly improved without medication.  Patient declined medication in the emergency department.  She reports that she has close cardiology follow-up but placed cardiology referral to help expedite this.         Lab Tests: -I ordered, reviewed, and interpreted labs.   The pertinent results include:   Labs Reviewed  BASIC METABOLIC PANEL - Abnormal; Notable for the following components:      Result Value   Potassium 3.2 (*)    Glucose, Bld 137 (*)    All other components within normal limits  CBC  TROPONIN I (HIGH SENSITIVITY)  TROPONIN I (HIGH SENSITIVITY)    Notable for normal troponin x2. Mild hypokalemia  EKG   EKG Interpretation Date/Time:  Saturday May 03 2023 10:32:18 EDT Ventricular Rate:  73 PR Interval:  140 QRS Duration:  64 QT Interval:  380 QTC Calculation: 418 R Axis:   62  Text Interpretation: Normal sinus rhythm Normal ECG Confirmed by Alvino Blood ([id number]) on 05/03/2023 1:17:46 PM         Imaging Studies ordered: I ordered imaging studies including CXR On my interpretation imaging demonstrates no acute process I independently visualized and interpreted imaging. I agree with the radiologist  interpretation   Medicines ordered and prescription drug management: Meds ordered this encounter  Medications   potassium chloride SA (KLOR-CON M) CR tablet 40 mEq    -I have reviewed the patients home medicines and have made adjustments as needed    Cardiac Monitoring: The patient was maintained on a cardiac monitor.  I personally viewed and interpreted the cardiac monitored which showed an underlying rhythm of: NSR  Social Determinants of Health:  Diagnosis or treatment significantly limited by social determinants of health: current smoker   Reevaluation: After the interventions noted above, I reevaluated the patient and found that their symptoms have improved  Co morbidities that complicate the patient evaluation  Past Medical History:  Diagnosis Date   ADHD (attention deficit hyperactivity disorder)    Anxiety    Asthma    Bipolar disorder (HCC)    Blood transfusion without reported diagnosis 11/05/1983   after childbirth   Coronary artery disease    Depression    Diabetes mellitus without complication (HCC)    GERD (gastroesophageal reflux disease)    History of illicit drug use    Hyperlipidemia    Hypertension    Schizophrenia (HCC)    Sickle cell trait (HCC)    Tobacco use       Dispostion: Disposition  decision including need for hospitalization was considered, and patient discharged from emergency department.    Final Clinical Impression(s) / ED Diagnoses Final diagnoses:  Atypical chest pain  Paresthesia     This chart was dictated using voice recognition software.  Despite best efforts to proofread,  errors can occur which can change the documentation meaning.    Lonell Grandchild, MD 05/03/23 [phone]

## 2023-05-03 NOTE — ED Triage Notes (Addendum)
Pt c/o central chest tightness starting last night, intermittent R arm and R leg numbness x5 days, and intermittent R foot swelling x months.  Pain score 7/10.  Pt reports having LLE [place] in 08/2022.     Facial symmetry noted.  Denies weakness.  Pt was able to walk into department w/o difficulty.

## 2023-05-04 DIAGNOSIS — J45998 Other asthma: Secondary | ICD-10-CM | POA: Diagnosis not present

## 2023-05-06 ENCOUNTER — Ambulatory Visit: Payer: 59

## 2023-05-07 ENCOUNTER — Encounter: Payer: Self-pay | Admitting: Neurology

## 2023-05-09 ENCOUNTER — Ambulatory Visit
Admission: RE | Admit: 2023-05-09 | Discharge: 2023-05-09 | Disposition: A | Discharge status: Home / Self Care | Payer: 59 | Source: Ambulatory Visit | Attending: Nurse Practitioner | Admitting: Nurse Practitioner

## 2023-05-09 DIAGNOSIS — Z1231 Encounter for screening mammogram for malignant neoplasm of breast: Secondary | ICD-10-CM

## 2023-05-12 ENCOUNTER — Encounter: Payer: Self-pay | Admitting: Neurology

## 2023-05-12 ENCOUNTER — Ambulatory Visit (INDEPENDENT_AMBULATORY_CARE_PROVIDER_SITE_OTHER): Payer: 59 | Admitting: Neurology

## 2023-05-12 VITALS — BP 119/75 | HR 74 | Ht 62.0 in | Wt 171.0 lb

## 2023-05-12 DIAGNOSIS — R200 Anesthesia of skin: Secondary | ICD-10-CM

## 2023-05-12 DIAGNOSIS — G8929 Other chronic pain: Secondary | ICD-10-CM

## 2023-05-12 DIAGNOSIS — M5441 Lumbago with sciatica, right side: Secondary | ICD-10-CM

## 2023-05-12 DIAGNOSIS — M5442 Lumbago with sciatica, left side: Secondary | ICD-10-CM
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
## 2023-05-12 NOTE — Patient Instructions (Addendum)
Nerve testing of the right leg  Start physical therapy for low back strengthening and stretching  ELECTROMYOGRAM AND NERVE CONDUCTION STUDIES (EMG/NCS) INSTRUCTIONS  How to Prepare The neurologist conducting the EMG will need to know if you have certain medical conditions. Tell the neurologist and other EMG lab personnel if you: Have a pacemaker or any other electrical medical device Take blood-thinning medications Have hemophilia, a blood-clotting disorder that causes prolonged bleeding Bathing Take a shower or bath shortly before your exam in order to remove oils from your skin. Don't apply lotions or creams before the exam.  What to Expect You'll likely be asked to change into a hospital gown for the procedure and lie down on an examination table. The following explanations can help you understand what will happen during the exam.  Electrodes. The neurologist or a technician places surface electrodes at various locations on your skin depending on where you're experiencing symptoms. Or the neurologist may insert needle electrodes at different sites depending on your symptoms.  Sensations. The electrodes will at times transmit a tiny electrical current that you may feel as a twinge or spasm. The needle electrode may cause discomfort or pain that usually ends shortly after the needle is removed. If you are concerned about discomfort or pain, you may want to talk to the neurologist about taking a short break during the exam.  Instructions. During the needle EMG, the neurologist will assess whether there is any spontaneous electrical activity when the muscle is at rest - activity that isn't present in healthy muscle tissue - and the degree of activity when you slightly contract the muscle.  He or she will give you instructions on resting and contracting a muscle at appropriate times. Depending on what muscles and nerves the neurologist is examining, he or she may ask you to change positions during  the exam.  After your EMG You may experience some temporary, minor bruising where the needle electrode was inserted into your muscle. This bruising should fade within several days. If it persists, contact your primary care doctor.

## 2023-05-13 ENCOUNTER — Other Ambulatory Visit: Payer: Self-pay | Admitting: Nurse Practitioner

## 2023-05-13 DIAGNOSIS — R928 Other abnormal and inconclusive findings on diagnostic imaging of breast: Secondary | ICD-10-CM

## 2023-05-15 ENCOUNTER — Ambulatory Visit (INDEPENDENT_AMBULATORY_CARE_PROVIDER_SITE_OTHER): Payer: 59 | Admitting: Neurology

## 2023-05-15 DIAGNOSIS — M5442 Lumbago with sciatica, left side: Secondary | ICD-10-CM

## 2023-05-15 DIAGNOSIS — M5441 Lumbago with sciatica, right side: Secondary | ICD-10-CM

## 2023-05-15 DIAGNOSIS — R200 Anesthesia of skin: Secondary | ICD-10-CM

## 2023-05-15 DIAGNOSIS — M5417 Radiculopathy, lumbosacral region: Secondary | ICD-10-CM

## 2023-05-15 DIAGNOSIS — G8929 Other chronic pain: Secondary | ICD-10-CM | POA: Diagnosis not present

## 2023-05-15 MED ORDER — CYCLOBENZAPRINE HCL 5 MG PO TABS: 5.0000 mg | ORAL_TABLET | Freq: Every day | ORAL | 3 refills | Status: DC

## 2023-05-15 NOTE — Procedures (Signed)
  [facility] Neurology  [address], [address]  [place], [place] [address] Tel: [phone] Fax: [phone] Test Date:  05/15/2023  Patient: Melissa Pham DOB: Mar 25, 1966 Physician: Nita Sickle, DO  Sex: Female Height: 5\' 2"  Ref Phys: Nita Sickle, DO  ID#: [id number]   Technician:    History: This is a 57 year old female referred for evaluation of right foot numbness and muscle cramps.  NCV & EMG Findings: Extensive electrodiagnostic testing of the right lower extremity shows:  Right sural and superficial peroneal sensory responses are within normal limits. Right peroneal and tibial motor responses are within normal limits. Right tibial H reflex study is within normal limits.   Chronic motor axon loss changes are seen affecting the right L5 myotome, without accompanying active denervation.  Impression: Chronic L5 radiculopathy affecting the right lower extremity, mild. There is no evidence of a large fiber sensorimotor polyneuropathy affecting the lower extremities.   ___________________________ Nita Sickle, DO    Nerve Conduction Studies   Stim Site NR Peak (ms) Norm Peak (ms) O-P Amp (V) Norm O-P Amp  Right Sup Peroneal Anti Sensory (Ant Lat Mall)  32 C  12 cm    1.7 <4.6 17.4 >4  Right Sural Anti Sensory (Lat Mall)  32 C  Calf    1.8 <4.6 39.8 >4     Stim Site NR Onset (ms) Norm Onset (ms) O-P Amp (mV) Norm O-P Amp Site1 Site2 Delta-0 (ms) Dist (cm) Vel (m/s) Norm Vel (m/s)  Right Peroneal Motor (Ext Dig Brev)  32 C  Ankle    2.4 <6.0 3.7 >2.5 B Fib Ankle 6.7 34.0 51 >40  B Fib    9.1  3.3  Poplt B Fib 1.4 7.0 50 >40  Poplt    10.5  3.0         Right Tibial Motor (Abd Hall Brev)  32 C  Ankle    2.7 <6.0 18.3 >4 Knee Ankle 7.2 39.0 54 >40  Knee    9.9  12.5          Electromyography   Side Muscle Ins.Act Fibs Fasc Recrt Amp Dur Poly Activation Comment  Right AntTibialis Nml Nml Nml *1- *1+ *1+ *1+ Nml N/A  Right Gastroc Nml Nml Nml Nml Nml Nml  Nml Nml N/A  Right Flex Dig Long Nml Nml Nml *1- *1+ *1+ *1+ Nml N/A  Right RectFemoris Nml Nml Nml Nml Nml Nml Nml Nml N/A  Right BicepsFemS Nml Nml Nml Nml Nml Nml Nml Nml N/A  Right GluteusMed Nml Nml Nml *1- *1+ *1+ *1+ Nml N/A      Waveforms:
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
## 2023-05-15 NOTE — Progress Notes (Deleted)
Office Visit    Patient Name: Melissa Pham Date of Encounter: 05/15/2023  Primary Care Provider:  Claiborne Rigg, NP Primary Cardiologist:  None  Chief Complaint  57 year old female with past medical history of ongoing tobacco abuse, hypertension, diabetes, hyperlipidemia, schizophrenia and PTSD.  She presents today for follow-up regarding atypical chest pain. Past Medical History    Past Medical History:  Diagnosis Date   ADHD (attention deficit hyperactivity disorder)    Anxiety    Asthma    Bipolar disorder (HCC)    Blood transfusion without reported diagnosis 11/05/1983   after childbirth   Coronary artery disease    Depression    Diabetes mellitus without complication (HCC)    GERD (gastroesophageal reflux disease)    History of illicit drug use    Hyperlipidemia    Hypertension    Schizophrenia (HCC)    Sickle cell trait (HCC)    Tobacco use    Past Surgical History:  Procedure Laterality Date   CERVICAL BIOPSY  W/ LOOP ELECTRODE EXCISION     left eye surgery  1970   TUBAL LIGATION  1989  Allergies Allergies  Allergen Reactions   Sulfa Antibiotics Anaphylaxis   Labs/Other Studies Reviewed   The following studies were reviewed today: Cardiac Studies & Procedures     STRESS TESTS  MYOCARDIAL PERFUSION IMAGING 12/31/2018  Narrative  Nuclear stress EF: 68%.  There was no ST segment deviation noted during stress.  Defect 1: There is a medium defect of mild severity present in the basal anterior, mid anterior and apical anterior location, which is fixed.  This is a low risk study.  The left ventricular ejection fraction is hyperdynamic (>65%).  Medium size, mild perfusion defect in the anterior wall from base to apex is fixed. Likely secondary to breast attenuation with normal EF and normal wall motion in this region. No ischemia noted on perfusion imaging.   ECHOCARDIOGRAM  ECHOCARDIOGRAM COMPLETE 12/11/2017  Narrative *[place]* *[facility]* [address] [place], [place] [address] [phone]  ------------------------------------------------------------------- Transthoracic Echocardiography  Patient:    Melissa Pham, Melissa Pham MR #:       [id number] Study Date: 12/11/2017 Gender:     F Age:        55 Height:     157.5 cm Weight:     89.8 kg BSA:        2.03 m^2 Pt. Status: Room:       6E08C  PERFORMING   W. Viann Fish, MD [facility] ADMITTING    Briscoe Deutscher SONOGRAPHER  Celene Skeen, RDCS ATTENDING    Betsy Pries REFERRING    Andreas Newport J  cc:  ------------------------------------------------------------------- LV EF: 55% -   60%  ------------------------------------------------------------------- History:   PMH:  Chest Pain.  Risk factors:  Hypertension. Diabetes mellitus. Dyslipidemia.  ------------------------------------------------------------------- Study Conclusions  - Left ventricle: The cavity size was normal. There was mild concentric hypertrophy. Systolic function was normal. The estimated ejection fraction was in the range of 55% to 60%. Wall motion was normal; there were no regional wall motion abnormalities. - Mitral valve: Valve area by pressure half-time: 1.85 cm^2.  ------------------------------------------------------------------- Study data:  Comparison was made to the study of 02/21/2015.  Study status:  Routine.  Procedure:  The patient reported no pain pre or post test. Transthoracic echocardiography. Image quality was adequate.  Study completion:  There were no complications. Transthoracic echocardiography.  M-mode, complete 2D,  spectral Doppler, and color Doppler.  Birthdate:  Patient birthdate: March 03, 1966.  Age:  Patient is 57 yr old.  Sex:  Gender: female. BMI: 36.2 kg/m^2.  Blood pressure:     103/67  Patient status: Inpatient.  Study date:  Study date: 12/11/2017. Study time: 11:28 AM.  Location:   Emergency department.  -------------------------------------------------------------------  ------------------------------------------------------------------- Left ventricle:  The cavity size was normal. There was mild concentric hypertrophy. Systolic function was normal. The estimated ejection fraction was in the range of 55% to 60%. Wall motion was normal; there were no regional wall motion abnormalities.  ------------------------------------------------------------------- Aortic valve:   Trileaflet; normal thickness leaflets. Mobility was not restricted.  Doppler:  Transvalvular velocity was within the normal range. There was no stenosis. There was no regurgitation.  ------------------------------------------------------------------- Aorta:  Aortic root: The aortic root was normal in size.  ------------------------------------------------------------------- Mitral valve:   Structurally normal valve.   Mobility was not restricted.  Doppler:  Transvalvular velocity was within the normal range. There was no evidence for stenosis. There was no regurgitation.    Valve area by pressure half-time: 1.85 cm^2. Indexed valve area by pressure half-time: 0.91 cm^2/m^2.    Peak gradient (D): 2 mm Hg.  ------------------------------------------------------------------- Left atrium:  The atrium was normal in size.  ------------------------------------------------------------------- Right ventricle:  The cavity size was normal. Wall thickness was normal. Systolic function was normal.  ------------------------------------------------------------------- Pulmonic valve:   Not well visualized.  Doppler:  Transvalvular velocity was within the normal range. There was no evidence for stenosis.  ------------------------------------------------------------------- Tricuspid valve:   Structurally normal valve.    Doppler: Transvalvular velocity was within the normal range. There was  mild regurgitation.  ------------------------------------------------------------------- Right atrium:  The atrium was normal in size.  ------------------------------------------------------------------- Pericardium:  There was no pericardial effusion.  ------------------------------------------------------------------- Systemic veins: Inferior vena cava: The vessel was normal in size.  ------------------------------------------------------------------- Measurements  Left ventricle                           Value          Reference LV ID, ED, PLAX chordal        (L)       40    mm       43 - 52 LV ID, ES, PLAX chordal                  32    mm       23 - 38 LV fx shortening, PLAX chordal (L)       20    %        >=29 LV PW thickness, ED                      11    mm       --------- IVS/LV PW ratio, ED                      1.09           <=1.3 Stroke volume, 2D                        68    ml       --------- Stroke volume/bsa, 2D                    34    ml/m^2   --------- LV e&',  lateral                           12.9  cm/s     --------- LV E/e&', lateral                         5.71           --------- LV e&', medial                            11.2  cm/s     --------- LV E/e&', medial                          6.58           --------- LV e&', average                           12.05 cm/s     --------- LV E/e&', average                         6.12           ---------  Ventricular septum                       Value          Reference IVS thickness, ED                        12    mm       ---------  LVOT                                     Value          Reference LVOT ID, S                               19    mm       --------- LVOT area                                2.84  cm^2     --------- LVOT peak velocity, S                    107   cm/s     --------- LVOT mean velocity, S                    68.5  cm/s     --------- LVOT VTI, S                              23.8  cm        --------- LVOT peak gradient, S                    5     mm Hg    ---------  Aorta  Value          Reference Aortic root ID, ED                       28    mm       ---------  Left atrium                              Value          Reference LA ID, A-P, ES                           35    mm       --------- LA ID/bsa, A-P                           1.73  cm/m^2   <=2.2 LA volume, S                             43.2  ml       --------- LA volume/bsa, S                         21.3  ml/m^2   --------- LA volume, ES, 1-p A4C                   37.7  ml       --------- LA volume/bsa, ES, 1-p A4C               18.6  ml/m^2   --------- LA volume, ES, 1-p A2C                   40.1  ml       --------- LA volume/bsa, ES, 1-p A2C               19.8  ml/m^2   ---------  Mitral valve                             Value          Reference Mitral E-wave peak velocity              73.7  cm/s     --------- Mitral A-wave peak velocity              40.7  cm/s     --------- Mitral deceleration time       (H)       352   ms       150 - 230 Mitral pressure half-time                103   ms       --------- Mitral peak gradient, D                  2     mm Hg    --------- Mitral E/A ratio, peak                   1.8            --------- Mitral valve area, PHT, DP               1.85  cm^2     ---------  Mitral valve area/bsa, PHT, DP           0.91  cm^2/m^2 ---------  Right atrium                             Value          Reference RA ID, S-I, ES, A4C            (H)       51.4  mm       34 - 49 RA area, ES, A4C                         12.5  cm^2     8.3 - 19.5 RA volume, ES, A/L                       25.4  ml       --------- RA volume/bsa, ES, A/L                   12.5  ml/m^2   ---------  Right ventricle                          Value          Reference RV ID, minor axis, ED, A4C               35    mm       --------- base RV ID, minor axis, ED, A4C mid           29    mm        --------- RV ID, major axis, ED, A4C               55    mm       55 - 91 TAPSE                                    17.1  mm       --------- RV s&', lateral, S                        11.3  cm/s     ---------  Legend: (L)  and  (H)  mark values outside specified reference range.  ------------------------------------------------------------------- Prepared and Electronically Authenticated by  Georga Hacking, MD [facility] 2019-02-07T17:41:29    MONITORS  CARDIAC EVENT MONITOR 12/08/2018  Narrative  Sinus bradycardia, normal sinus rhythm and sinus tachycardia. The average heart rate was 90bpm. The heart rate ranged from 50-127bpm.  Rare PVC   CT SCANS  CT CORONARY MORPH W/CTA COR W/SCORE 12/12/2017  Addendum 12/12/2017  5:23 PM ADDENDUM REPORT: 12/12/2017 17:21  CLINICAL DATA:  Chest pain  EXAM: Cardiac CTA  MEDICATIONS: Sub lingual nitro. 4mg  x 2 and lopressor 5mg  IV  TECHNIQUE: The patient was scanned on a Siemens 192 slice scanner. Gantry rotation speed was 240 msecs. Collimation was 0.6 mm. A 100 kV prospective scan was triggered in the ascending thoracic aorta at 35-75% of the R-R interval. Average HR during the scan was 65 bpm. The 3D data set was interpreted on a dedicated work station using MPR, MIP and VRT modes. A total of 80cc of contrast was used.  FINDINGS: Non-cardiac: See separate  report from [facility].  Calcium Score: 33 Agatston units  Coronary Arteries: Right dominant with no anomalies  LM: No plaque or stenosis.  LAD system: No plaque or stenosis.  Circumflex system: Mixed plaque with mild stenosis in the ostial LCx. No other significant disease in the LCx system.  RCA system: No plaque or stenosis.  IMPRESSION: 1. Coronary artery calcium score 93 Agatston units. This places the patient in the 91st percentile for age and gender, suggesting high risk for future cardiac events.  2. Mild stenosis proximal LCx. No obstructive  coronary disease noted.  Dalton Mclean   Electronically Signed By: Marca Ancona M.D. On: 12/12/2017 17:21  Narrative EXAM: OVER-READ INTERPRETATION CT CHEST  The following report is an over-read performed by radiologist Dr. Hulan Saas of [facility] on 12/12/2017. This over-read does not include interpretation of cardiac or coronary anatomy or pathology. The coronary calcium score/coronary CTA interpretation by the cardiologist is attached.  COMPARISON:  CT chest 07/09/2010.  FINDINGS: Vascular: No visible thoracic or upper abdominal aortic atherosclerosis. No evidence of aortic aneurysm.  Mediastinum/Nodes: No pathologic lymphadenopathy within the visualized mediastinum. Visualized esophagus normal in appearance.  Lungs/Pleura: Emphysematous changes throughout the visualized portions of both lungs. Minimal scarring involving the lingula and left lower lobe. Lung parenchyma otherwise clear. Central airways patent without significant bronchial wall thickening. No pleural effusions.  Upper Abdomen: Extreme upper abdomen unremarkable for the early arterial phase of enhancement.  Musculoskeletal: Spondylosis involving the visualized mid and lower thoracic spine. No acute abnormality.  IMPRESSION: 1.  Emphysema (ICD10-J43.9). 2. No significant extracardiac findings otherwise.  Electronically Signed: By: Hulan Saas M.D. On: 12/12/2017 16:10         Recent Labs: 08/19/2022: ALT 24 09/17/2022: TSH 0.870 05/03/2023: BUN 7; Creatinine, Ser 0.70; Hemoglobin 12.2; Platelets 265; Potassium 3.2; Sodium 141  Recent Lipid Panel    Component Value Date/Time   CHOL 235 (H) 11/22/2022 0953   TRIG 165 (H) 11/22/2022 0953   HDL 44 11/22/2022 0953   CHOLHDL 5.3 (H) 11/22/2022 0953   CHOLHDL 4.6 12/07/2018 0439   VLDL 19 12/07/2018 0439   LDLCALC 161 (H) 11/22/2022 0953   History of Present Illness  57 year old female with past medical history of  ongoing tobacco abuse, hypertension, diabetes, hyperlipidemia, schizophrenia and PTSD.   Previously had coronary CTA in 2019 in setting of chest pain while inpatient.  She had a coronary artery calcium score of 93, this placed her in the 91st percentile for age and gender, noted to have nonobstructive stenosis of proximal LCx.  In 12/2018 she was again seen inpatient following a syncopal event.  She had a Lexiscan MPI that was a low risk study no ischemia noted.  She wore a ZIO monitor for 5 days that showed sinus rhythm, average heart rate 90 bpm, heart rate ranged from 50 to 127 bpm.  Noted to have a rare PVC.   Last seen by Dr. Allyson Sabal on 09/19/2022 for evaluation of atypical chest pain.  She was on metoprolol and lisinopril with good blood pressure control.  She has history of dyslipidemia had previously been on simvastatin but was not taking it at last office visit.  Recommended that she restart this also noted to be ongoing with tobacco abuse for over 40 years.   On 05/03/2023 she was seen in the emergency room for chest pain and numbness.  Reported numbness ongoing for the past 3 weeks in the right leg and right arm has since been  seen by neurology and is started with PT.  She reported chest pain that started the day prior that was not pleuritic or exertional.  Any shortness of breath, syncope or lightheadedness.  Cardiac workup was reassuring troponins were negative x 2, EKG unchanged and chest x-ray was clear.  She had resolution of her chest pain while in the ED without medication use.  Today she reports  Atypical chest pain: Seen in the ED on 05/03/23 with atypical chest pain that was not associated with exertion, she had no associated symptoms. Cardiac workup unremarkable including 2 negative troponin's.  Coronary CTA in 2019 indicated coronary artery calcium score of 93, this placed her in the 91st percentile for age and gender, noted to have nonobstructive stenosis of proximal Lcx.  In 12/2018  she had a Lexiscan MPI that was a low risk study no ischemia noted. Continue aspirin, lisinopril, Toprol-XL, simvastatin HTN: Blood pressure today: Blood pressure at home:  Continue lisinopril and Toprol-XL HLD: Last lipid panel on 11/22/2022 indicated a total cholesterol of 235 and LDL of 161. Continue simvastatin 40mg  daily  Syncope:  Further episodes?   Home Medications    Current Outpatient Medications  Medication Sig Dispense Refill   albuterol (PROVENTIL) (2.5 MG/3ML) 0.083% nebulizer solution Take 3 mLs (2.5 mg total) by nebulization every 6 (six) hours as needed for wheezing or shortness of breath. 75 mL 12   Albuterol Sulfate (PROAIR RESPICLICK) 108 (90 Base) MCG/ACT AEPB Inhale 1 puff into the lungs every 6 (six) hours. 1 each 3   aspirin EC 81 MG tablet Take 81 mg by mouth daily.     busPIRone (BUSPAR) 7.5 MG tablet Take 1 tablet (7.5 mg total) by mouth 2 (two) times daily. For anxiety 60 tablet 0   dicyclomine (BENTYL) 20 MG tablet Take 1 tablet (20 mg total) by mouth 2 (two) times daily. 20 tablet 0   fluticasone-salmeterol (ADVAIR HFA) 230-21 MCG/ACT inhaler Inhale 2 puffs into the lungs 2 (two) times daily. 12 g 12   gabapentin (NEURONTIN) 300 MG capsule Take 1 capsule (300 mg total) by mouth at bedtime. 90 capsule 1   glimepiride (AMARYL) 2 MG tablet Take 1 tablet by mouth once daily with breakfast 90 tablet 0   glucose blood (ACCU-CHEK GUIDE) test strip Use as instructed. Check blood glucose by fingerstick twice per day. 100 each 12   hydrocortisone (ANUSOL-HC) 2.5 % rectal cream Place 1 Application rectally 2 (two) times daily. 30 g 0   ibuprofen (ADVIL) 400 MG tablet Take 1 tablet (400 mg total) by mouth every 6 (six) hours as needed for moderate pain. 30 tablet 0   Lidocaine-Glycerin (PREPARATION H RE) Place 1 application  rectally as needed (hemmroids).     lisinopril (ZESTRIL) 5 MG tablet Take 1 tablet (5 mg total) by mouth daily. 90 tablet 1   meloxicam (MOBIC) 15 MG  tablet Take 1 tablet (15 mg total) by mouth daily. 30 tablet 3   MELOXICAM PO Take by mouth.     methocarbamol (ROBAXIN) 500 MG tablet Take 1 tablet (500 mg total) by mouth 2 (two) times daily. 25 tablet 0   metoprolol succinate (TOPROL-XL) 50 MG 24 hr tablet Take 1 tablet (50 mg total) by mouth daily. Take with or immediately following a meal. 90 tablet 1   pantoprazole (PROTONIX) 40 MG tablet Take 1 tablet (40 mg total) by mouth daily. 90 tablet 1   PARoxetine (PAXIL) 20 MG tablet Take 1 tablet by mouth once daily  90 tablet 0   simvastatin (ZOCOR) 40 MG tablet Take 1 tablet (40 mg total) by mouth daily. 90 tablet 1   sucralfate (CARAFATE) 1 g tablet Take 1 tablet (1 g total) by mouth 2 (two) times daily. 30 tablet 0   valACYclovir (VALTREX) 1000 MG tablet TAKE 1 TABLET BY MOUTH THREE TIMES DAILY 270 tablet 0   No current facility-administered medications for this visit.     Review of Systems    ***.  All other systems reviewed and are otherwise negative except as noted above.    Physical Exam    VS:  LMP 07/21/2013  , BMI There is no height or weight on file to calculate BMI.     GEN: Well nourished, well developed, in no acute distress. HEENT: normal. Neck: Supple, no JVD, carotid bruits, or masses. Cardiac: RRR, no murmurs, rubs, or gallops. No clubbing, cyanosis, edema.  Radials/DP/PT 2+ and equal bilaterally.  Respiratory:  Respirations regular and unlabored, clear to auscultation bilaterally. GI: Soft, nontender, nondistended, BS + x 4. MS: no deformity or atrophy. Skin: warm and dry, no rash. Neuro:  Strength and sensation are intact. Psych: Normal affect.  Accessory Clinical Findings    ECG personally reviewed by me today -    - no acute changes.   Lab Results  Component Value Date   WBC 5.1 05/03/2023   HGB 12.2 05/03/2023   HCT 37.3 05/03/2023   MCV 85.2 05/03/2023   PLT 265 05/03/2023   Lab Results  Component Value Date   CREATININE 0.70 05/03/2023   BUN 7  05/03/2023   NA 141 05/03/2023   K 3.2 (L) 05/03/2023   CL 106 05/03/2023   CO2 25 05/03/2023   Lab Results  Component Value Date   ALT 24 08/19/2022   AST 37 08/19/2022   ALKPHOS 59 08/19/2022   BILITOT 0.9 08/19/2022   Lab Results  Component Value Date   CHOL 235 (H) 11/22/2022   HDL 44 11/22/2022   LDLCALC 161 (H) 11/22/2022   TRIG 165 (H) 11/22/2022   CHOLHDL 5.3 (H) 11/22/2022    Lab Results  Component Value Date   HGBA1C 6.1 (H) 11/22/2022    Assessment & Plan    1.  ***  No BP recorded.  {Refresh Note OR Click here to enter BP  :1}***   Rip Harbour, NP 05/15/2023, 10:28 AM

## 2023-05-16 ENCOUNTER — Ambulatory Visit: Payer: 59 | Admitting: General Practice

## 2023-05-21 ENCOUNTER — Other Ambulatory Visit: Payer: 59

## 2023-05-22 ENCOUNTER — Encounter: Payer: Self-pay | Admitting: Pharmacist

## 2023-05-22 DIAGNOSIS — Z9229 Personal history of other drug therapy: Secondary | ICD-10-CM

## 2023-05-22 NOTE — Progress Notes (Signed)
[facility] Quality Pharmacy Team Statin Quality Measure Assessment  05/22/2023  Melissa Pham [birth year] [id number]  Per review of chart and payor information, patient has a diagnosis of diabetes but is not currently filling a statin prescription.  This places patient into the Statin Use In Patients with Diabetes (SUPD) measure for CMS.    Simvastatin is on the patient's medication list but has not been filled this year.  No documentation of statin intolerance. Patient has an upcoming appointment on 05/23/23.  If deemed therapeutically appropriate, statin therapy could be assessed during the upcoming appointment.  If patient has experienced statin intolerance an alternative statin could be trialed with increased dosing interval or a statin exclusion code could be associated with the visit. Associating an exclusion code will remove the patient from the measure.  The 10-year ASCVD risk score (Arnett DK, et al., 2019) is: 27.6%   Values used to calculate the score:     Age: 57 years     Sex: Female     Is Non-Hispanic African American: Yes     Diabetic: Yes     Tobacco smoker: Yes     Systolic Blood Pressure: 119 mmHg     Is BP treated: Yes     HDL Cholesterol: 44 mg/dL     Total Cholesterol: 235 mg/dL 5/63/8756     Component Value Date/Time   CHOL 235 (H) 11/22/2022 0953   TRIG 165 (H) 11/22/2022 0953   HDL 44 11/22/2022 0953   CHOLHDL 5.3 (H) 11/22/2022 0953   CHOLHDL 4.6 12/07/2018 0439   VLDL 19 12/07/2018 0439   LDLCALC 161 (H) 11/22/2022 0953    Please consider ONE of the following recommendations:  Initiate high intensity statin Atorvastatin 40 mg once daily, #90, 3 refills   Rosuvastatin 20 mg once daily, #90, 3 refills    Initiate moderate intensity          statin with reduced frequency if prior          statin intolerance 1x weekly, #13, 3 refills   2x weekly, #26, 3 refills   3x weekly, #39, 3 refills    Code for past statin intolerance  or  other exclusions (required annually)  Provider Requirements: Associate code during an office visit or telehealth encounter  Drug Induced Myopathy G72.0   Myopathy, unspecified G72.9   Myositis, unspecified M60.9   Rhabdomyolysis M62.82   Cirrhosis of liver K74.69   Prediabetes R73.03   PCOS E28.2   Plan: Route note to Patient's Provider prior to the upcoming appointment.  Beecher Mcardle, PharmD, BCACP [facility] Clinical Pharmacist [phone]

## 2023-05-23 ENCOUNTER — Ambulatory Visit: Payer: 59 | Admitting: Nurse Practitioner

## 2023-05-28 ENCOUNTER — Inpatient Hospital Stay: Admission: RE | Admit: 2023-05-28 | Payer: 59 | Source: Ambulatory Visit

## 2023-05-31 NOTE — Progress Notes (Deleted)
Cardiology Office Note:    Date:  05/31/2023   ID:  Melissa Pham, DOB Aug 15, 1966, MRN [id number]  PCP:  Claiborne Rigg, NP   [facility] Providers Cardiologist:  None { Click to update primary MD,subspecialty MD or APP then REFRESH:1}    Referring MD: Claiborne Rigg, NP   No chief complaint on file. ***  History of Present Illness:    Melissa Pham is a 57 y.o. female with a hx of bipolar disorder, schizophrenia, ADHD, GERD, DM 2, mild nonobstructive CAD, hypertension, and hyperlipidemia.  She had a CT coronary 12/12/2017 that showed an elevated coronary calcium score of 93 placing her at the 91st percentile and mild stenosis of the proximal LCX but no obstructive CAD.  Risk factor modification was recommended.  Echocardiogram at that time revealed an LVEF 55-60%, mild LVH, no RWMA, and no significant valvular disease.  She was hospitalized 12/06/2018 after syncopal episode while driving.  Cardiac enzymes were negative and EKG and telemetry were unremarkable.  Nuclear stress test was obtained and was nonischemic.  Heart monitor 12/2018 with a rare PVC, no evidence of significant bradycardia, high-grade heart block, or pauses to explain syncope.  She abided by 63-month driving restrictions.  She reestablish care with Dr. Gery Pray 09/19/2022 for evaluation of risk factor modification.  She is a current smoker with no history of MI or stroke.  She presented to [facility] ED 03/14/2023 for chest pain.  She ruled out with negative enzymes and nonischemic EKG.  She was discharged without admission. She was seen at [facility] ED 04/24/2023 for chest pain and numbness.  She ruled out with negative enzymes and nonischemic EKG.  She was discharged without admission.  She presents today for routine follow-up.       Nonobstructive CAD Reassuring CT coronary in 2019 Nonischemic nuclear stress test in 2020   Hypertension Maintained on metoprolol and lisinopril   Hyperlipidemia with LDL goal  less than 70 11/22/2022: Cholesterol, Total 235; HDL 44; LDL Chol Calc (NIH) 161; Triglycerides 165 Listed as taking 40 mg simvastatin but at last visit patient states she was not taking   Tobacco abuse Counseled on smoking cessation       Past Medical History:  Diagnosis Date   ADHD (attention deficit hyperactivity disorder)    Anxiety    Asthma    Bipolar disorder (HCC)    Blood transfusion without reported diagnosis 11/05/1983   after childbirth   Coronary artery disease    Depression    Diabetes mellitus without complication (HCC)    GERD (gastroesophageal reflux disease)    History of illicit drug use    Hyperlipidemia    Hypertension    Schizophrenia (HCC)    Sickle cell trait (HCC)    Tobacco use     Past Surgical History:  Procedure Laterality Date   CERVICAL BIOPSY  W/ LOOP ELECTRODE EXCISION     left eye surgery  1970   TUBAL LIGATION  1989    Current Medications: No outpatient medications have been marked as taking for the 06/11/23 encounter (Appointment) with Marcelino Duster, PA.     Allergies:   Sulfa antibiotics   Social History   Socioeconomic History   Marital status: Single    Spouse name: Not on file   Number of children: Not on file   Years of education: Not on file   Highest education level: Not on file  Occupational History   Not on file  Tobacco Use  Smoking status: Every Day    Current packs/day: 0.25    Average packs/day: 0.3 packs/day for 4.0 years (1.0 ttl pk-yrs)    Types: Cigarettes   Smokeless tobacco: Never  Vaping Use   Vaping status: Never Used  Substance and Sexual Activity   Alcohol use: No    Alcohol/week: 0.0 standard drinks of alcohol   Drug use: No    Comment: no drug use for 7 years   Sexual activity: Not Currently    Birth control/protection: Surgical  Other Topics Concern   Not on file  Social History Narrative   Are you right handed or left handed? Right Handed    Are you currently employed ? Yes    What is your current occupation?   Do you live at home alone? No   Who lives with you? Just her dog    What type of home do you live in: 1 story or 2 story? Two story home           Daughter got killed. Hit by a bus.   Social Determinants of Health   Financial Resource Strain: Low Risk  (01/20/2023)   Overall Financial Resource Strain (CARDIA)    Difficulty of Paying Living Expenses: Not hard at all  Food Insecurity: Food Insecurity Present (01/20/2023)   Hunger Vital Sign    Worried About Running Out of Food in the Last Year: Often true    Ran Out of Food in the Last Year: Often true  Transportation Needs: No Transportation Needs (01/20/2023)   PRAPARE - Administrator, Civil Service (Medical): No    Lack of Transportation (Non-Medical): No  Physical Activity: Insufficiently Active (01/20/2023)   Exercise Vital Sign    Days of Exercise per Week: 1 day    Minutes of Exercise per Session: 20 min  Stress: Stress Concern Present (01/20/2023)   [employer] of Occupational Health - Occupational Stress Questionnaire    Feeling of Stress : To some extent  Social Connections: Moderately Integrated (01/20/2023)   Social Connection and Isolation Panel [NHANES]    Frequency of Communication with Friends and Family: More than three times a week    Frequency of Social Gatherings with Friends and Family: More than three times a week    Attends Religious Services: 1 to 4 times per year    Active Member of [employer] or Organizations: Yes    Attends Banker Meetings: 1 to 4 times per year    Marital Status: Never married     Family History: The patient's ***family history includes Arthritis in her mother; Breast cancer in her paternal aunt and sister; Breast cancer (age of onset: 106) in her sister; Cancer in her sister; Colon cancer (age of onset: 76) in her maternal grandmother; Diabetes in her father; Heart attack in her father; Hyperlipidemia in her father and mother;  Hypertension in her father; Kidney disease in an other family member; Pancreatic disease in an other family member; Parkinson's disease in her mother; Sickle cell anemia in an other family member; Stroke in an other family member. There is no history of Esophageal cancer or Rectal cancer.  ROS:   Please see the history of present illness.    *** All other systems reviewed and are negative.  EKGs/Labs/Other Studies Reviewed:    The following studies were reviewed today: ***      Recent Labs: 08/19/2022: ALT 24 09/17/2022: TSH 0.870 05/03/2023: BUN 7; Creatinine, Ser 0.70; Hemoglobin 12.2; Platelets  265; Potassium 3.2; Sodium 141  Recent Lipid Panel    Component Value Date/Time   CHOL 235 (H) 11/22/2022 0953   TRIG 165 (H) 11/22/2022 0953   HDL 44 11/22/2022 0953   CHOLHDL 5.3 (H) 11/22/2022 0953   CHOLHDL 4.6 12/07/2018 0439   VLDL 19 12/07/2018 0439   LDLCALC 161 (H) 11/22/2022 0953     Risk Assessment/Calculations:   {Does this patient have ATRIAL FIBRILLATION?:[phone]}  No BP recorded.  {Refresh Note OR Click here to enter BP  :1}***         Physical Exam:    VS:  LMP 07/21/2013     Wt Readings from Last 3 Encounters:  05/12/23 171 lb (77.6 kg)  05/03/23 177 lb (80.3 kg)  03/14/23 177 lb (80.3 kg)     GEN: *** Well nourished, well developed in no acute distress HEENT: Normal NECK: No JVD; No carotid bruits LYMPHATICS: No lymphadenopathy CARDIAC: ***RRR, no murmurs, rubs, gallops RESPIRATORY:  Clear to auscultation without rales, wheezing or rhonchi  ABDOMEN: Soft, non-tender, non-distended MUSCULOSKELETAL:  No edema; No deformity  SKIN: Warm and dry NEUROLOGIC:  Alert and oriented x 3 PSYCHIATRIC:  Normal affect   ASSESSMENT:    No diagnosis found. PLAN:    In order of problems listed above:  ***      {Are you ordering a CV Procedure (e.g. stress test, cath, DCCV, TEE, etc)?   Press F2        :[id number]}    Medication Adjustments/Labs and  Tests Ordered: Current medicines are reviewed at length with the patient today.  Concerns regarding medicines are outlined above.  No orders of the defined types were placed in this encounter.  No orders of the defined types were placed in this encounter.   There are no Patient Instructions on file for this visit.   Signed, Marcelino Duster, PA  05/31/2023 1:55 PM    [facility]

## 2023-06-03 DIAGNOSIS — J45998 Other asthma: Secondary | ICD-10-CM | POA: Diagnosis not present

## 2023-06-09 ENCOUNTER — Ambulatory Visit: Payer: 59 | Admitting: Neurology

## 2023-06-11 ENCOUNTER — Ambulatory Visit: Payer: 59 | Attending: General Practice | Admitting: Physician Assistant

## 2023-07-04 DIAGNOSIS — J45998 Other asthma: Secondary | ICD-10-CM | POA: Diagnosis not present

## 2023-07-06 ENCOUNTER — Other Ambulatory Visit: Payer: Self-pay | Admitting: Family Medicine

## 2023-07-08 ENCOUNTER — Ambulatory Visit: Payer: Self-pay

## 2023-07-08 NOTE — Telephone Encounter (Signed)
Requested Prescriptions  Pending Prescriptions Disp Refills   ibuprofen (ADVIL) 400 MG tablet [Pharmacy Med Name: Ibuprofen 400 MG Oral Tablet] 30 tablet 0    Sig: TAKE 1 TABLET BY MOUTH EVERY 6 HOURS AS NEEDED FOR MODERATE PAIN     Analgesics:  NSAIDS Failed - 07/06/2023  1:40 AM      Failed - Manual Review: Labs are only required if the patient has taken medication for more than 8 weeks.      Passed - Cr in normal range and within 360 days    Creatinine, Ser  Date Value Ref Range Status  05/03/2023 0.70 0.44 - 1.00 mg/dL Final         Passed - HGB in normal range and within 360 days    Hemoglobin  Date Value Ref Range Status  05/03/2023 12.2 12.0 - 15.0 g/dL Final  47/42/5956 38.7 11.1 - 15.9 g/dL Final         Passed - PLT in normal range and within 360 days    Platelets  Date Value Ref Range Status  05/03/2023 265 150 - 400 K/uL Final  11/22/2022 346 150 - 450 x10E3/uL Final         Passed - HCT in normal range and within 360 days    HCT  Date Value Ref Range Status  05/03/2023 37.3 36.0 - 46.0 % Final   Hematocrit  Date Value Ref Range Status  11/22/2022 37.7 34.0 - 46.6 % Final         Passed - eGFR is 30 or above and within 360 days    GFR calc Af Amer  Date Value Ref Range Status  02/23/2020 >60 >60 mL/min Final   GFR, Estimated  Date Value Ref Range Status  05/03/2023 >60 >60 mL/min Final    Comment:    (NOTE) Calculated using the CKD-EPI Creatinine Equation (2021)    eGFR  Date Value Ref Range Status  04/09/2022 102 >59 mL/min/1.73 Final         Passed - Patient is not pregnant      Passed - Valid encounter within last 12 months    Recent Outpatient Visits           4 months ago Mild intermittent asthma without complication   [facility] [place], Shea Stakes, NP   5 months ago Encounter for [employer] annual wellness exam   [facility] [place], [place], NP   7 months ago  Encounter for Papanicolaou smear for cervical cancer screening   [facility] [place], Shea Stakes, NP   8 months ago Encounter for medication review and counseling   [facility] [place], Cornelius Moras, RPH-CPP   9 months ago Encounter to establish care   [facility] [place], Shea Stakes, NP       Future Appointments             In 1 month [facility], [place], [place] [facility]

## 2023-07-08 NOTE — Telephone Encounter (Signed)
I need some motion sickness medicine for my cruise coming up on Friday.    I turned in a form for my job that needed to be filled out.     I was checking to see if it was ready.     They were hoping they could just make a copy of the previous form they had to fill out for my job.  I let her know about Dramamine OTC, also ginger tablets she can purchase made my Dramamine OTC and the wrist bands to use for pressure points on the inside of her wrists for motion sickness.   She thanked me very much for my help.

## 2023-07-08 NOTE — Telephone Encounter (Signed)
Summary: travel concern / rx req   The patient has called to share that they will be leaving Friday for a cruise to the [place] 07/11/23  The patient would like to be prescribed something for their motion sickness  Please contact the patient further when possible      Left message to call back.

## 2023-07-08 NOTE — Telephone Encounter (Signed)
Patient aware of forms waiting at the front desk for pickup.

## 2023-07-08 NOTE — Telephone Encounter (Signed)
  Chief Complaint: Requesting medication for motion sickness for up coming cruise to [place] on Friday.   Symptoms: N/A Frequency: N/A Pertinent Negatives: Patient denies N/A Disposition: [] ED /[] Urgent Care (no appt availability in office) / [] Appointment(In office/virtual)/ []  [facility]/ [x] Home Care/ [] Refused Recommended Disposition /[] [place] [facility]/ []  Follow-up with PCP Additional Notes: I let her know about the wrist bands which apply pressure points in the insides of her wrists, Dramamine with Meclizine in it and also Dramamine ginger tablets that would not make her sleepy.   All of these were able to be purchased OTC.    She thanked me very much for my help.  She brought in a form to be filled out for her job.   She was checking to see if it was ready.   She will come by and pick it up on Thursday if it's ready.

## 2023-07-10 ENCOUNTER — Encounter: Payer: Self-pay | Admitting: Neurology

## 2023-07-10 ENCOUNTER — Ambulatory Visit: Payer: 59 | Admitting: Neurology

## 2023-07-15 ENCOUNTER — Other Ambulatory Visit: Payer: Self-pay

## 2023-07-15 MED ORDER — ALBUTEROL SULFATE HFA 108 (90 BASE) MCG/ACT IN AERS: 2.0000 | INHALATION_SPRAY | Freq: Four times a day (QID) | RESPIRATORY_TRACT | 0 refills | Status: DC | PRN

## 2023-07-22 ENCOUNTER — Encounter (HOSPITAL_COMMUNITY): Payer: Self-pay

## 2023-07-22 ENCOUNTER — Ambulatory Visit (HOSPITAL_COMMUNITY)
Admission: EM | Admit: 2023-07-22 | Discharge: 2023-07-22 | Disposition: A | Discharge status: Home / Self Care | Payer: 59 | Attending: Emergency Medicine | Admitting: Emergency Medicine

## 2023-07-22 ENCOUNTER — Telehealth: Payer: Self-pay | Admitting: Neurology

## 2023-07-22 DIAGNOSIS — H66001 Acute suppurative otitis media without spontaneous rupture of ear drum, right ear: Secondary | ICD-10-CM

## 2023-07-22 DIAGNOSIS — R051 Acute cough: Secondary | ICD-10-CM | POA: Diagnosis not present

## 2023-07-22 DIAGNOSIS — R197 Diarrhea, unspecified: Secondary | ICD-10-CM | POA: Diagnosis not present

## 2023-07-22 MED ORDER — LOPERAMIDE HCL 2 MG PO CAPS: 2.0000 mg | ORAL_CAPSULE | Freq: Four times a day (QID) | ORAL | 0 refills | Status: DC | PRN

## 2023-07-22 MED ORDER — AMOXICILLIN-POT CLAVULANATE 875-125 MG PO TABS: 1.0000 | ORAL_TABLET | Freq: Two times a day (BID) | ORAL | 0 refills | Status: DC

## 2023-07-22 MED ORDER — BENZONATATE 100 MG PO CAPS: 100.0000 mg | ORAL_CAPSULE | Freq: Three times a day (TID) | ORAL | 0 refills | Status: DC | PRN

## 2023-07-22 NOTE — Discharge Instructions (Signed)
Start taking Augmentin twice daily for 7 days for ear infection. You can take Tessalon every 8 hours as needed for cough. You can take Imodium as needed for diarrhea. Otherwise you can continue over-the counter medication you have been taking for symptoms. You can also continue using albuterol inhaler as needed. Return here as needed.

## 2023-07-22 NOTE — Telephone Encounter (Signed)
She had seen Dr. Allena Katz for her right leg and it showed a pinched nerve in her back, has she done the PT? Nerve issues would not cause swelling though, she needs to discuss swelling with her PCP, thanks

## 2023-07-22 NOTE — Telephone Encounter (Signed)
Patient stopped by the office to let us know that her rt leg is swelling and it is numb and she needs to know what she needs to do.   She states that her leg is numb from 24 hour

## 2023-07-22 NOTE — ED Triage Notes (Signed)
Patient here today with c/o ST, diarrhea, body aches, chills, runny nose, and right ear pain since last Wednesday. She has been taking Tylenol, Dayquil, Nyquil, Alka Seltzer cold and flu, and drinking hot teat with no relief. She developed the symptoms while on a cruise to the [place]. Other group members had Covid but she doesn't think she has it. She believes it is the Flu.

## 2023-07-22 NOTE — ED Provider Notes (Signed)
[facility]    CSN: [id number] Arrival date & time: 07/22/23  1423      History   Chief Complaint Chief Complaint  Patient presents with   Sore Throat    HPI Melissa Pham is a 57 y.o. female.   Patient presents with runny nose, body aches, chills, sore throat, nausea, diarrhea, and right ear pain since last Wednesday.  Reports taking Tylenol, DayQuil, NyQuil, Alka-Seltzer cold and flu, and drinking hot tea with minimal relief.  Patient also reports using albuterol inhaler for cough with some relief.. Denies abdominal pain, shortness of breath, chest pain, and fever.  History of asthma.   Sore Throat Pertinent negatives include no chest pain, no abdominal pain, no headaches and no shortness of breath.    Past Medical History:  Diagnosis Date   ADHD (attention deficit hyperactivity disorder)    Anxiety    Asthma    Bipolar disorder (HCC)    Blood transfusion without reported diagnosis 11/05/1983   after childbirth   Coronary artery disease    Depression    Diabetes mellitus without complication (HCC)    GERD (gastroesophageal reflux disease)    History of illicit drug use    Hyperlipidemia    Hypertension    Schizophrenia (HCC)    Sickle cell trait (HCC)    Tobacco use     Patient Active Problem List   Diagnosis Date Noted   Syncope 12/06/2018   CAD in native artery    Tobacco abuse 07/27/2018   Pure hypercholesterolemia    Generalized abdominal discomfort 12/10/2017   Effusion, right knee 03/13/2017   Sprain of right knee 03/13/2017   Chest pain 02/20/2015   Suprapubic pain 02/20/2015   Diarrhea 02/20/2015   Diabetes mellitus, type 2 (HCC) 02/20/2015   HTN (hypertension) 02/20/2015   Dyslipidemia 02/20/2015   Bipolar affective disorder (HCC) 02/20/2015   Schizophrenia (HCC) 02/20/2015   Hypokalemia 02/20/2015    Past Surgical History:  Procedure Laterality Date   CERVICAL BIOPSY  W/ LOOP ELECTRODE EXCISION     left eye surgery  1970    TUBAL LIGATION  1989    OB History     Gravida  7   Para      Term      Preterm      AB  3   Living  4      SAB  1   IAB  2   Ectopic      Multiple      Live Births               Home Medications    Prior to Admission medications   Medication Sig Start Date End Date Taking? Authorizing Provider  amoxicillin-clavulanate (AUGMENTIN) 875-125 MG tablet Take 1 tablet by mouth every 12 (twelve) hours. 07/22/23  Yes Susann Givens, Rebel Willcutt A, NP  benzonatate (TESSALON) 100 MG capsule Take 1 capsule (100 mg total) by mouth every 8 (eight) hours as needed for cough. 07/22/23  Yes Wynonia Lawman A, NP  loperamide (IMODIUM) 2 MG capsule Take 1 capsule (2 mg total) by mouth 4 (four) times daily as needed for diarrhea or loose stools. 07/22/23  Yes Susann Givens, Jerrod Damiano A, NP  albuterol (PROVENTIL) (2.5 MG/3ML) 0.083% nebulizer solution Take 3 mLs (2.5 mg total) by nebulization every 6 (six) hours as needed for wheezing or shortness of breath. 02/19/23   Claiborne Rigg, NP  albuterol (VENTOLIN HFA) 108 (90 Base) MCG/ACT inhaler Inhale 2 puffs into the  lungs every 6 (six) hours as needed for wheezing or shortness of breath. 07/15/23   Claiborne Rigg, NP  aspirin EC 81 MG tablet Take 81 mg by mouth daily.    [provider]  busPIRone (BUSPAR) 7.5 MG tablet Take 1 tablet (7.5 mg total) by mouth 2 (two) times daily. For anxiety 09/17/22   Claiborne Rigg, NP  cyclobenzaprine (FLEXERIL) 5 MG tablet Take 1 tablet (5 mg total) by mouth at bedtime. 05/15/23   Patel, Roxana Hires K, DO  dicyclomine (BENTYL) 20 MG tablet Take 1 tablet (20 mg total) by mouth 2 (two) times daily. 07/30/22   Smoot, Shawn Route, PA-C  fluticasone-salmeterol (ADVAIR HFA) 230-21 MCG/ACT inhaler Inhale 2 puffs into the lungs 2 (two) times daily. 03/14/23   Claiborne Rigg, NP  gabapentin (NEURONTIN) 300 MG capsule Take 1 capsule (300 mg total) by mouth at bedtime. 09/17/22   Claiborne Rigg, NP  glimepiride (AMARYL) 2 MG  tablet Take 1 tablet by mouth once daily with breakfast 04/07/23   Claiborne Rigg, NP  glucose blood (ACCU-CHEK GUIDE) test strip Use as instructed. Check blood glucose by fingerstick twice per day. 09/17/22   Claiborne Rigg, NP  hydrocortisone (ANUSOL-HC) 2.5 % rectal cream Place 1 Application rectally 2 (two) times daily. 07/29/22   White, Elita Boone, NP  ibuprofen (ADVIL) 400 MG tablet TAKE 1 TABLET BY MOUTH EVERY 6 HOURS AS NEEDED FOR MODERATE PAIN 07/08/23   Claiborne Rigg, NP  Lidocaine-Glycerin (PREPARATION H RE) Place 1 application  rectally as needed (hemmroids).    [provider]  lisinopril (ZESTRIL) 5 MG tablet Take 1 tablet (5 mg total) by mouth daily. 09/17/22   Claiborne Rigg, NP  meloxicam (MOBIC) 15 MG tablet Take 1 tablet (15 mg total) by mouth daily. 02/19/23   Claiborne Rigg, NP  MELOXICAM PO Take by mouth.    [provider]  metoprolol succinate (TOPROL-XL) 50 MG 24 hr tablet Take 1 tablet (50 mg total) by mouth daily. Take with or immediately following a meal. 09/17/22   Claiborne Rigg, NP  pantoprazole (PROTONIX) 40 MG tablet Take 1 tablet (40 mg total) by mouth daily. 09/17/22   Claiborne Rigg, NP  PARoxetine (PAXIL) 20 MG tablet Take 1 tablet by mouth once daily 04/07/23   Claiborne Rigg, NP  simvastatin (ZOCOR) 40 MG tablet Take 1 tablet (40 mg total) by mouth daily. 09/17/22   Claiborne Rigg, NP  sucralfate (CARAFATE) 1 g tablet Take 1 tablet (1 g total) by mouth 2 (two) times daily. 03/27/22   Meccariello, Solmon Ice, MD  valACYclovir (VALTREX) 1000 MG tablet TAKE 1 TABLET BY MOUTH THREE TIMES DAILY 04/07/23   Claiborne Rigg, NP    Family History Family History  Problem Relation Age of Onset   Hyperlipidemia Mother    Arthritis Mother    Parkinson's disease Mother    Heart attack Father    Diabetes Father    Hypertension Father    Hyperlipidemia Father    Cancer Sister        cervical   Breast cancer Sister 26   Breast cancer Sister     Colon cancer Maternal Grandmother 32   Breast cancer Paternal Aunt    Kidney disease Other    Stroke Other    Pancreatic disease Other    Sickle cell anemia Other    Esophageal cancer Neg Hx    Rectal cancer Neg Hx  Social History Social History   Tobacco Use   Smoking status: Every Day    Current packs/day: 0.25    Average packs/day: 0.3 packs/day for 4.0 years (1.0 ttl pk-yrs)    Types: Cigarettes   Smokeless tobacco: Never  Vaping Use   Vaping status: Never Used  Substance Use Topics   Alcohol use: No    Alcohol/week: 0.0 standard drinks of alcohol   Drug use: No    Comment: no drug use for 7 years     Allergies   Sulfa antibiotics   Review of Systems Review of Systems  Constitutional:  Positive for chills and fatigue. Negative for fever.  HENT:  Positive for congestion, ear pain, rhinorrhea and sore throat. Negative for sinus pressure, sinus pain and trouble swallowing.   Respiratory:  Positive for cough. Negative for choking, chest tightness, shortness of breath and wheezing.   Cardiovascular:  Negative for chest pain.  Gastrointestinal:  Positive for diarrhea and nausea. Negative for abdominal pain and vomiting.  Neurological:  Negative for dizziness, weakness, light-headedness and headaches.     Physical Exam Triage Vital Signs ED Triage Vitals  Encounter Vitals Group     BP 07/22/23 1538 120/74     Systolic BP Percentile --      Diastolic BP Percentile --      Pulse Rate 07/22/23 1538 86     Resp 07/22/23 1538 16     Temp 07/22/23 1538 98 F (36.7 C)     Temp Source 07/22/23 1538 Oral     SpO2 07/22/23 1538 94 %     Weight --      Height 07/22/23 1544 5\' 1"  (1.549 m)     Head Circumference --      Peak Flow --      Pain Score 07/22/23 1541 9     Pain Loc --      Pain Education --      Exclude from Growth Chart --    No data found.  Updated Vital Signs BP 120/74 (BP Location: Left Arm)   Pulse 86   Temp 98 F (36.7 C) (Oral)   Resp  16   Ht 5\' 1"  (1.549 m)   LMP 07/21/2013   SpO2 94%   BMI 32.31 kg/m   Visual Acuity Right Eye Distance:   Left Eye Distance:   Bilateral Distance:    Right Eye Near:   Left Eye Near:    Bilateral Near:     Physical Exam Vitals and nursing note reviewed.  Constitutional:      General: She is awake. She is not in acute distress.    Appearance: Normal appearance. She is well-developed and well-groomed. She is not ill-appearing, toxic-appearing or diaphoretic.  HENT:     Right Ear: Ear canal and external ear normal. Tympanic membrane is erythematous and bulging.     Left Ear: Tympanic membrane, ear canal and external ear normal.     Nose: Congestion and rhinorrhea present.     Right Sinus: No maxillary sinus tenderness or frontal sinus tenderness.     Left Sinus: No maxillary sinus tenderness or frontal sinus tenderness.     Mouth/Throat:     Mouth: Mucous membranes are moist.     Pharynx: Posterior oropharyngeal erythema and postnasal drip present. No pharyngeal swelling or oropharyngeal exudate.     Tonsils: No tonsillar exudate.  Cardiovascular:     Rate and Rhythm: Normal rate.     Heart sounds: Normal heart  sounds.  Pulmonary:     Effort: Pulmonary effort is normal. No respiratory distress.     Breath sounds: Normal breath sounds. No decreased breath sounds, wheezing, rhonchi or rales.  Abdominal:     General: Bowel sounds are normal. There is no distension.     Palpations: Abdomen is soft.     Tenderness: There is no abdominal tenderness. There is no guarding or rebound.  Musculoskeletal:     Cervical back: Normal range of motion.  Skin:    General: Skin is warm and dry.  Neurological:     Mental Status: She is alert.  Psychiatric:        Behavior: Behavior is cooperative.      UC Treatments / Results  Labs (all labs ordered are listed, but only abnormal results are displayed) Labs Reviewed - No data to display  EKG   Radiology No results  found.  Procedures Procedures (including critical care time)  Medications Ordered in UC Medications - No data to display  Initial Impression / Assessment and Plan / UC Course  I have reviewed the triage vital signs and the nursing notes.  Pertinent labs & imaging results that were available during my care of the patient were reviewed by me and considered in my medical decision making (see chart for details).     Patient presented with bodyaches, rhinorrhea, chills, sore throat, nausea, diarrhea, and right ear pain since last Wednesday. Reports taking Tylenol, DayQuil, NyQuil, Alka-Seltzer, drinking hot tea with minimal relief.  Patient reports using albuterol inhaler for cough with some relief.  Denies abdominal pain, shortness of breath, chest pain, and fever. History of asthma. Upon assessment patient has erythematous and bulging right TM. Lungs clear bilaterally on auscultation. Prescribed Augmentin for otitis media. Prescribed Tessalon for cough and Imodium for diarrhea as needed.  Discussed return precautions. Final Clinical Impressions(s) / UC Diagnoses   Final diagnoses:  Non-recurrent acute suppurative otitis media of right ear without spontaneous rupture of tympanic membrane  Acute cough  Diarrhea, unspecified type     Discharge Instructions      Start taking Augmentin twice daily for 7 days for ear infection. You can take Tessalon every 8 hours as needed for cough. You can take Imodium as needed for diarrhea. Otherwise you can continue over-the counter medication you have been taking for symptoms. You can also continue using albuterol inhaler as needed. Return here as needed.     ED Prescriptions     Medication Sig Dispense Auth. Provider   amoxicillin-clavulanate (AUGMENTIN) 875-125 MG tablet Take 1 tablet by mouth every 12 (twelve) hours. 14 tablet Susann Givens, Francie Keeling A, NP   benzonatate (TESSALON) 100 MG capsule Take 1 capsule (100 mg total) by mouth every 8 (eight)  hours as needed for cough. 21 capsule Wynonia Lawman A, NP   loperamide (IMODIUM) 2 MG capsule Take 1 capsule (2 mg total) by mouth 4 (four) times daily as needed for diarrhea or loose stools. 12 capsule Wynonia Lawman A, NP      PDMP not reviewed this encounter.   Wynonia Lawman A, NP 07/22/23 1623

## 2023-07-23 NOTE — Telephone Encounter (Signed)
Called patient and left a message for a call back.  

## 2023-07-24 NOTE — Telephone Encounter (Signed)
Called patient and left a message for a call back.

## 2023-07-29 ENCOUNTER — Other Ambulatory Visit: Payer: 59

## 2023-07-30 NOTE — Telephone Encounter (Signed)
Sent Mychart message to patient.

## 2023-08-04 DIAGNOSIS — J45998 Other asthma: Secondary | ICD-10-CM | POA: Diagnosis not present
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
## 2023-08-15 ENCOUNTER — Ambulatory Visit: Payer: 59 | Admitting: Physician Assistant

## 2023-08-26 ENCOUNTER — Encounter (HOSPITAL_COMMUNITY): Payer: Self-pay | Admitting: Emergency Medicine

## 2023-08-26 ENCOUNTER — Ambulatory Visit (HOSPITAL_COMMUNITY)
Admission: EM | Admit: 2023-08-26 | Discharge: 2023-08-26 | Disposition: A | Discharge status: Home / Self Care | Payer: 59

## 2023-08-26 DIAGNOSIS — T63441A Toxic effect of venom of bees, accidental (unintentional), initial encounter: Secondary | ICD-10-CM

## 2023-08-26 MED ORDER — TRIAMCINOLONE ACETONIDE 0.1 % EX CREA: 1.0000 | TOPICAL_CREAM | Freq: Two times a day (BID) | CUTANEOUS | 0 refills | Status: AC

## 2023-08-26 MED ORDER — DEXAMETHASONE SODIUM PHOSPHATE 10 MG/ML IJ SOLN
INTRAMUSCULAR | Status: AC
  Filled 2023-08-26: qty 1

## 2023-08-26 MED ORDER — DEXAMETHASONE SODIUM PHOSPHATE 10 MG/ML IJ SOLN
10.0000 mg | Freq: Once | INTRAMUSCULAR | Status: AC
  Administered 2023-08-26: 10 mg via INTRAMUSCULAR
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
## 2023-08-26 NOTE — Discharge Instructions (Addendum)
You have been evaluated today for an allergic reaction. We gave you medicine to help with symptoms.   Apply triamcinolone cream to the site of the bee sting every 12 hours as needed for itching for up to 7-10 days. You may take an over the counter antihistamine (Claritin or Zyrtec) for the next 5-7 days as well as a medication called famotidine (Pepcid) over the counter 20mg  daily for 5-7 days to further reduce your symptoms.   Please schedule an appointment with your primary care provider for follow-up and ongoing management. Return if you experience rashes, difficulty breathing or swallowing, lip/mouth/tongue swelling, vomiting, or for any other concerning symptoms. If symptoms are severe, please go to the ER for further workup.

## 2023-08-28 ENCOUNTER — Other Ambulatory Visit: Payer: 59

## 2023-09-03 DIAGNOSIS — J45998 Other asthma: Secondary | ICD-10-CM | POA: Diagnosis not present

## 2023-09-22 NOTE — Progress Notes (Unsigned)
Cardiology Office Note:    Date:  09/24/2023   ID:  Melissa Pham, DOB 1966/09/09, MRN [id number]  PCP:  Claiborne Rigg, NP   [facility] Providers Cardiologist:  Nanetta Batty, MD     Referring MD: Claiborne Rigg, NP   Chief Complaint  Patient presents with   Follow-up    CAD, HTN, HLD    History of Present Illness:    Melissa Pham is a 57 y.o. female with a hx of bipolar disorder, schizophrenia, ADHD, GERD, DM 2, mild nonobstructive CAD, hypertension, and hyperlipidemia.  She had a CT coronary 12/12/2017 that showed an elevated coronary calcium score of 93 placing her at the 91st percentile and mild stenosis of the proximal LCX but no obstructive CAD.  Risk factor modification was recommended.  Echocardiogram at that time revealed an LVEF 55-60%, mild LVH, no RWMA, and no significant valvular disease.  She was hospitalized 12/06/2018 after syncopal episode while driving.  Cardiac enzymes were negative and EKG and telemetry were unremarkable.  Nuclear stress test was obtained and was nonischemic.  Heart monitor 12/2018 with a rare PVC, no evidence of significant bradycardia, high-grade heart block, or pauses to explain syncope.  She abided by 65-month driving restrictions.  She reestablish care with Dr. Gery Pray 09/19/2022 for evaluation of risk factor modification.  She is a current smoker with no history of MI or stroke.  She presented to [facility] ED 03/14/2023 for chest pain.  She ruled out with negative enzymes and nonischemic EKG.  She was discharged without admission. She was seen at [facility] ED 04/24/2023 for chest pain and numbness.  She ruled out with negative enzymes and nonischemic EKG.  She was discharged without admission.  She presents today for follow-up. She was seen in the ER last night for a hand laceration. She works in an Industrial/product designer with adults with dementia, stroke, etc. No chest pain, but is active and reports SOB at times with extreme exertion. Inhaler helps  with DOE.  She reports left neck pain that shoots down her left arm that progresses to chest tightness, relieved with 2 ASA. She is exercising most days of the week including zumba and bootcamp. She is also learning to swim (lessons). No chest pain during exercising - she usually has chest discomfort in times of stress (has lost 4 children). History is a little difficult to follow, but this is similar to the pain she had in May and June.   Past Medical History:  Diagnosis Date   ADHD (attention deficit hyperactivity disorder)    Anxiety    Asthma    Bipolar disorder (HCC)    Blood transfusion without reported diagnosis 11/05/1983   after childbirth   Coronary artery disease    Depression    Diabetes mellitus without complication (HCC)    GERD (gastroesophageal reflux disease)    History of illicit drug use    Hyperlipidemia    Hypertension    Schizophrenia (HCC)    Sickle cell trait (HCC)    Tobacco use     Past Surgical History:  Procedure Laterality Date   CERVICAL BIOPSY  W/ LOOP ELECTRODE EXCISION     left eye surgery  1970   TUBAL LIGATION  1989    Current Medications: Current Meds  Medication Sig   albuterol (PROVENTIL) (2.5 MG/3ML) 0.083% nebulizer solution Take 3 mLs (2.5 mg total) by nebulization every 6 (six) hours as needed for wheezing or shortness of breath.   albuterol (VENTOLIN HFA)  108 (90 Base) MCG/ACT inhaler Inhale 2 puffs into the lungs every 6 (six) hours as needed for wheezing or shortness of breath.   aspirin EC 81 MG tablet Take 81 mg by mouth daily.   busPIRone (BUSPAR) 7.5 MG tablet Take 1 tablet (7.5 mg total) by mouth 2 (two) times daily. For anxiety   glucose blood (ACCU-CHEK GUIDE) test strip Use as instructed. Check blood glucose by fingerstick twice per day.   ibuprofen (ADVIL) 400 MG tablet TAKE 1 TABLET BY MOUTH EVERY 6 HOURS AS NEEDED FOR MODERATE PAIN   nitroGLYCERIN (NITROSTAT) 0.4 MG SL tablet Place 1 tablet (0.4 mg total) under the tongue  every 5 (five) minutes as needed for chest pain.   valACYclovir (VALTREX) 1000 MG tablet TAKE 1 TABLET BY MOUTH THREE TIMES DAILY     Allergies:   Sulfa antibiotics   Social History   Socioeconomic History   Marital status: Single    Spouse name: Not on file   Number of children: Not on file   Years of education: Not on file   Highest education level: Not on file  Occupational History   Not on file  Tobacco Use   Smoking status: Former    Current packs/day: 0.25    Average packs/day: 0.3 packs/day for 4.0 years (1.0 ttl pk-yrs)    Types: Cigarettes   Smokeless tobacco: Never  Vaping Use   Vaping status: Never Used  Substance and Sexual Activity   Alcohol use: No    Alcohol/week: 0.0 standard drinks of alcohol   Drug use: No    Comment: no drug use for 7 years   Sexual activity: Not Currently    Birth control/protection: Surgical  Other Topics Concern   Not on file  Social History Narrative   Are you right handed or left handed? Right Handed    Are you currently employed ? Yes   What is your current occupation?   Do you live at home alone? No   Who lives with you? Just her dog    What type of home do you live in: 1 story or 2 story? Two story home           Daughter got killed. Hit by a bus.   Social Determinants of Health   Financial Resource Strain: Low Risk  (01/20/2023)   Overall Financial Resource Strain (CARDIA)    Difficulty of Paying Living Expenses: Not hard at all  Food Insecurity: Food Insecurity Present (01/20/2023)   Hunger Vital Sign    Worried About Running Out of Food in the Last Year: Often true    Ran Out of Food in the Last Year: Often true  Transportation Needs: No Transportation Needs (01/20/2023)   PRAPARE - Administrator, Civil Service (Medical): No    Lack of Transportation (Non-Medical): No  Physical Activity: Insufficiently Active (01/20/2023)   Exercise Vital Sign    Days of Exercise per Week: 1 day    Minutes of Exercise  per Session: 20 min  Stress: Stress Concern Present (01/20/2023)   [employer] of Occupational Health - Occupational Stress Questionnaire    Feeling of Stress : To some extent  Social Connections: Moderately Integrated (01/20/2023)   Social Connection and Isolation Panel [NHANES]    Frequency of Communication with Friends and Family: More than three times a week    Frequency of Social Gatherings with Friends and Family: More than three times a week    Attends Religious Services:  1 to 4 times per year    Active Member of Clubs or Organizations: Yes    Attends Banker Meetings: 1 to 4 times per year    Marital Status: Never married     Family History: The patient's =family history includes Arthritis in her mother; Breast cancer in her paternal aunt and sister; Breast cancer (age of onset: 92) in her sister; Cancer in her sister; Colon cancer (age of onset: 72) in her maternal grandmother; Diabetes in her father; Heart attack in her father; Hyperlipidemia in her father and mother; Hypertension in her father; Kidney disease in an other family member; Pancreatic disease in an other family member; Parkinson's disease in her mother; Sickle cell anemia in an other family member; Stroke in an other family member. There is no history of Esophageal cancer or Rectal cancer.  ROS:   Please see the history of present illness.     All other systems reviewed and are negative.  EKGs/Labs/Other Studies Reviewed:    The following studies were reviewed today:  Cardiac Studies & Procedures     STRESS TESTS  MYOCARDIAL PERFUSION IMAGING 12/31/2018  Narrative  Nuclear stress EF: 68%.  There was no ST segment deviation noted during stress.  Defect 1: There is a medium defect of mild severity present in the basal anterior, mid anterior and apical anterior location, which is fixed.  This is a low risk study.  The left ventricular ejection fraction is hyperdynamic (>65%).  Medium  size, mild perfusion defect in the anterior wall from base to apex is fixed. Likely secondary to breast attenuation with normal EF and normal wall motion in this region. No ischemia noted on perfusion imaging.   ECHOCARDIOGRAM  ECHOCARDIOGRAM COMPLETE 12/11/2017  Narrative *[place]* *[facility]* [address] [place], [place] [address] [phone]  ------------------------------------------------------------------- Transthoracic Echocardiography  Patient:    Melecia, Cremeans MR #:       [id number] Study Date: 12/11/2017 Gender:     F Age:        65 Height:     157.5 cm Weight:     89.8 kg BSA:        2.03 m^2 Pt. Status: Room:       6E08C  PERFORMING   W. Viann Fish, MD [facility] ADMITTING    Briscoe Deutscher SONOGRAPHER  Celene Skeen, RDCS ATTENDING    Betsy Pries REFERRING    Andreas Newport J  cc:  ------------------------------------------------------------------- LV EF: 55% -   60%  ------------------------------------------------------------------- History:   PMH:  Chest Pain.  Risk factors:  Hypertension. Diabetes mellitus. Dyslipidemia.  ------------------------------------------------------------------- Study Conclusions  - Left ventricle: The cavity size was normal. There was mild concentric hypertrophy. Systolic function was normal. The estimated ejection fraction was in the range of 55% to 60%. Wall motion was normal; there were no regional wall motion abnormalities. - Mitral valve: Valve area by pressure half-time: 1.85 cm^2.  ------------------------------------------------------------------- Study data:  Comparison was made to the study of 02/21/2015.  Study status:  Routine.  Procedure:  The patient reported no pain pre or post test. Transthoracic echocardiography. Image quality was adequate.  Study completion:  There were no complications. Transthoracic echocardiography.  M-mode,  complete 2D, spectral Doppler, and color Doppler.  Birthdate:  Patient birthdate: [birth year].  Age:  Patient is 57 yr old.  Sex:  Gender: female. BMI: 36.2 kg/m^2.  Blood pressure:     103/67  Patient status: Inpatient.  Study date:  Study date: 12/11/2017. Study time: 11:28 AM.  Location:  Emergency department.  -------------------------------------------------------------------  ------------------------------------------------------------------- Left ventricle:  The cavity size was normal. There was mild concentric hypertrophy. Systolic function was normal. The estimated ejection fraction was in the range of 55% to 60%. Wall motion was normal; there were no regional wall motion abnormalities.  ------------------------------------------------------------------- Aortic valve:   Trileaflet; normal thickness leaflets. Mobility was not restricted.  Doppler:  Transvalvular velocity was within the normal range. There was no stenosis. There was no regurgitation.  ------------------------------------------------------------------- Aorta:  Aortic root: The aortic root was normal in size.  ------------------------------------------------------------------- Mitral valve:   Structurally normal valve.   Mobility was not restricted.  Doppler:  Transvalvular velocity was within the normal range. There was no evidence for stenosis. There was no regurgitation.    Valve area by pressure half-time: 1.85 cm^2. Indexed valve area by pressure half-time: 0.91 cm^2/m^2.    Peak gradient (D): 2 mm Hg.  ------------------------------------------------------------------- Left atrium:  The atrium was normal in size.  ------------------------------------------------------------------- Right ventricle:  The cavity size was normal. Wall thickness was normal. Systolic function was normal.  ------------------------------------------------------------------- Pulmonic valve:   Not well visualized.  Doppler:   Transvalvular velocity was within the normal range. There was no evidence for stenosis.  ------------------------------------------------------------------- Tricuspid valve:   Structurally normal valve.    Doppler: Transvalvular velocity was within the normal range. There was mild regurgitation.  ------------------------------------------------------------------- Right atrium:  The atrium was normal in size.  ------------------------------------------------------------------- Pericardium:  There was no pericardial effusion.  ------------------------------------------------------------------- Systemic veins: Inferior vena cava: The vessel was normal in size.  ------------------------------------------------------------------- Measurements  Left ventricle                           Value          Reference LV ID, ED, PLAX chordal        (L)       40    mm       43 - 52 LV ID, ES, PLAX chordal                  32    mm       23 - 38 LV fx shortening, PLAX chordal (L)       20    %        >=29 LV PW thickness, ED                      11    mm       --------- IVS/LV PW ratio, ED                      1.09           <=1.3 Stroke volume, 2D                        68    ml       --------- Stroke volume/bsa, 2D                    34    ml/m^2   --------- LV e&', lateral                           12.9  cm/s     --------- LV E/e&',  lateral                         5.71           --------- LV e&', medial                            11.2  cm/s     --------- LV E/e&', medial                          6.58           --------- LV e&', average                           12.05 cm/s     --------- LV E/e&', average                         6.12           ---------  Ventricular septum                       Value          Reference IVS thickness, ED                        12    mm       ---------  LVOT                                     Value          Reference LVOT ID, S                                19    mm       --------- LVOT area                                2.84  cm^2     --------- LVOT peak velocity, S                    107   cm/s     --------- LVOT mean velocity, S                    68.5  cm/s     --------- LVOT VTI, S                              23.8  cm       --------- LVOT peak gradient, S                    5     mm Hg    ---------  Aorta                                    Value          Reference Aortic root ID, ED  28    mm       ---------  Left atrium                              Value          Reference LA ID, A-P, ES                           35    mm       --------- LA ID/bsa, A-P                           1.73  cm/m^2   <=2.2 LA volume, S                             43.2  ml       --------- LA volume/bsa, S                         21.3  ml/m^2   --------- LA volume, ES, 1-p A4C                   37.7  ml       --------- LA volume/bsa, ES, 1-p A4C               18.6  ml/m^2   --------- LA volume, ES, 1-p A2C                   40.1  ml       --------- LA volume/bsa, ES, 1-p A2C               19.8  ml/m^2   ---------  Mitral valve                             Value          Reference Mitral E-wave peak velocity              73.7  cm/s     --------- Mitral A-wave peak velocity              40.7  cm/s     --------- Mitral deceleration time       (H)       352   ms       150 - 230 Mitral pressure half-time                103   ms       --------- Mitral peak gradient, D                  2     mm Hg    --------- Mitral E/A ratio, peak                   1.8            --------- Mitral valve area, PHT, DP               1.85  cm^2     --------- Mitral valve area/bsa, PHT, DP           0.91  cm^2/m^2 ---------  Right atrium  Value          Reference RA ID, S-I, ES, A4C            (H)       51.4  mm       34 - 49 RA area, ES, A4C                         12.5  cm^2     8.3 - 19.5 RA volume, ES, A/L                        25.4  ml       --------- RA volume/bsa, ES, A/L                   12.5  ml/m^2   ---------  Right ventricle                          Value          Reference RV ID, minor axis, ED, A4C               35    mm       --------- base RV ID, minor axis, ED, A4C mid           29    mm       --------- RV ID, major axis, ED, A4C               55    mm       55 - 91 TAPSE                                    17.1  mm       --------- RV s&', lateral, S                        11.3  cm/s     ---------  Legend: (L)  and  (H)  mark values outside specified reference range.  ------------------------------------------------------------------- Prepared and Electronically Authenticated by  Georga Hacking, MD [facility] 2019-02-07T17:41:29    MONITORS  CARDIAC EVENT MONITOR 12/08/2018  Narrative  Sinus bradycardia, normal sinus rhythm and sinus tachycardia. The average heart rate was 90bpm. The heart rate ranged from 50-127bpm.  Rare PVC   CT SCANS  CT CORONARY MORPH W/CTA COR W/SCORE 12/12/2017  Addendum 12/12/2017  5:23 PM ADDENDUM REPORT: 12/12/2017 17:21  CLINICAL DATA:  Chest pain  EXAM: Cardiac CTA  MEDICATIONS: Sub lingual nitro. 4mg  x 2 and lopressor 5mg  IV  TECHNIQUE: The patient was scanned on a Siemens 192 slice scanner. Gantry rotation speed was 240 msecs. Collimation was 0.6 mm. A 100 kV prospective scan was triggered in the ascending thoracic aorta at 35-75% of the R-R interval. Average HR during the scan was 65 bpm. The 3D data set was interpreted on a dedicated work station using MPR, MIP and VRT modes. A total of 80cc of contrast was used.  FINDINGS: Non-cardiac: See separate report from [facility].  Calcium Score: 33 Agatston units  Coronary Arteries: Right dominant with no anomalies  LM: No plaque or stenosis.  LAD system: No plaque or stenosis.  Circumflex system: Mixed plaque with mild stenosis in the ostial LCx. No other significant disease in the LCx  system.  RCA system: No plaque or stenosis.  IMPRESSION: 1. Coronary artery calcium score 93 Agatston units. This places the patient in the 91st percentile for age and gender, suggesting high risk for future cardiac events.  2. Mild stenosis proximal LCx. No obstructive coronary disease noted.  Dalton Mclean   Electronically Signed By: Marca Ancona M.D. On: 12/12/2017 17:21  Narrative EXAM: OVER-READ INTERPRETATION CT CHEST  The following report is an over-read performed by radiologist Dr. Hulan Saas of [facility] on 12/12/2017. This over-read does not include interpretation of cardiac or coronary anatomy or pathology. The coronary calcium score/coronary CTA interpretation by the cardiologist is attached.  COMPARISON:  CT chest 07/09/2010.  FINDINGS: Vascular: No visible thoracic or upper abdominal aortic atherosclerosis. No evidence of aortic aneurysm.  Mediastinum/Nodes: No pathologic lymphadenopathy within the visualized mediastinum. Visualized esophagus normal in appearance.  Lungs/Pleura: Emphysematous changes throughout the visualized portions of both lungs. Minimal scarring involving the lingula and left lower lobe. Lung parenchyma otherwise clear. Central airways patent without significant bronchial wall thickening. No pleural effusions.  Upper Abdomen: Extreme upper abdomen unremarkable for the early arterial phase of enhancement.  Musculoskeletal: Spondylosis involving the visualized mid and lower thoracic spine. No acute abnormality.  IMPRESSION: 1.  Emphysema (ICD10-J43.9). 2. No significant extracardiac findings otherwise.  Electronically Signed: By: Hulan Saas M.D. On: 12/12/2017 16:10                Recent Labs: 05/03/2023: BUN 7; Creatinine, Ser 0.70; Hemoglobin 12.2; Platelets 265; Potassium 3.2; Sodium 141  Recent Lipid Panel    Component Value Date/Time   CHOL 235 (H) 11/22/2022 0953   TRIG 165 (H)  11/22/2022 0953   HDL 44 11/22/2022 0953   CHOLHDL 5.3 (H) 11/22/2022 0953   CHOLHDL 4.6 12/07/2018 0439   VLDL 19 12/07/2018 0439   LDLCALC 161 (H) 11/22/2022 0953     Risk Assessment/Calculations:                Physical Exam:    VS:  BP 116/88   Pulse 66   Ht 5\' 2"  (1.575 m)   Wt 182 lb 9.6 oz (82.8 kg)   LMP 07/21/2013   SpO2 93%   BMI 33.40 kg/m     Wt Readings from Last 3 Encounters:  09/24/23 182 lb 9.6 oz (82.8 kg)  09/23/23 171 lb (77.6 kg)  05/12/23 171 lb (77.6 kg)     GEN:  Well nourished, well developed in no acute distress HEENT: Normal NECK: No JVD; No carotid bruits LYMPHATICS: No lymphadenopathy CARDIAC: RRR, no murmurs, rubs, gallops RESPIRATORY:  Clear to auscultation without rales, wheezing or rhonchi  ABDOMEN: Soft, non-tender, non-distended MUSCULOSKELETAL:  No edema; No deformity  SKIN: Warm and dry NEUROLOGIC:  Alert and oriented x 3 PSYCHIATRIC:  Normal affect   ASSESSMENT:    1. Precordial chest pain   2. Coronary artery disease of native artery of native heart with stable angina pectoris (HCC)   3. Tobacco abuse   4. Hyperlipidemia with target LDL less than 70   5. Primary hypertension   6. Type 2 diabetes mellitus without complication, without long-term current use of insulin (HCC)   7. Class 2 obesity in adult, unspecified BMI, unspecified obesity type, unspecified whether serious comorbidity present    PLAN:    In order of problems listed above:  Chest pain - sounds somewhat atypical, but she has known nonobstructive disease - will increase her anti-anginal regimen with 2.5 mg  amlodipine - given risk factors of DM, tobacco abuse, and known nonobstructive disease, will schedule a PET stress test - will start 2.5 mg amlodipine at night and refill nitroglycerin SL   Nonobstructive CAD Reassuring CT coronary in 2019 - nonobstructive Nonischemic nuclear stress test in 2020   Hypertension Maintained on metoprolol and  lisinopril BP well controlled   Hyperlipidemia with LDL goal less than 70 11/22/2022: Cholesterol, Total 235; HDL 44; LDL Chol Calc (NIH) 161; Triglycerides 165 Listed as taking 40 mg simvastatin but at last visit patient states she was not taking - today she states she is taking this Will recheck lipid panel today   Tobacco abuse Counseled on smoking cessation She quit 14 days ago   Obesity She has lost weight  - size 28 to 18    Follow up in 2 months.      Informed Consent   Shared Decision Making/Informed Consent The risks [chest pain, shortness of breath, cardiac arrhythmias, dizziness, blood pressure fluctuations, myocardial infarction, stroke/transient ischemic attack, nausea, vomiting, allergic reaction, radiation exposure, metallic taste sensation and life-threatening complications (estimated to be 1 in 10,000)], benefits (risk stratification, diagnosing coronary artery disease, treatment guidance) and alternatives of a cardiac PET stress test were discussed in detail with Ms. Hasser and she agrees to proceed.       Medication Adjustments/Labs and Tests Ordered: Current medicines are reviewed at length with the patient today.  Concerns regarding medicines are outlined above.  Orders Placed This Encounter  Procedures   NM PET CT CARDIAC PERFUSION MULTI W/ABSOLUTE BLOODFLOW   Lipid panel   Lipoprotein A (LPA)   Meds ordered this encounter  Medications   nitroGLYCERIN (NITROSTAT) 0.4 MG SL tablet    Sig: Place 1 tablet (0.4 mg total) under the tongue every 5 (five) minutes as needed for chest pain.    Dispense:  25 tablet    Refill:  5    Patient Instructions  Medication Instructions:  Start Amlodipine 2.5 mg today Nitroglycerin .4 mg/sl take as needed for chest pain.  *If you need a refill on your cardiac medications before your next appointment, please call your pharmacy*   Lab Work: Fasting Lipid panel & LPA today   Testing/Procedures: How to Prepare  for Your Cardiac PET/CT Stress Test:  1. Please do not take these medications before your test:   Medications that may interfere with the cardiac pharmacological stress agent (ex. nitrates - including erectile dysfunction medications, isosorbide mononitrate- [please start to hold this medication the day before the test], tamulosin or beta-blockers) the day of the exam. (Erectile dysfunction medication should be held for at least 72 hrs prior to test) Theophylline containing medications for 12 hours. Dipyridamole 48 hours prior to the test. Your remaining medications may be taken with water.  2. Nothing to eat or drink, except water, 3 hours prior to arrival time.   NO caffeine/decaffeinated products, or chocolate 12 hours prior to arrival.  3. NO perfume, cologne or lotion on chest or abdomen area.          - FEMALES - Please avoid wearing dresses to this appointment.  4. Total time is 1 to 2 hours; you may want to bring reading material for the waiting time.  5. Please report to Radiology at the [facility] Main Entrance 30 minutes early for your test.  [address] [address] [place], [place] [address]  6. Please report to Radiology at [facility] Main Entrance, medical mall, 30  mins prior to your test.  [address]  [place], [place]  [phone]  Diabetic Preparation:  Hold oral medications. You may take NPH and Lantus insulin. Do not take Humalog or Humulin R (Regular Insulin) the day of your test. Check blood sugars prior to leaving the house. If able to eat breakfast prior to 3 hour fasting, you may take all medications, including your insulin, Do not worry if you miss your breakfast dose of insulin - start at your next meal. Patients who wear a continuous glucose monitor MUST remove the device prior to scanning.  IF YOU THINK YOU MAY BE PREGNANT, OR ARE NURSING PLEASE INFORM THE TECHNOLOGIST.  In preparation for your appointment,  medication and supplies will be purchased.  Appointment availability is limited, so if you need to cancel or reschedule, please call the Radiology Department at [phone] [facility]) OR [phone] [facility])  24 hours in advance to avoid a cancellation fee of $100.00  What to Expect After you Arrive:  Once you arrive and check in for your appointment, you will be taken to a preparation room within the Radiology Department.  A technologist or Nurse will obtain your medical history, verify that you are correctly prepped for the exam, and explain the procedure.  Afterwards,  an IV will be started in your arm and electrodes will be placed on your skin for EKG monitoring during the stress portion of the exam. Then you will be escorted to the PET/CT scanner.  There, staff will get you positioned on the scanner and obtain a blood pressure and EKG.  During the exam, you will continue to be connected to the EKG and blood pressure machines.  A small, safe amount of a radioactive tracer will be injected in your IV to obtain a series of pictures of your heart along with an injection of a stress agent.    After your Exam:  It is recommended that you eat a meal and drink a caffeinated beverage to counter act any effects of the stress agent.  Drink plenty of fluids for the remainder of the day and urinate frequently for the first couple of hours after the exam.  Your doctor will inform you of your test results within 7-10 business days.  For more information and frequently asked questions, please visit our website : [email or web address]  For questions about your test or how to prepare for your test, please call: Cardiac Imaging Nurse Navigators Office: [phone]    Follow-Up: At [facility], you and your health needs are our priority.  As part of our continuing mission to provide you with exceptional heart care, we have created designated Provider Care Teams.  These Care Teams include  your primary Cardiologist (physician) and Advanced Practice Providers (APPs -  Physician Assistants and Nurse Practitioners) who all work together to provide you with the care you need, when you need it.  We recommend signing up for the patient portal called "MyChart".  Sign up information is provided on this After Visit Summary.  MyChart is used to connect with patients for Virtual Visits (Telemedicine).  Patients are able to view lab/test results, encounter notes, upcoming appointments, etc.  Non-urgent messages can be sent to your provider as well.   To learn more about what you can do with MyChart, go to [email or web address].    Your next appointment:   2 month(s)  Provider:   Micah Flesher, PA-C  Signed, Marcelino Duster, PA  09/24/2023 2:24 PM    [facility]

## 2023-09-23 ENCOUNTER — Other Ambulatory Visit: Payer: Self-pay

## 2023-09-23 ENCOUNTER — Emergency Department (HOSPITAL_COMMUNITY)
Admission: EM | Admit: 2023-09-23 | Discharge: 2023-09-23 | Disposition: A | Discharge status: Home / Self Care | Payer: 59 | Attending: Emergency Medicine | Admitting: Emergency Medicine

## 2023-09-23 ENCOUNTER — Encounter (HOSPITAL_COMMUNITY): Payer: Self-pay | Admitting: Emergency Medicine

## 2023-09-23 DIAGNOSIS — W268XXA Contact with other sharp object(s), not elsewhere classified, initial encounter: Secondary | ICD-10-CM | POA: Diagnosis not present

## 2023-09-23 DIAGNOSIS — S61412A Laceration without foreign body of left hand, initial encounter: Secondary | ICD-10-CM

## 2023-09-23 DIAGNOSIS — E119 Type 2 diabetes mellitus without complications: Secondary | ICD-10-CM | POA: Diagnosis not present

## 2023-09-23 DIAGNOSIS — Z79899 Other long term (current) drug therapy: Secondary | ICD-10-CM | POA: Insufficient documentation

## 2023-09-23 DIAGNOSIS — I10 Essential (primary) hypertension: Secondary | ICD-10-CM | POA: Diagnosis not present

## 2023-09-23 DIAGNOSIS — S6992XA Unspecified injury of left wrist, hand and finger(s), initial encounter: Secondary | ICD-10-CM | POA: Diagnosis present

## 2023-09-23 DIAGNOSIS — Z23 Encounter for immunization: Secondary | ICD-10-CM | POA: Insufficient documentation

## 2023-09-23 DIAGNOSIS — Z7984 Long term (current) use of oral hypoglycemic drugs: Secondary | ICD-10-CM | POA: Insufficient documentation

## 2023-09-23 MED ORDER — LIDOCAINE HCL (PF) 1 % IJ SOLN
5.0000 mL | Freq: Once | INTRAMUSCULAR | Status: AC
  Administered 2023-09-23: 5 mL
  Filled 2023-09-23: qty 5

## 2023-09-23 MED ORDER — TETANUS-DIPHTH-ACELL PERTUSSIS 5-2.5-18.5 LF-MCG/0.5 IM SUSY
0.5000 mL | PREFILLED_SYRINGE | Freq: Once | INTRAMUSCULAR | Status: AC
  Administered 2023-09-23: 0.5 mL via INTRAMUSCULAR
  Filled 2023-09-23: qty 0.5

## 2023-09-23 MED ORDER — BACITRACIN ZINC 500 UNIT/GM EX OINT
TOPICAL_OINTMENT | Freq: Two times a day (BID) | CUTANEOUS | Status: DC
  Administered 2023-09-23: 1 via TOPICAL
  Filled 2023-09-23: qty 0.9

## 2023-09-23 NOTE — Discharge Instructions (Signed)
Please follow-up with your primary care provider, urgent care, he ER to have the sutures removed in 10 to 14 days.  He had 4 sutures placed.  If you begin to have redness in the area, warmth, rigid compartments, pain moving the hand, fevers or other worsening symptoms please return to ER.  You may keep the wound clean and dry and use Tylenol every 6 hours as needed for pain.  You may use Neosporin daily.

## 2023-09-23 NOTE — ED Provider Notes (Cosign Needed Addendum)
[place] EMERGENCY DEPARTMENT AT [facility] Provider Note   CSN: [id number] Arrival date & time: 09/23/23  0444     History  Chief Complaint  Patient presents with   Laceration    Melissa Pham is a 57 y.o. female history of diabetes, hypertension presented after a laceration to her left palm that occurred at 4 AM this morning and she was slicing food.  Patient states she was getting breakfast before she going to her workout class when she excellently sliced her left palm.  Patient denies any blood thinners, decree sensation, paresthesias/weakness, unable to see tendon or bone.  Patient may move her hand but states that there is a slight discomfort.  Patient is unsure of last tetanus.  Home Medications Prior to Admission medications   Medication Sig Start Date End Date Taking? Authorizing Provider  albuterol (PROVENTIL) (2.5 MG/3ML) 0.083% nebulizer solution Take 3 mLs (2.5 mg total) by nebulization every 6 (six) hours as needed for wheezing or shortness of breath. 02/19/23   Claiborne Rigg, NP  albuterol (VENTOLIN HFA) 108 (90 Base) MCG/ACT inhaler Inhale 2 puffs into the lungs every 6 (six) hours as needed for wheezing or shortness of breath. 07/15/23   Claiborne Rigg, NP  amoxicillin-clavulanate (AUGMENTIN) 875-125 MG tablet Take 1 tablet by mouth every 12 (twelve) hours. 07/22/23   Wynonia Lawman A, NP  aspirin EC 81 MG tablet Take 81 mg by mouth daily.    [provider]  benzonatate (TESSALON) 100 MG capsule Take 1 capsule (100 mg total) by mouth every 8 (eight) hours as needed for cough. 07/22/23   Wynonia Lawman A, NP  busPIRone (BUSPAR) 7.5 MG tablet Take 1 tablet (7.5 mg total) by mouth 2 (two) times daily. For anxiety 09/17/22   Claiborne Rigg, NP  cyclobenzaprine (FLEXERIL) 5 MG tablet Take 1 tablet (5 mg total) by mouth at bedtime. 05/15/23   Patel, Roxana Hires K, DO  dicyclomine (BENTYL) 20 MG tablet Take 1 tablet (20 mg total) by mouth 2 (two) times  daily. 07/30/22   Smoot, Shawn Route, PA-C  fluticasone-salmeterol (ADVAIR HFA) 230-21 MCG/ACT inhaler Inhale 2 puffs into the lungs 2 (two) times daily. 03/14/23   Claiborne Rigg, NP  gabapentin (NEURONTIN) 300 MG capsule Take 1 capsule (300 mg total) by mouth at bedtime. 09/17/22   Claiborne Rigg, NP  glimepiride (AMARYL) 2 MG tablet Take 1 tablet by mouth once daily with breakfast 04/07/23   Claiborne Rigg, NP  glucose blood (ACCU-CHEK GUIDE) test strip Use as instructed. Check blood glucose by fingerstick twice per day. 09/17/22   Claiborne Rigg, NP  hydrocortisone (ANUSOL-HC) 2.5 % rectal cream Place 1 Application rectally 2 (two) times daily. 07/29/22   White, Elita Boone, NP  ibuprofen (ADVIL) 400 MG tablet TAKE 1 TABLET BY MOUTH EVERY 6 HOURS AS NEEDED FOR MODERATE PAIN 07/08/23   Claiborne Rigg, NP  Lidocaine-Glycerin (PREPARATION H RE) Place 1 application  rectally as needed (hemmroids).    [provider]  lisinopril (ZESTRIL) 5 MG tablet Take 1 tablet (5 mg total) by mouth daily. 09/17/22   Claiborne Rigg, NP  loperamide (IMODIUM) 2 MG capsule Take 1 capsule (2 mg total) by mouth 4 (four) times daily as needed for diarrhea or loose stools. 07/22/23   Wynonia Lawman A, NP  meloxicam (MOBIC) 15 MG tablet Take 1 tablet (15 mg total) by mouth daily. 02/19/23   Claiborne Rigg, NP  MELOXICAM PO Take  by mouth.    [provider]  metoprolol succinate (TOPROL-XL) 50 MG 24 hr tablet Take 1 tablet (50 mg total) by mouth daily. Take with or immediately following a meal. 09/17/22   Claiborne Rigg, NP  pantoprazole (PROTONIX) 40 MG tablet Take 1 tablet (40 mg total) by mouth daily. 09/17/22   Claiborne Rigg, NP  PARoxetine (PAXIL) 20 MG tablet Take 1 tablet by mouth once daily 04/07/23   Claiborne Rigg, NP  simvastatin (ZOCOR) 40 MG tablet Take 1 tablet (40 mg total) by mouth daily. 09/17/22   Claiborne Rigg, NP  sucralfate (CARAFATE) 1 g tablet Take 1 tablet (1 g total) by  mouth 2 (two) times daily. 03/27/22   Meccariello, Solmon Ice, MD  SYMBICORT 160-4.5 MCG/ACT inhaler Inhale 2 puffs into the lungs daily.    [provider]  triamcinolone cream (KENALOG) 0.1 % Apply 1 Application topically 2 (two) times daily. 08/26/23   Carlisle Beers, FNP  valACYclovir (VALTREX) 1000 MG tablet TAKE 1 TABLET BY MOUTH THREE TIMES DAILY 04/07/23   Claiborne Rigg, NP      Allergies    Sulfa antibiotics    Review of Systems   Review of Systems  Physical Exam Updated Vital Signs BP (!) 163/94   Pulse 83   Temp 98.3 F (36.8 C)   Resp (!) 24   Ht 5\' 1"  (1.549 m)   Wt 77.6 kg   LMP 07/21/2013   SpO2 100%   BMI 32.31 kg/m  Physical Exam Constitutional:      General: She is not in acute distress. Cardiovascular:     Rate and Rhythm: Normal rate.     Pulses: Normal pulses.  Musculoskeletal:     Comments: 5 out of 5 bilateral grip strength, wrist extension/flexion No bony tenderness or abnormalities Compartment soft Pain not out of proportion  Skin:    General: Skin is warm and dry.     Capillary Refill: Capillary refill takes less than 2 seconds.     Comments: 2 cm superficial laceration noted to left palm on the fifth metacarpal side that does not have any bony protrusions, surrounding erythema, warmth, purulent discharge, not actively hemorrhaging  Neurological:     Mental Status: She is alert.     Comments: Sensation intact distally     ED Results / Procedures / Treatments   Labs (all labs ordered are listed, but only abnormal results are displayed) Labs Reviewed - No data to display  EKG None  Radiology No results found.  Procedures .Marland KitchenLaceration Repair  Date/Time: 09/23/2023 10:05 AM  Performed by: Netta Corrigan, PA-C Authorized by: Netta Corrigan, PA-C   Consent:    Consent obtained:  Verbal   Consent given by:  Patient   Risks, benefits, and alternatives were discussed: yes     Risks discussed:  Infection, need for  additional repair, nerve damage, pain, poor wound healing and poor cosmetic result Universal protocol:    Procedure explained and questions answered to patient or proxy's satisfaction: yes     Patient identity confirmed:  Verbally with patient Anesthesia:    Anesthesia method:  Local infiltration   Local anesthetic:  Lidocaine 1% w/o epi Laceration details:    Location: Left hand.   Length (cm):  2   Depth (mm):  1 Treatment:    Area cleansed with:  Saline   Amount of cleaning:  Standard   Irrigation solution:  Sterile saline   Irrigation  volume:  100 mL   Irrigation method:  Pressure wash   Visualized foreign bodies/material removed: no     Debridement:  None Skin repair:    Repair method:  Sutures   Suture size:  5-0   Suture material:  Prolene   Suture technique:  Simple interrupted   Number of sutures:  4 Approximation:    Approximation:  Close Repair type:    Repair type:  Simple Post-procedure details:    Dressing:  Antibiotic ointment, bulky dressing and non-adherent dressing   Procedure completion:  Tolerated     Medications Ordered in ED Medications  lidocaine (PF) (XYLOCAINE) 1 % injection 5 mL (has no administration in time range)  Tdap (BOOSTRIX) injection 0.5 mL (has no administration in time range)  bacitracin ointment (has no administration in time range)    ED Course/ Medical Decision Making/ A&P                                 Medical Decision Making Risk OTC drugs. Prescription drug management.   Alenia Stinar 57 y.o. presented today for a 2.0 cm laceration to their left palm. Working DDx that I considered at this time includes, but not limited to, FB, fracture, NV compromise, simple laceration.  R/o DDx: FB, fracture, NV compromise: These are considered less likely due to history of present illness and physical exam findings  Review of prior external notes: 08/26/2023 ED  Unique Tests and My Interpretation: None  Social Determinants of  Health: none  Discussion with Independent Historian: None  Discussion of Management of Tests: None  Risk: Low: based on diagnostic testing/clinical impression and treatment plan  Risk Stratification Score: None  Plan: Patient is in no acute distress and has stable vitals. They are neurovascularly intact. Tetanus is out of date and will be updated today. Patient is in no distress. Laceration will be repaired with standard wound care procedures and antibiotic ointment.   Patient/family educated about specific return precautions for given chief complaint and symptoms.  Patient/family educated about follow-up with PCP. Patient was educated to have sutures removed in Digits, palm, and sole - 10 to 14 days.  Laceration repaired without complication and with pulse, motor, sensation intact.  Patient is stable for discharge at this time. Patient/family expressed understanding of return precautions and need for follow-up. All education provided in verbal form with additional information in written form. Time was allowed for answering of patient questions. Patient discharged.   This chart was dictated using voice recognition software.  Despite best efforts to proofread,  errors can occur which can change the documentation meaning.         Final Clinical Impression(s) / ED Diagnoses Final diagnoses:  Laceration of left hand without foreign body, initial encounter    Rx / DC Orders ED Discharge Orders     None         Remi Deter 09/23/23 1007    Netta Corrigan, PA-C 09/23/23 1007    Anders Simmonds T, DO 09/24/23 [phone]

## 2023-09-23 NOTE — ED Triage Notes (Signed)
Patient coming to ED for evaluation of laceration to palm of L hand.  Reports she woke up this morning and was cooking.  Cut hand with knife.  Bleeding controlled.  Unknown tetanus

## 2023-09-24 ENCOUNTER — Ambulatory Visit: Payer: 59 | Attending: General Practice | Admitting: Physician Assistant

## 2023-09-24 ENCOUNTER — Telehealth: Payer: Self-pay

## 2023-09-24 ENCOUNTER — Encounter: Payer: Self-pay | Admitting: Physician Assistant

## 2023-09-24 VITALS — BP 116/88 | HR 66 | Ht 62.0 in | Wt 182.6 lb

## 2023-09-24 DIAGNOSIS — R072 Precordial pain: Secondary | ICD-10-CM | POA: Diagnosis not present

## 2023-09-24 DIAGNOSIS — Z720 Tobacco use: Secondary | ICD-10-CM

## 2023-09-24 DIAGNOSIS — E785 Hyperlipidemia, unspecified: Secondary | ICD-10-CM | POA: Diagnosis not present

## 2023-09-24 DIAGNOSIS — E66812 Obesity, class 2: Secondary | ICD-10-CM

## 2023-09-24 DIAGNOSIS — E119 Type 2 diabetes mellitus without complications: Secondary | ICD-10-CM

## 2023-09-24 DIAGNOSIS — I10 Essential (primary) hypertension: Secondary | ICD-10-CM | POA: Diagnosis not present

## 2023-09-24 DIAGNOSIS — I25118 Atherosclerotic heart disease of native coronary artery with other forms of angina pectoris: Secondary | ICD-10-CM

## 2023-09-24 MED ORDER — NITROGLYCERIN 0.4 MG SL SUBL: 0.4000 mg | SUBLINGUAL_TABLET | SUBLINGUAL | 5 refills | Status: AC | PRN

## 2023-09-24 NOTE — Patient Instructions (Signed)
Medication Instructions:  Start Amlodipine 2.5 mg today Nitroglycerin .4 mg/sl take as needed for chest pain.  *If you need a refill on your cardiac medications before your next appointment, please call your pharmacy*   Lab Work: Fasting Lipid panel & LPA today   Testing/Procedures: How to Prepare for Your Cardiac PET/CT Stress Test:  1. Please do not take these medications before your test:   Medications that may interfere with the cardiac pharmacological stress agent (ex. nitrates - including erectile dysfunction medications, isosorbide mononitrate- [please start to hold this medication the day before the test], tamulosin or beta-blockers) the day of the exam. (Erectile dysfunction medication should be held for at least 72 hrs prior to test) Theophylline containing medications for 12 hours. Dipyridamole 48 hours prior to the test. Your remaining medications may be taken with water.  2. Nothing to eat or drink, except water, 3 hours prior to arrival time.   NO caffeine/decaffeinated products, or chocolate 12 hours prior to arrival.  3. NO perfume, cologne or lotion on chest or abdomen area.          - FEMALES - Please avoid wearing dresses to this appointment.  4. Total time is 1 to 2 hours; you may want to bring reading material for the waiting time.  5. Please report to Radiology at the [facility] Main Entrance 30 minutes early for your test.  [address] [place], [place] [address]  6. Please report to Radiology at [facility] Main Entrance, medical mall, 30 mins prior to your test.  [address]  [place], [place]  [phone]  Diabetic Preparation:  Hold oral medications. You may take NPH and Lantus insulin. Do not take Humalog or Humulin R (Regular Insulin) the day of your test. Check blood sugars prior to leaving the house. If able to eat breakfast prior to 3 hour fasting, you may take all medications, including your  insulin, Do not worry if you miss your breakfast dose of insulin - start at your next meal. Patients who wear a continuous glucose monitor MUST remove the device prior to scanning.  IF YOU THINK YOU MAY BE PREGNANT, OR ARE NURSING PLEASE INFORM THE TECHNOLOGIST.  In preparation for your appointment, medication and supplies will be purchased.  Appointment availability is limited, so if you need to cancel or reschedule, please call the Radiology Department at [phone] [facility]) OR [phone] [facility])  24 hours in advance to avoid a cancellation fee of $100.00  What to Expect After you Arrive:  Once you arrive and check in for your appointment, you will be taken to a preparation room within the Radiology Department.  A technologist or Nurse will obtain your medical history, verify that you are correctly prepped for the exam, and explain the procedure.  Afterwards,  an IV will be started in your arm and electrodes will be placed on your skin for EKG monitoring during the stress portion of the exam. Then you will be escorted to the PET/CT scanner.  There, staff will get you positioned on the scanner and obtain a blood pressure and EKG.  During the exam, you will continue to be connected to the EKG and blood pressure machines.  A small, safe amount of a radioactive tracer will be injected in your IV to obtain a series of pictures of your heart along with an injection of a stress agent.    After your Exam:  It is recommended that you eat a  meal and drink a caffeinated beverage to counter act any effects of the stress agent.  Drink plenty of fluids for the remainder of the day and urinate frequently for the first couple of hours after the exam.  Your doctor will inform you of your test results within 7-10 business days.  For more information and frequently asked questions, please visit our website : [email or web address]  For questions about your test or how to prepare for your test,  please call: Cardiac Imaging Nurse Navigators Office: [phone]    Follow-Up: At [facility], you and your health needs are our priority.  As part of our continuing mission to provide you with exceptional heart care, we have created designated Provider Care Teams.  These Care Teams include your primary Cardiologist (physician) and Advanced Practice Providers (APPs -  Physician Assistants and Nurse Practitioners) who all work together to provide you with the care you need, when you need it.  We recommend signing up for the patient portal called "MyChart".  Sign up information is provided on this After Visit Summary.  MyChart is used to connect with patients for Virtual Visits (Telemedicine).  Patients are able to view lab/test results, encounter notes, upcoming appointments, etc.  Non-urgent messages can be sent to your provider as well.   To learn more about what you can do with MyChart, go to [email or web address].    Your next appointment:   2 month(s)  Provider:   Micah Flesher, PA-C

## 2023-09-24 NOTE — Telephone Encounter (Signed)
Melissa Pham, pt was seen in office today by Micah Flesher PA. Waiting on a return call to confirm pts medications she is taking and to discuss returning for an EKG.

## 2023-09-25 ENCOUNTER — Telehealth: Payer: Self-pay

## 2023-09-25 ENCOUNTER — Ambulatory Visit: Payer: 59 | Attending: Internal Medicine | Admitting: Physician Assistant

## 2023-09-25 ENCOUNTER — Encounter: Payer: Self-pay | Admitting: Physician Assistant

## 2023-09-25 VITALS — HR 74 | Ht 62.0 in | Wt 182.0 lb

## 2023-09-25 DIAGNOSIS — R072 Precordial pain: Secondary | ICD-10-CM

## 2023-09-25 DIAGNOSIS — E785 Hyperlipidemia, unspecified: Secondary | ICD-10-CM

## 2023-09-25 LAB — LIPID PANEL
Chol/HDL Ratio: 4.2 ratio (ref 0.0–4.4)
Cholesterol, Total: 207 mg/dL — ABNORMAL HIGH (ref 100–199)
HDL: 49 mg/dL (ref >39)
LDL Chol Calc (NIH): 142 mg/dL — ABNORMAL HIGH (ref 0–99)
Triglycerides: 87 mg/dL (ref 0–149)
VLDL Cholesterol Cal: 16 mg/dL (ref 5–40)

## 2023-09-25 LAB — LIPOPROTEIN A (LPA): Lipoprotein (a): 104.8 nmol/L — ABNORMAL HIGH (ref <75.0)

## 2023-09-25 MED ORDER — AMLODIPINE BESYLATE 2.5 MG PO TABS: 2.5000 mg | ORAL_TABLET | Freq: Every day | ORAL | 3 refills | Status: DC

## 2023-09-25 NOTE — Progress Notes (Signed)
Pt returned today to complete EKG s/p apt yesterday Wednesday 09/25/23

## 2023-09-25 NOTE — Telephone Encounter (Signed)
Spoke with pt. Pt was notified of lab results. Pt voiced understanding. Pt is aware that she will receive a call about a pharm-D apt. Order placed. Please arrange apt within 1-3 weeks.

## 2023-09-29 ENCOUNTER — Other Ambulatory Visit: Payer: Self-pay | Admitting: Nurse Practitioner

## 2023-09-29 DIAGNOSIS — E7800 Pure hypercholesterolemia, unspecified: Secondary | ICD-10-CM

## 2023-09-29 NOTE — Telephone Encounter (Signed)
Medication Refill -  Most Recent Primary Care Visit:  Provider: Bertram Denver W  Department: [facility]  Visit Type: MYCHART VIDEO VISIT  Date: 02/19/2023  Medication: simvastatin (ZOCOR) 40 MG tablet   Has the patient contacted their pharmacy? No  Is this the correct pharmacy for this prescription? No  This is the patient's preferred pharmacy: [facility] [address], [place] - [address] [place] [address] Phone: [phone] Fax: [phone]  Has the prescription been filled recently? Yes  Is the patient out of the medication? Yes  Has the patient been seen for an appointment in the last year OR does the patient have an upcoming appointment? Yes  Can we respond through MyChart? No  Agent: Please be advised that Rx refills may take up to 3 business days. We ask that you follow-up with your pharmacy.

## 2023-09-30 MED ORDER — SIMVASTATIN 40 MG PO TABS: 40.0000 mg | ORAL_TABLET | Freq: Every day | ORAL | 0 refills | Status: DC

## 2023-09-30 NOTE — Telephone Encounter (Signed)
Requested medication (s) are due for refill today: yes  Requested medication (s) are on the active medication list: yes    Last refill: 09/17/22  #90  1 refill  Future visit scheduled no  Notes to clinic:Failed due to labs, please review. Thank you.  Requested Prescriptions  Pending Prescriptions Disp Refills   simvastatin (ZOCOR) 40 MG tablet 90 tablet 1    Sig: Take 1 tablet (40 mg total) by mouth daily.     Cardiovascular:  Antilipid - Statins Failed - 09/29/2023  1:58 PM      Failed - Lipid Panel in normal range within the last 12 months    Cholesterol, Total  Date Value Ref Range Status  09/24/2023 207 (H) 100 - 199 mg/dL Final   LDL Chol Calc (NIH)  Date Value Ref Range Status  09/24/2023 142 (H) 0 - 99 mg/dL Final   HDL  Date Value Ref Range Status  09/24/2023 49 >39 mg/dL Final   Triglycerides  Date Value Ref Range Status  09/24/2023 87 0 - 149 mg/dL Final         Passed - Patient is not pregnant      Passed - Valid encounter within last 12 months    Recent Outpatient Visits           7 months ago Mild intermittent asthma without complication   [facility] [place] Claiborne Rigg, NP   8 months ago Encounter for [employer] annual wellness exam   [facility] [place] Claiborne Rigg, NP   10 months ago Encounter for Papanicolaou smear for cervical cancer screening   [facility] Of [place]. [facility] Claiborne Rigg, NP   11 months ago Encounter for medication review and counseling   [facility] [place]. [facility] Drucilla Chalet, RPH-CPP   1 year ago Encounter to establish care   [facility] Of [place]. [facility] Claiborne Rigg, NP       Future Appointments             In 1 month [facility], [place], [place]   HeartCare at [facility]

## 2023-10-04 DIAGNOSIS — J45998 Other asthma: Secondary | ICD-10-CM | POA: Diagnosis not present

## 2023-10-06 ENCOUNTER — Ambulatory Visit
Admission: RE | Admit: 2023-10-06 | Discharge: 2023-10-06 | Disposition: A | Discharge status: Home / Self Care | Payer: 59 | Source: Ambulatory Visit | Attending: Nurse Practitioner | Admitting: Nurse Practitioner

## 2023-10-06 ENCOUNTER — Ambulatory Visit (HOSPITAL_COMMUNITY)
Admission: EM | Admit: 2023-10-06 | Discharge: 2023-10-06 | Disposition: A | Discharge status: Home / Self Care | Payer: 59

## 2023-10-06 ENCOUNTER — Other Ambulatory Visit: Payer: Self-pay | Admitting: Nurse Practitioner

## 2023-10-06 ENCOUNTER — Encounter (HOSPITAL_COMMUNITY): Payer: Self-pay | Admitting: Emergency Medicine

## 2023-10-06 ENCOUNTER — Ambulatory Visit
Admission: RE | Admit: 2023-10-06 | Discharge: 2023-10-06 | Disposition: A | Discharge status: Home / Self Care | Payer: 59 | Source: Ambulatory Visit | Attending: Nurse Practitioner

## 2023-10-06 DIAGNOSIS — R928 Other abnormal and inconclusive findings on diagnostic imaging of breast: Secondary | ICD-10-CM

## 2023-10-06 DIAGNOSIS — N6310 Unspecified lump in the right breast, unspecified quadrant: Secondary | ICD-10-CM

## 2023-10-06 DIAGNOSIS — Z4802 Encounter for removal of sutures: Secondary | ICD-10-CM | POA: Diagnosis not present

## 2023-10-06 NOTE — ED Triage Notes (Signed)
Pt presents for suture removal from left hand that were placed in ED on 11/19

## 2023-10-06 NOTE — Discharge Instructions (Signed)
Can apply antibiotic ointment No signs of infection, healing well.

## 2023-10-06 NOTE — ED Provider Notes (Signed)
[facility]    CSN: [id number] Arrival date & time: 10/06/23  1534      History   Chief Complaint Chief Complaint  Patient presents with   Suture / Staple Removal    HPI Anyssia Evanson is a 57 y.o. female.   Patient presents for suture removal of left palm.  She reports she cut the hand with a knife on 1119, sutures were placed in the emergency department.  Patient denies redness, swelling, drainage.  She has been applying triple antibiotic ointment and keeping the area covered.  4 sutures in place.    Past Medical History:  Diagnosis Date   ADHD (attention deficit hyperactivity disorder)    Anxiety    Asthma    Bipolar disorder (HCC)    Blood transfusion without reported diagnosis 11/05/1983   after childbirth   Coronary artery disease    Depression    Diabetes mellitus without complication (HCC)    GERD (gastroesophageal reflux disease)    History of illicit drug use    Hyperlipidemia    Hypertension    Schizophrenia (HCC)    Sickle cell trait (HCC)    Tobacco use     Patient Active Problem List   Diagnosis Date Noted   Syncope 12/06/2018   CAD in native artery    Tobacco abuse 07/27/2018   Pure hypercholesterolemia    Generalized abdominal discomfort 12/10/2017   Effusion, right knee 03/13/2017   Sprain of right knee 03/13/2017   Chest pain 02/20/2015   Suprapubic pain 02/20/2015   Diarrhea 02/20/2015   Diabetes mellitus, type 2 (HCC) 02/20/2015   HTN (hypertension) 02/20/2015   Dyslipidemia 02/20/2015   Bipolar affective disorder (HCC) 02/20/2015   Schizophrenia (HCC) 02/20/2015   Hypokalemia 02/20/2015    Past Surgical History:  Procedure Laterality Date   CERVICAL BIOPSY  W/ LOOP ELECTRODE EXCISION     left eye surgery  1970   TUBAL LIGATION  1989    OB History     Gravida  7   Para      Term      Preterm      AB  3   Living  4      SAB  1   IAB  2   Ectopic      Multiple      Live Births                Home Medications    Prior to Admission medications   Medication Sig Start Date End Date Taking? Authorizing Provider  albuterol (PROVENTIL) (2.5 MG/3ML) 0.083% nebulizer solution Take 3 mLs (2.5 mg total) by nebulization every 6 (six) hours as needed for wheezing or shortness of breath. 02/19/23   Claiborne Rigg, NP  albuterol (VENTOLIN HFA) 108 (90 Base) MCG/ACT inhaler Inhale 2 puffs into the lungs every 6 (six) hours as needed for wheezing or shortness of breath. 07/15/23   Claiborne Rigg, NP  amLODipine (NORVASC) 2.5 MG tablet Take 1 tablet (2.5 mg total) by mouth daily. 09/25/23 12/24/23  Marcelino Duster, PA  aspirin EC 81 MG tablet Take 81 mg by mouth daily.    [provider]  benzonatate (TESSALON) 100 MG capsule Take 1 capsule (100 mg total) by mouth every 8 (eight) hours as needed for cough. 07/22/23   Wynonia Lawman A, NP  busPIRone (BUSPAR) 7.5 MG tablet Take 1 tablet (7.5 mg total) by mouth 2 (two) times daily. For anxiety 09/17/22  Claiborne Rigg, NP  cyclobenzaprine (FLEXERIL) 5 MG tablet Take 1 tablet (5 mg total) by mouth at bedtime. 05/15/23   Patel, Roxana Hires K, DO  dicyclomine (BENTYL) 20 MG tablet Take 1 tablet (20 mg total) by mouth 2 (two) times daily. Patient not taking: Reported on 09/25/2023 07/30/22   Smoot, Shawn Route, PA-C  fluticasone-salmeterol (ADVAIR HFA) 230-21 MCG/ACT inhaler Inhale 2 puffs into the lungs 2 (two) times daily. 03/14/23   Claiborne Rigg, NP  gabapentin (NEURONTIN) 300 MG capsule Take 1 capsule (300 mg total) by mouth at bedtime. 09/17/22   Claiborne Rigg, NP  glimepiride (AMARYL) 2 MG tablet Take 1 tablet by mouth once daily with breakfast 04/07/23   Claiborne Rigg, NP  glucose blood (ACCU-CHEK GUIDE) test strip Use as instructed. Check blood glucose by fingerstick twice per day. 09/17/22   Claiborne Rigg, NP  hydrocortisone (ANUSOL-HC) 2.5 % rectal cream Place 1 Application rectally 2 (two) times daily. 07/29/22   White,  Elita Boone, NP  ibuprofen (ADVIL) 400 MG tablet TAKE 1 TABLET BY MOUTH EVERY 6 HOURS AS NEEDED FOR MODERATE PAIN 07/08/23   Claiborne Rigg, NP  Lidocaine-Glycerin (PREPARATION H RE) Place 1 application  rectally as needed (hemmroids).    [provider]  lisinopril (ZESTRIL) 5 MG tablet Take 1 tablet (5 mg total) by mouth daily. 09/17/22   Claiborne Rigg, NP  loperamide (IMODIUM) 2 MG capsule Take 1 capsule (2 mg total) by mouth 4 (four) times daily as needed for diarrhea or loose stools. 07/22/23   Wynonia Lawman A, NP  meloxicam (MOBIC) 15 MG tablet Take 1 tablet (15 mg total) by mouth daily. 02/19/23   Claiborne Rigg, NP  MELOXICAM PO Take by mouth.    [provider]  metoprolol succinate (TOPROL-XL) 50 MG 24 hr tablet Take 1 tablet (50 mg total) by mouth daily. Take with or immediately following a meal. 09/17/22   Claiborne Rigg, NP  nitroGLYCERIN (NITROSTAT) 0.4 MG SL tablet Place 1 tablet (0.4 mg total) under the tongue every 5 (five) minutes as needed for chest pain. 09/24/23 12/23/23  Marcelino Duster, PA  pantoprazole (PROTONIX) 40 MG tablet Take 1 tablet (40 mg total) by mouth daily. 09/17/22   Claiborne Rigg, NP  PARoxetine (PAXIL) 20 MG tablet Take 1 tablet by mouth once daily 04/07/23   Claiborne Rigg, NP  simvastatin (ZOCOR) 40 MG tablet Take 1 tablet (40 mg total) by mouth daily. 09/30/23   Hoy Register, MD  sucralfate (CARAFATE) 1 g tablet Take 1 tablet (1 g total) by mouth 2 (two) times daily. 03/27/22   Meccariello, Solmon Ice, MD  SYMBICORT 160-4.5 MCG/ACT inhaler Inhale 2 puffs into the lungs daily.    [provider]  triamcinolone cream (KENALOG) 0.1 % Apply 1 Application topically 2 (two) times daily. 08/26/23   Carlisle Beers, FNP  valACYclovir (VALTREX) 1000 MG tablet TAKE 1 TABLET BY MOUTH THREE TIMES DAILY 04/07/23   Claiborne Rigg, NP    Family History Family History  Problem Relation Age of Onset   Hyperlipidemia Mother     Arthritis Mother    Parkinson's disease Mother    Heart attack Father    Diabetes Father    Hypertension Father    Hyperlipidemia Father    Cancer Sister        cervical   Breast cancer Sister 42   Breast cancer Sister    Colon cancer Maternal  Grandmother 27   Breast cancer Paternal Aunt    Kidney disease Other    Stroke Other    Pancreatic disease Other    Sickle cell anemia Other    Esophageal cancer Neg Hx    Rectal cancer Neg Hx     Social History Social History   Tobacco Use   Smoking status: Former    Current packs/day: 0.25    Average packs/day: 0.3 packs/day for 4.0 years (1.0 ttl pk-yrs)    Types: Cigarettes   Smokeless tobacco: Never  Vaping Use   Vaping status: Never Used  Substance Use Topics   Alcohol use: No    Alcohol/week: 0.0 standard drinks of alcohol   Drug use: No    Comment: no drug use for 7 years     Allergies   Sulfa antibiotics   Review of Systems Review of Systems  Constitutional:  Negative for chills and fever.  HENT:  Negative for ear pain and sore throat.   Eyes:  Negative for pain and visual disturbance.  Respiratory:  Negative for cough and shortness of breath.   Cardiovascular:  Negative for chest pain and palpitations.  Gastrointestinal:  Negative for abdominal pain and vomiting.  Genitourinary:  Negative for dysuria and hematuria.  Musculoskeletal:  Negative for arthralgias and back pain.  Skin:  Positive for wound. Negative for color change and rash.  Neurological:  Negative for seizures and syncope.  All other systems reviewed and are negative.    Physical Exam Triage Vital Signs ED Triage Vitals  Encounter Vitals Group     BP 10/06/23 1559 (!) 171/77     Systolic BP Percentile --      Diastolic BP Percentile --      Pulse Rate 10/06/23 1559 90     Resp 10/06/23 1559 18     Temp 10/06/23 1559 97.6 F (36.4 C)     Temp Source 10/06/23 1559 Oral     SpO2 10/06/23 1559 94 %     Weight --      Height --       Head Circumference --      Peak Flow --      Pain Score 10/06/23 1602 0     Pain Loc --      Pain Education --      Exclude from Growth Chart --    No data found.  Updated Vital Signs BP (!) 171/77 (BP Location: Left Arm)   Pulse 90   Temp 97.6 F (36.4 C) (Oral)   Resp 18   LMP 07/21/2013   SpO2 94%   Visual Acuity Right Eye Distance:   Left Eye Distance:   Bilateral Distance:    Right Eye Near:   Left Eye Near:    Bilateral Near:     Physical Exam Vitals and nursing note reviewed.  Constitutional:      General: She is not in acute distress.    Appearance: She is well-developed.  HENT:     Head: Normocephalic and atraumatic.  Eyes:     Conjunctiva/sclera: Conjunctivae normal.  Cardiovascular:     Rate and Rhythm: Normal rate and regular rhythm.     Heart sounds: No murmur heard. Pulmonary:     Effort: Pulmonary effort is normal. No respiratory distress.     Breath sounds: Normal breath sounds.  Abdominal:     Palpations: Abdomen is soft.     Tenderness: There is no abdominal tenderness.  Musculoskeletal:  General: No swelling.     Cervical back: Neck supple.  Skin:    General: Skin is warm and dry.     Capillary Refill: Capillary refill takes less than 2 seconds.       Neurological:     Mental Status: She is alert.  Psychiatric:        Mood and Affect: Mood normal.      UC Treatments / Results  Labs (all labs ordered are listed, but only abnormal results are displayed) Labs Reviewed - No data to display  EKG   Radiology No results found.  Procedures Procedures (including critical care time)  Medications Ordered in UC Medications - No data to display  Initial Impression / Assessment and Plan / UC Course  I have reviewed the triage vital signs and the nursing notes.  Pertinent labs & imaging results that were available during my care of the patient were reviewed by me and considered in my medical decision making (see chart for  details).     Wound well-healing, no dehiscence, no signs of infection.  For nylon sutures removed.  Aftercare discussed.  Return precautions discussed. Final Clinical Impressions(s) / UC Diagnoses   Final diagnoses:  Encounter for removal of sutures     Discharge Instructions      Can apply antibiotic ointment No signs of infection, healing well.     ED Prescriptions   None    PDMP not reviewed this encounter.   Ward, Tylene Fantasia, PA-C 10/06/23 1642

## 2023-10-07 ENCOUNTER — Telehealth: Payer: Self-pay | Admitting: Neurology

## 2023-10-07 ENCOUNTER — Telehealth: Payer: Self-pay

## 2023-10-07 NOTE — Telephone Encounter (Signed)
Patient was asked to call all of her providers as she needs a referral for a Stair lift for her home. She has medicare and medicaid

## 2023-10-07 NOTE — Telephone Encounter (Signed)
Copied from CRM [id number]. Topic: General - Other >> Oct 07, 2023 12:50 PM Dondra Prader E wrote: Reason for CRM: Pt called regarding her stairlift, says she needs documentation from her PCP.

## 2023-10-08 NOTE — Telephone Encounter (Signed)
Patient would like to know if this is a process we can help her with. Obtaining a stair lift.

## 2023-10-08 NOTE — Telephone Encounter (Signed)
Pt called no answer left a voice mail to call the office back  °

## 2023-10-09 NOTE — Telephone Encounter (Signed)
Pt is returning a call to [facility] she left a VM  please call her

## 2023-10-09 NOTE — Telephone Encounter (Signed)
Unable to reach patient by phone to relay response.  Voicemail left with detailed response.

## 2023-10-09 NOTE — Telephone Encounter (Signed)
Pt called an informed that Dr Allena Katz stated that Based on her last office visit, there is no neurological indication that she needs a chair lift. Pt stated what about her swelling and heart problems pt advised to call her heart dr and ask them for help with the lift

## 2023-10-21 ENCOUNTER — Encounter (HOSPITAL_COMMUNITY): Payer: Self-pay

## 2023-10-22 ENCOUNTER — Telehealth (HOSPITAL_COMMUNITY): Payer: Self-pay | Admitting: *Deleted

## 2023-10-22 NOTE — Telephone Encounter (Signed)
Attempted to call patient regarding upcoming cardiac PET appointment. Left message on voicemail with name and callback number Johney Frame RN Navigator Cardiac Imaging [facility] [phone] Office

## 2023-10-23 ENCOUNTER — Ambulatory Visit: Admission: RE | Admit: 2023-10-23 | Payer: 59 | Source: Ambulatory Visit

## 2023-10-27 ENCOUNTER — Other Ambulatory Visit: Payer: Self-pay | Admitting: Family Medicine

## 2023-10-27 DIAGNOSIS — E7800 Pure hypercholesterolemia, unspecified: Secondary | ICD-10-CM

## 2023-11-03 ENCOUNTER — Telehealth: Payer: Self-pay

## 2023-11-03 DIAGNOSIS — J45998 Other asthma: Secondary | ICD-10-CM | POA: Diagnosis not present

## 2023-11-03 NOTE — Telephone Encounter (Signed)
Patient identified by name and date of birth.  Patient states that she will find the facility she would like to go to and call us back with the information. The breast center at another location was offered  and patient adamantly refused to be seen there also.

## 2023-11-03 NOTE — Telephone Encounter (Signed)
Copied from CRM [id number]. Topic: Referral - Request for Referral >> Nov 03, 2023  9:58 AM Franchot Heidelberg wrote: Reason for CRM: Pt called and is very upset about her current mammogram location. She wants to have her Mammogram orders sent elsewhere.

## 2023-11-04 ENCOUNTER — Encounter: Payer: Self-pay | Admitting: Cardiovascular Disease

## 2023-11-04 ENCOUNTER — Telehealth (HOSPITAL_COMMUNITY): Payer: Self-pay | Admitting: *Deleted

## 2023-11-04 NOTE — Telephone Encounter (Signed)
 Attempted to call patient regarding upcoming cardiac PET appointment. Left message on voicemail with name and callback number Sid Seats RN Navigator Cardiac Imaging [facility] [phone] Office  Advised to avoid caffeine for 12 hours prior to PET scan.

## 2023-11-06 ENCOUNTER — Telehealth (HOSPITAL_COMMUNITY): Payer: Self-pay | Admitting: Emergency Medicine

## 2023-11-06 ENCOUNTER — Encounter (HOSPITAL_COMMUNITY): Payer: Self-pay | Admitting: Emergency Medicine

## 2023-11-06 ENCOUNTER — Ambulatory Visit: Admission: RE | Admit: 2023-11-06 | Payer: 59 | Source: Ambulatory Visit

## 2023-11-06 NOTE — Telephone Encounter (Signed)
 Pt reporting flu like symptoms and forgetting her appt for PET cardiac stress test today.   New appt made for 12/16/23 at 12:00p at [facility]   Will mail instruction letter to home address today.   Camie Shutter RN Navigator Cardiac Imaging [facility] [phone] Office  [phone] Cell

## 2023-11-12 ENCOUNTER — Other Ambulatory Visit: Payer: Self-pay | Admitting: Pharmacist

## 2023-11-12 NOTE — Progress Notes (Signed)
 Pharmacy Quality Measure Review  This patient is appearing on report for being at risk of failing the measure for Statin Use in Persons with Diabetes (SUPD) medications this calendar year.   Patient has simvastatin  on her profile but only filled once in 2024. At risk for continued SUPD failure but has not been seen since 05/2023. Message sent to admin to try and get her scheduled so she can get refills.   Herlene Fleeta Morris, PharmD, JAQUELINE, CPP Clinical Pharmacist [facility] [phone]

## 2023-11-24 ENCOUNTER — Encounter (HOSPITAL_COMMUNITY): Payer: Self-pay | Admitting: Emergency Medicine

## 2023-11-26 ENCOUNTER — Ambulatory Visit: Payer: 59 | Admitting: Physician Assistant

## 2023-12-04 DIAGNOSIS — J45998 Other asthma: Secondary | ICD-10-CM | POA: Diagnosis not present

## 2023-12-15 ENCOUNTER — Telehealth (HOSPITAL_COMMUNITY): Payer: Self-pay | Admitting: *Deleted

## 2023-12-15 ENCOUNTER — Ambulatory Visit: Payer: Self-pay | Admitting: Nurse Practitioner

## 2023-12-15 NOTE — Telephone Encounter (Signed)
 Chief Complaint: flu-like symptoms Symptoms: chills, muscle aches, chest pain, black stools, menopausal symptoms Frequency: chills since November, black bowel movements last week Pertinent Negatives: Patient denies fever Disposition: [] ED /[] Urgent Care (no appt availability in office) / [x] Appointment(In office/virtual)/ []  [facility]/ [] Home Care/ [x] Refused Recommended Disposition /[] [facility]/ []  Follow-up with PCP Additional Notes: Pt calls with multiple symptoms today. RN had a difficult time understanding all of the patient's symptoms and the timelines for each. While on the phone, pt exhibited flight of ideas and jumped to and from different topics and had a difficult time giving a direct answer to RN's questions.  First, patient reported chills and what she thinks are menopausal symptoms. Pt very frustrated by menopausal symptoms, states they are not resolving. Reports chills since November with extreme hot flashes and some body aches. States she is "getting over the flu" in combination with menopause.  Then, patient reported having black bowel movements last week. Last black bowel movement last Wednesday, none since. Pt had diarrhea at the same time. Hx of hemorrhoids. No bright red blood present. No bleeding now. No dizziness or weakness. Per protocol, RN advised pt she needs to be seen in the next 3 days. No availability in that time frame at pt's PCP office. RN stated she would like to schedule pt at a different office, pt declined and stated she only wanted to go  to her PCP office or urgent care.   Then, patient mentioned an episode of chest pain with diaphoresis in which she took 2 nitroglycerin . RN asked patient multiple times when exactly this occurred and patient did not give a straight answer. Pt states she felt like she was going to die at that time. At that point, RN advised patient she needs to see a HCP in the next 24 hours. Pt again declined an appt with  any outside office. Eventually patient verbalized that if she has CP again or loses a large amount of blood/has dark stools again, she will call 911. Pt declined again an office visit at a different office and said she will go to Urgent Care if she feels that is necessary. RN advised she would relay to [place] pt's symptoms. Pt verbalized understanding.   Reason for Disposition  [1] Chest pain lasts > 5 minutes AND [2] occurred > 3 days ago (72 hours) AND [3] NO chest pain or cardiac symptoms now  Additional Information  Commented on: MILD rectal bleeding (more than just a few drops or streaks)    No black bowel movements since last Wednesday 2/5  Answer Assessment - Initial Assessment Questions 1. ONSET: "When did the muscle aches or body pains start?"      Body aches and body chills. "I started that in November. I get so cold with a gown and blanket, have to put on two comforters." -- thinks this is menopause. States she's had flu-like symptoms. 2. LOCATION: "What part of your body is hurting?" (e.g., entire body, arms, legs)      "Top of the stomach, under the breasts was hurting." Abdominal cramps have resolved. 3. SEVERITY: "How bad is the pain?" (Scale 1-10; or mild, moderate, severe)   - MILD (1-3): doesn't interfere with normal activities    - MODERATE (4-7): interferes with normal activities or awakens from sleep    - SEVERE (8-10):  excruciating pain, unable to do any normal activities      "Aching like I have the flu" - pt wasn't able  to provide RN a pain score 4. CAUSE: "What do you think is causing the pains?"     Thinks she has the flu 5. FEVER: "Have you been having fever?"     Pt thought she had a temperature d/t chills but it was 98.41F 6. OTHER SYMPTOMS: "Do you have any other symptoms?" (e.g., chest pain, weakness, rash, cold or flu symptoms, weight loss)     Body aches, chills, cough, runny nose, diarrhea and abdominal cramps that are resolving. Pt states she had a black  bowel movement with abdominal cramping and that she alerted her aunt who is a retired Charity fundraiser. Aunt let patient know black bowel movements are a sign of a GI bleed, which made patient nervous. Pt states her bowel movements are a "regular color" now. Pt states last time she had a "black" bowel movement was last Wednesday. Stopped taking Pepto Bismol. "I'm in post-menopause", hot flashes  Answer Assessment - Initial Assessment Questions 1. APPEARANCE of BLOOD: "What color is it?" "Is it passed separately, on the surface of the stool, or mixed in with the stool?"      Stool is a regular color now, was black/dark. 2 days of black bowel movements. None since Wednesday. No bright red blood. 2. AMOUNT: "How much blood was passed?"      No bright red blood, pt did have 2 days of diarrhea 3. FREQUENCY: "How many times has blood been passed with the stools?"      2 days of dark/black diarrhea 4. ONSET: "When was the blood first seen in the stools?" (Days or weeks)      Last black bowel movement last Wednesday 5. DIARRHEA: "Is there also some diarrhea?" If Yes, ask: "How many diarrhea stools in the past 24 hours?"      No, but to "regular" 6. CONSTIPATION: "Do you have constipation?" If Yes, ask: "How bad is it?"     No 7. RECURRENT SYMPTOMS: "Have you had blood in your stools before?" If Yes, ask: "When was the last time?" and "What happened that time?"      Hx of hemorrhoids, "it's been a minute" 8. BLOOD THINNERS: "Do you take any blood thinners?" (e.g., Coumadin/warfarin, Pradaxa/dabigatran, aspirin )     No 9. OTHER SYMPTOMS: "Do you have any other symptoms?"  (e.g., abdomen pain, vomiting, dizziness, fever)     Hx of hemorrhoids, abdominal cramping that has resolved, menopausal symptoms like chills/hot flashes  Answer Assessment - Initial Assessment Questions 1. LOCATION: "Where does it hurt?"       Unable to give a certain location 2. RADIATION: "Does the pain go anywhere else?" (e.g., into neck, jaw,  arms, back)     "My neck and under my arm was contracting, I was hurting so bad I thought I was going to die, I took nitroglycerin  and my head didn't even hurt" 3. ONSET: "When did the chest pain begin?" (Minutes, hours or days)      Pt states it was recently but does not remember when. RN tried multiple times to get patient to state when exactly this happened 4. PATTERN: "Does the pain come and go, or has it been constant since it started?"  "Does it get worse with exertion?"      Come and go 5. DURATION: "How long does it last" (e.g., seconds, minutes, hours)     "I don't even know" 6. SEVERITY: "How bad is the pain?"  (e.g., Scale 1-10; mild, moderate, or severe)    -  MILD (1-3): doesn't interfere with normal activities     - MODERATE (4-7): interferes with normal activities or awakens from sleep    - SEVERE (8-10): excruciating pain, unable to do any normal activities       "It would have been a 20" 7. CARDIAC RISK FACTORS: "Do you have any history of heart problems or risk factors for heart disease?" (e.g., angina, prior heart attack; diabetes, high blood pressure, high cholesterol, smoker, or strong family history of heart disease)     CAD 8. PULMONARY RISK FACTORS: "Do you have any history of lung disease?"  (e.g., blood clots in lung, asthma, emphysema, birth control pills)     No 9. CAUSE: "What do you think is causing the chest pain?"     "Thought I was having a heart attack" 10. OTHER SYMPTOMS: "Do you have any other symptoms?" (e.g., dizziness, nausea, vomiting, sweating, fever, difficulty breathing, cough)       Sweating "so hard" and scared to move, "felt like I couldn't breathe", "felt like the wind was knocked out of me"  Protocols used: Muscle Aches and Body Pain-A-AH, Rectal Bleeding-A-AH, Chest Pain-A-AH

## 2023-12-15 NOTE — Telephone Encounter (Signed)
 2nd attempt; left voicemail for patient to return call for triage.  Copied From CRM [id number]. Reason for Triage: Pt called in and has flu like symptoms. Pt says getting hot flashes and is not feeling well. Would like to know if should go to urgent care or schedule appt  requesting callback [phone]

## 2023-12-15 NOTE — Telephone Encounter (Signed)
 Attempted to call patient regarding upcoming cardiac PET appointment. Left message on voicemail with name and callback number Johney Frame RN Navigator Cardiac Imaging [facility] [phone] Office  Advised to avoid caffeine for 12 hours prior to test.

## 2023-12-15 NOTE — Telephone Encounter (Signed)
 This RN made first attempt to triage. No answer, left a message. Will continue to attempt.  Copied From CRM [id number]. Reason for Triage: Pt called in and has flu like symptoms. Pt says getting hot flashes and is not feeling well. Would like to know if should go to urgent care or schedule appt  requesting callback [phone]

## 2023-12-16 ENCOUNTER — Ambulatory Visit (HOSPITAL_COMMUNITY): Payer: 59

## 2024-01-03 ENCOUNTER — Encounter (HOSPITAL_COMMUNITY): Payer: Self-pay

## 2024-01-03 ENCOUNTER — Emergency Department (HOSPITAL_COMMUNITY)

## 2024-01-03 ENCOUNTER — Emergency Department (HOSPITAL_COMMUNITY)
Admission: EM | Admit: 2024-01-03 | Discharge: 2024-01-04 | Disposition: A | Discharge status: Home / Self Care | Attending: Emergency Medicine | Admitting: Emergency Medicine

## 2024-01-03 DIAGNOSIS — J069 Acute upper respiratory infection, unspecified: Secondary | ICD-10-CM | POA: Insufficient documentation

## 2024-01-03 DIAGNOSIS — R197 Diarrhea, unspecified: Secondary | ICD-10-CM | POA: Diagnosis present

## 2024-01-03 DIAGNOSIS — I700 Atherosclerosis of aorta: Secondary | ICD-10-CM | POA: Diagnosis not present

## 2024-01-03 DIAGNOSIS — Z79899 Other long term (current) drug therapy: Secondary | ICD-10-CM | POA: Insufficient documentation

## 2024-01-03 DIAGNOSIS — E860 Dehydration: Secondary | ICD-10-CM | POA: Diagnosis not present

## 2024-01-03 DIAGNOSIS — J432 Centrilobular emphysema: Secondary | ICD-10-CM | POA: Diagnosis not present

## 2024-01-03 DIAGNOSIS — K5730 Diverticulosis of large intestine without perforation or abscess without bleeding: Secondary | ICD-10-CM | POA: Diagnosis not present

## 2024-01-03 DIAGNOSIS — Z7982 Long term (current) use of aspirin: Secondary | ICD-10-CM | POA: Diagnosis not present

## 2024-01-03 DIAGNOSIS — R079 Chest pain, unspecified: Secondary | ICD-10-CM | POA: Diagnosis not present

## 2024-01-03 DIAGNOSIS — Z743 Need for continuous supervision: Secondary | ICD-10-CM | POA: Diagnosis not present

## 2024-01-03 DIAGNOSIS — R0600 Dyspnea, unspecified: Secondary | ICD-10-CM | POA: Diagnosis not present

## 2024-01-03 DIAGNOSIS — R6889 Other general symptoms and signs: Secondary | ICD-10-CM | POA: Diagnosis not present

## 2024-01-03 LAB — POC OCCULT BLOOD, ED: Fecal Occult Bld: NEGATIVE

## 2024-01-03 NOTE — ED Triage Notes (Signed)
 Patient is from home. Called EMS for 2 days of dark stool. Patient states she has had this before and was instructed by her GI Dr to call 911 if it occurred again. Patient also reports feeling weak and having cough. States she thinks she has pneumonia. Also reports epigastric pain that radiates to her back that is worse with exertion.

## 2024-01-03 NOTE — ED Provider Notes (Signed)
 [place] EMERGENCY DEPARTMENT AT [facility] Provider Note   CSN: [id number] Arrival date & time: 01/03/24  2324     History {Add pertinent medical, surgical, social history, OB history to HPI:1} Chief Complaint  Patient presents with   Diarrhea    Melissa Pham is a 58 y.o. female.  Patient presents to the emergency department with multiple complaints.  Patient reports that she has been having diarrhea for the last 2 days.  Stools have been intermittently dark.  No rectal bleeding.  Patient reports that she has had persistent pain in the upper central abdomen that goes through into her back.  No vomiting.  Patient also reports that she has chest congestion, cough that has been ongoing for a few days.       Home Medications Prior to Admission medications   Medication Sig Start Date End Date Taking? Authorizing Provider  albuterol (PROVENTIL) (2.5 MG/3ML) 0.083% nebulizer solution Take 3 mLs (2.5 mg total) by nebulization every 6 (six) hours as needed for wheezing or shortness of breath. 02/19/23   Claiborne Rigg, NP  albuterol (VENTOLIN HFA) 108 (90 Base) MCG/ACT inhaler Inhale 2 puffs into the lungs every 6 (six) hours as needed for wheezing or shortness of breath. 07/15/23   Claiborne Rigg, NP  amLODipine (NORVASC) 2.5 MG tablet Take 1 tablet (2.5 mg total) by mouth daily. 09/25/23 12/24/23  Marcelino Duster, PA  aspirin EC 81 MG tablet Take 81 mg by mouth daily.    [provider]  benzonatate (TESSALON) 100 MG capsule Take 1 capsule (100 mg total) by mouth every 8 (eight) hours as needed for cough. 07/22/23   Wynonia Lawman A, NP  busPIRone (BUSPAR) 7.5 MG tablet Take 1 tablet (7.5 mg total) by mouth 2 (two) times daily. For anxiety 09/17/22   Claiborne Rigg, NP  cyclobenzaprine (FLEXERIL) 5 MG tablet Take 1 tablet (5 mg total) by mouth at bedtime. 05/15/23   Patel, Roxana Hires K, DO  dicyclomine (BENTYL) 20 MG tablet Take 1 tablet (20 mg total) by mouth 2  (two) times daily. Patient not taking: Reported on 09/25/2023 07/30/22   Smoot, Shawn Route, PA-C  fluticasone-salmeterol (ADVAIR HFA) 230-21 MCG/ACT inhaler Inhale 2 puffs into the lungs 2 (two) times daily. 03/14/23   Claiborne Rigg, NP  gabapentin (NEURONTIN) 300 MG capsule Take 1 capsule (300 mg total) by mouth at bedtime. 09/17/22   Claiborne Rigg, NP  glimepiride (AMARYL) 2 MG tablet Take 1 tablet by mouth once daily with breakfast 04/07/23   Claiborne Rigg, NP  glucose blood (ACCU-CHEK GUIDE) test strip Use as instructed. Check blood glucose by fingerstick twice per day. 09/17/22   Claiborne Rigg, NP  hydrocortisone (ANUSOL-HC) 2.5 % rectal cream Place 1 Application rectally 2 (two) times daily. 07/29/22   White, Elita Boone, NP  ibuprofen (ADVIL) 400 MG tablet TAKE 1 TABLET BY MOUTH EVERY 6 HOURS AS NEEDED FOR MODERATE PAIN 07/08/23   Claiborne Rigg, NP  Lidocaine-Glycerin (PREPARATION H RE) Place 1 application  rectally as needed (hemmroids).    [provider]  lisinopril (ZESTRIL) 5 MG tablet Take 1 tablet (5 mg total) by mouth daily. 09/17/22   Claiborne Rigg, NP  loperamide (IMODIUM) 2 MG capsule Take 1 capsule (2 mg total) by mouth 4 (four) times daily as needed for diarrhea or loose stools. 07/22/23   Wynonia Lawman A, NP  meloxicam (MOBIC) 15 MG tablet Take 1 tablet (15 mg total)  by mouth daily. 02/19/23   Claiborne Rigg, NP  MELOXICAM PO Take by mouth.    [provider]  metoprolol succinate (TOPROL-XL) 50 MG 24 hr tablet Take 1 tablet (50 mg total) by mouth daily. Take with or immediately following a meal. 09/17/22   Claiborne Rigg, NP  nitroGLYCERIN (NITROSTAT) 0.4 MG SL tablet Place 1 tablet (0.4 mg total) under the tongue every 5 (five) minutes as needed for chest pain. 09/24/23 12/23/23  Marcelino Duster, PA  pantoprazole (PROTONIX) 40 MG tablet Take 1 tablet (40 mg total) by mouth daily. 09/17/22   Claiborne Rigg, NP  PARoxetine (PAXIL) 20 MG tablet  Take 1 tablet by mouth once daily 04/07/23   Claiborne Rigg, NP  simvastatin (ZOCOR) 40 MG tablet Take 1 tablet (40 mg total) by mouth daily. 09/30/23   Hoy Register, MD  sucralfate (CARAFATE) 1 g tablet Take 1 tablet (1 g total) by mouth 2 (two) times daily. 03/27/22   Meccariello, Solmon Ice, MD  SYMBICORT 160-4.5 MCG/ACT inhaler Inhale 2 puffs into the lungs daily.    [provider]  triamcinolone cream (KENALOG) 0.1 % Apply 1 Application topically 2 (two) times daily. 08/26/23   Carlisle Beers, FNP  valACYclovir (VALTREX) 1000 MG tablet TAKE 1 TABLET BY MOUTH THREE TIMES DAILY 04/07/23   Claiborne Rigg, NP      Allergies    Sulfa antibiotics    Review of Systems   Review of Systems  Physical Exam Updated Vital Signs BP (!) 130/104 (BP Location: Left Arm)   Pulse 78   Temp (!) 97.5 F (36.4 C) (Oral)   Resp (!) 28   LMP 07/21/2013   SpO2 100%  Physical Exam Vitals and nursing note reviewed.  Constitutional:      General: She is not in acute distress.    Appearance: She is well-developed.  HENT:     Head: Normocephalic and atraumatic.     Mouth/Throat:     Mouth: Mucous membranes are moist.  Eyes:     General: Vision grossly intact. Gaze aligned appropriately.     Extraocular Movements: Extraocular movements intact.     Conjunctiva/sclera: Conjunctivae normal.  Cardiovascular:     Rate and Rhythm: Normal rate and regular rhythm.     Pulses: Normal pulses.     Heart sounds: Normal heart sounds, S1 normal and S2 normal. No murmur heard.    No friction rub. No gallop.  Pulmonary:     Effort: Pulmonary effort is normal. No respiratory distress.     Breath sounds: Normal breath sounds.  Abdominal:     General: Bowel sounds are normal.     Palpations: Abdomen is soft.     Tenderness: There is abdominal tenderness in the epigastric area. There is no guarding or rebound.     Hernia: No hernia is present.  Musculoskeletal:        General: No swelling.      Cervical back: Full passive range of motion without pain, normal range of motion and neck supple. No spinous process tenderness or muscular tenderness. Normal range of motion.     Right lower leg: No edema.     Left lower leg: No edema.  Skin:    General: Skin is warm and dry.     Capillary Refill: Capillary refill takes less than 2 seconds.     Findings: No ecchymosis, erythema, rash or wound.  Neurological:     General: No focal  deficit present.     Mental Status: She is alert and oriented to person, place, and time.     GCS: GCS eye subscore is 4. GCS verbal subscore is 5. GCS motor subscore is 6.     Cranial Nerves: Cranial nerves 2-12 are intact.     Sensory: Sensation is intact.     Motor: Motor function is intact.     Coordination: Coordination is intact.  Psychiatric:        Attention and Perception: Attention normal.        Mood and Affect: Mood normal.        Speech: Speech normal.        Behavior: Behavior normal.     ED Results / Procedures / Treatments   Labs (all labs ordered are listed, but only abnormal results are displayed) Labs Reviewed  RESP PANEL BY RT-PCR (RSV, FLU A&B, COVID)  RVPGX2  CBC WITH DIFFERENTIAL/PLATELET  COMPREHENSIVE METABOLIC PANEL  LIPASE, BLOOD  POC OCCULT BLOOD, ED  TROPONIN I (HIGH SENSITIVITY)    EKG None  Radiology No results found.  Procedures Procedures  {Document cardiac monitor, telemetry assessment procedure when appropriate:1}  Medications Ordered in ED Medications - No data to display  ED Course/ Medical Decision Making/ A&P   {   Click here for ABCD2, HEART and other calculatorsREFRESH Note before signing :1}                              Medical Decision Making Amount and/or Complexity of Data Reviewed Labs: ordered. Radiology: ordered.   ***  {Document critical care time when appropriate:1} {Document review of labs and clinical decision tools ie heart score, Chads2Vasc2 etc:1}  {Document your  independent review of radiology images, and any outside records:1} {Document your discussion with family members, caretakers, and with consultants:1} {Document social determinants of health affecting pt's care:1} {Document your decision making why or why not admission, treatments were needed:1} Final Clinical Impression(s) / ED Diagnoses Final diagnoses:  None    Rx / DC Orders ED Discharge Orders     None

## 2024-01-04 ENCOUNTER — Emergency Department (HOSPITAL_COMMUNITY)

## 2024-01-04 DIAGNOSIS — J432 Centrilobular emphysema: Secondary | ICD-10-CM | POA: Diagnosis not present

## 2024-01-04 DIAGNOSIS — E860 Dehydration: Secondary | ICD-10-CM | POA: Diagnosis not present

## 2024-01-04 DIAGNOSIS — K5730 Diverticulosis of large intestine without perforation or abscess without bleeding: Secondary | ICD-10-CM | POA: Diagnosis not present

## 2024-01-04 DIAGNOSIS — I700 Atherosclerosis of aorta: Secondary | ICD-10-CM | POA: Diagnosis not present

## 2024-01-04 LAB — RESP PANEL BY RT-PCR (RSV, FLU A&B, COVID)  RVPGX2
Influenza A by PCR: NEGATIVE
Influenza B by PCR: NEGATIVE
Resp Syncytial Virus by PCR: NEGATIVE
SARS Coronavirus 2 by RT PCR: NEGATIVE

## 2024-01-04 LAB — LIPASE, BLOOD: Lipase: 27 U/L (ref 11–51)

## 2024-01-04 LAB — CBC WITH DIFFERENTIAL/PLATELET
Abs Immature Granulocytes: 0.02 10*3/uL (ref 0.00–0.07)
Basophils Absolute: 0 10*3/uL (ref 0.0–0.1)
Basophils Relative: 0 %
Eosinophils Absolute: 0.2 10*3/uL (ref 0.0–0.5)
Eosinophils Relative: 3 %
HCT: 38 % (ref 36.0–46.0)
Hemoglobin: 12.8 g/dL (ref 12.0–15.0)
Immature Granulocytes: 0 %
Lymphocytes Relative: 37 %
Lymphs Abs: 2.5 10*3/uL (ref 0.7–4.0)
MCH: 28.9 pg (ref 26.0–34.0)
MCHC: 33.7 g/dL (ref 30.0–36.0)
MCV: 85.8 fL (ref 80.0–100.0)
Monocytes Absolute: 0.5 10*3/uL (ref 0.1–1.0)
Monocytes Relative: 8 %
Neutro Abs: 3.4 10*3/uL (ref 1.7–7.7)
Neutrophils Relative %: 52 %
Platelets: 308 10*3/uL (ref 150–400)
RBC: 4.43 MIL/uL (ref 3.87–5.11)
RDW: 14.1 % (ref 11.5–15.5)
WBC: 6.6 10*3/uL (ref 4.0–10.5)
nRBC: 0 % (ref 0.0–0.2)

## 2024-01-04 LAB — COMPREHENSIVE METABOLIC PANEL
ALT: 13 U/L (ref 0–44)
AST: 31 U/L (ref 15–41)
Albumin: 3.3 g/dL — ABNORMAL LOW (ref 3.5–5.0)
Alkaline Phosphatase: 62 U/L (ref 38–126)
Anion gap: 11 (ref 5–15)
BUN: 17 mg/dL (ref 6–20)
CO2: 23 mmol/L (ref 22–32)
Calcium: 9.1 mg/dL (ref 8.9–10.3)
Chloride: 107 mmol/L (ref 98–111)
Creatinine, Ser: 0.82 mg/dL (ref 0.44–1.00)
GFR, Estimated: >60 mL/min (ref >60)
Glucose, Bld: 115 mg/dL — ABNORMAL HIGH (ref 70–99)
Potassium: 3.8 mmol/L (ref 3.5–5.1)
Sodium: 141 mmol/L (ref 135–145)
Total Bilirubin: 1 mg/dL (ref 0.0–1.2)
Total Protein: 6.6 g/dL (ref 6.5–8.1)

## 2024-01-04 LAB — TROPONIN I (HIGH SENSITIVITY)
Troponin I (High Sensitivity): 4 ng/L (ref <18)
Troponin I (High Sensitivity): 4 ng/L (ref <18)

## 2024-01-04 MED ORDER — ONDANSETRON HCL 4 MG/2ML IJ SOLN
4.0000 mg | Freq: Once | INTRAMUSCULAR | Status: AC
  Administered 2024-01-04: 4 mg via INTRAVENOUS
  Filled 2024-01-04: qty 2

## 2024-01-04 MED ORDER — IOHEXOL 350 MG/ML SOLN
75.0000 mL | Freq: Once | INTRAVENOUS | Status: AC | PRN
  Administered 2024-01-04: 75 mL via INTRAVENOUS

## 2024-01-04 MED ORDER — SUCRALFATE 1 G PO TABS: 1.0000 g | ORAL_TABLET | Freq: Three times a day (TID) | ORAL | 0 refills | Status: AC

## 2024-01-04 MED ORDER — PANTOPRAZOLE SODIUM 40 MG PO TBEC: 40.0000 mg | DELAYED_RELEASE_TABLET | Freq: Every day | ORAL | 3 refills | Status: AC

## 2024-01-04 MED ORDER — MORPHINE SULFATE (PF) 4 MG/ML IV SOLN
4.0000 mg | Freq: Once | INTRAVENOUS | Status: AC
  Administered 2024-01-04: 4 mg via INTRAVENOUS
  Filled 2024-01-04 (×2): qty 1

## 2024-01-04 NOTE — Discharge Instructions (Signed)
 Make sure you are to aching the Protonix (pantoprazole).  It looks like you were prescribed this in the past but I have sent a refill in case you do not have it.  I have also ordered Carafate which can help your stomach heal if it is inflamed.

## 2024-01-04 NOTE — ED Notes (Signed)
 This RN reviewed discharge instructions with patient. She verbalized understanding and denied any further questions. PT well appearing upon discharge and reports tolerable pain. Pt ambulated with stable gait to exit. Pt endorses ride home with family

## 2024-01-04 NOTE — ED Notes (Signed)
Pt requesting pain medication.  MD notified.

## 2024-01-07 ENCOUNTER — Ambulatory Visit: Payer: 59 | Attending: Nurse Practitioner | Admitting: Nurse Practitioner

## 2024-01-07 VITALS — BP 150/97 | HR 79 | Wt 189.0 lb

## 2024-01-07 DIAGNOSIS — L723 Sebaceous cyst: Secondary | ICD-10-CM

## 2024-01-07 DIAGNOSIS — J4520 Mild intermittent asthma, uncomplicated: Secondary | ICD-10-CM | POA: Diagnosis not present

## 2024-01-07 DIAGNOSIS — Z9181 History of falling: Secondary | ICD-10-CM

## 2024-01-07 DIAGNOSIS — I10 Essential (primary) hypertension: Secondary | ICD-10-CM

## 2024-01-07 DIAGNOSIS — Z794 Long term (current) use of insulin: Secondary | ICD-10-CM

## 2024-01-07 DIAGNOSIS — R531 Weakness: Secondary | ICD-10-CM

## 2024-01-07 DIAGNOSIS — E119 Type 2 diabetes mellitus without complications: Secondary | ICD-10-CM

## 2024-01-07 DIAGNOSIS — E1142 Type 2 diabetes mellitus with diabetic polyneuropathy: Secondary | ICD-10-CM | POA: Diagnosis not present

## 2024-01-07 DIAGNOSIS — E7800 Pure hypercholesterolemia, unspecified: Secondary | ICD-10-CM

## 2024-01-07 DIAGNOSIS — F200 Paranoid schizophrenia: Secondary | ICD-10-CM

## 2024-01-07 MED ORDER — SIMVASTATIN 40 MG PO TABS: 40.0000 mg | ORAL_TABLET | Freq: Every day | ORAL | 1 refills | Status: DC

## 2024-01-07 MED ORDER — ALBUTEROL SULFATE HFA 108 (90 BASE) MCG/ACT IN AERS: 2.0000 | INHALATION_SPRAY | Freq: Four times a day (QID) | RESPIRATORY_TRACT | 0 refills | Status: DC | PRN

## 2024-01-07 MED ORDER — LISINOPRIL 20 MG PO TABS: 20.0000 mg | ORAL_TABLET | Freq: Every day | ORAL | 1 refills | Status: DC

## 2024-01-07 MED ORDER — FLUTICASONE-SALMETEROL 230-21 MCG/ACT IN AERO: 2.0000 | INHALATION_SPRAY | Freq: Two times a day (BID) | RESPIRATORY_TRACT | 12 refills | Status: DC

## 2024-01-07 MED ORDER — GLIMEPIRIDE 2 MG PO TABS: 2.0000 mg | ORAL_TABLET | Freq: Every day | ORAL | 0 refills | Status: DC

## 2024-01-07 MED ORDER — GABAPENTIN 300 MG PO CAPS: 300.0000 mg | ORAL_CAPSULE | Freq: Every day | ORAL | 1 refills | Status: AC

## 2024-01-07 NOTE — Progress Notes (Signed)
 .  Assessment & Plan:  Melissa Pham was seen today for hospitalization follow-up.  Diagnoses and all orders for this visit:  Primary hypertension Increase lisinopril from 5mg  to 20mg  Follow up 4 weeks for BP recheck -     lisinopril (ZESTRIL) 20 MG tablet; Take 1 tablet (20 mg total) by mouth daily. Blood pressure 150/97 mmHg Has not take medication in past 3 months Take medications as prescribed  Type 2 diabetes mellitus without complication, without long-term current use of insulin (HCC) -     glimepiride (AMARYL) 2 MG tablet; Take 1 tablet (2 mg total) by mouth daily with breakfast. -     Hemoglobin A1c Schedule lab visit to check Hemoglobin A1C. She declines bloodwork today.  Mild intermittent asthma without complication Asthma well controlled -     fluticasone-salmeterol (ADVAIR HFA) 230-21 MCG/ACT inhaler; Inhale 2 puffs into the lungs 2 (two) times daily. -     albuterol (VENTOLIN HFA) 108 (90 Base) MCG/ACT inhaler; Inhale 2 puffs into the lungs every 6 (six) hours as needed for wheezing or shortness of breath. Medications refilled   Diabetic polyneuropathy associated with type 2 diabetes mellitus (HCC) -     gabapentin (NEURONTIN) 300 MG capsule; Take 1 capsule (300 mg total) by mouth at bedtime.  Pure hypercholesterolemia -     simvastatin (ZOCOR) 40 MG tablet; Take 1 tablet (40 mg total) by mouth daily. INSTRUCTIONS: Work on a low fat, heart healthy diet and participate in regular aerobic exercise program by working out at least 150 minutes per week; 5 days a week-30 minutes per day. Avoid red meat/beef/steak,  fried foods. junk foods, sodas, sugary drinks, unhealthy snacking, alcohol and smoking.  Drink at least 80 oz of water per day and monitor your carbohydrate intake daily.    Weakness -     Ambulatory referral to Physical Therapy Using Cane for ambulation  Ambulatory Referral to Dermatology   sebaceous cyst on upper back   At high risk for injury related to fall -      Ambulatory referral to Physical Therapy Fall 2 weeks ago with intermittent back pain Using cane for ambulation  Patient has been counseled on age-appropriate routine health concerns for screening and prevention. These are reviewed and up-to-date. Referrals have been placed accordingly. Immunizations are up-to-date or declined.    Subjective:   Chief Complaint  Patient presents with   Hospitalization Follow-up    Medication refills as well     Melissa Pham 58 y.o. female presents to office today for routine follow up on HTN and medication refill. States she has been out of her medications for the past 3 months. She was recently seen in emergency room for diarrhea which has resolved. Reports she experiences anxiety and mood swings due to Bipolar Disorder but does not desire to take any medications or see a psychiatrist for this. States she had a fall 2 weeks ago. She is using a walking cane here in the office today. Notes experiencing intermittent pain in her right hip and lower back area.  She is not taking any medications for pain.  She is concerned about a small "knot" on her upper back. It has been there for several months. No pain with palpation. No signs of skin infection/cellulitis. She would however like for it to be excised.    HTN She does not know what medications she is currently taking. Describes them by shape. She does not have them here with her today for review. Blood pressure  is elevated. BP Readings from Last 3 Encounters:  01/07/24 (!) 150/97  01/04/24 (!) 110/98  10/06/23 (!) 171/77    Review of Systems  Constitutional: Negative.  Negative for chills and fever.       Hot Flashes  HENT: Negative.    Eyes: Negative.   Respiratory: Negative.    Cardiovascular: Negative.  Negative for chest pain and palpitations.  Gastrointestinal: Negative.  Negative for abdominal pain, diarrhea, nausea and vomiting.  Genitourinary: Negative.   Musculoskeletal:  Positive for  back pain and falls.  Skin:        SEE HPI  Neurological:  Positive for weakness. Negative for dizziness.  Endo/Heme/Allergies: Negative.   Psychiatric/Behavioral:  Negative for depression. The patient is nervous/anxious.     Past Medical History:  Diagnosis Date   ADHD (attention deficit hyperactivity disorder)    Anxiety    Asthma    Bipolar disorder (HCC)    Blood transfusion without reported diagnosis 11/05/1983   after childbirth   Coronary artery disease    Depression    Diabetes mellitus without complication (HCC)    GERD (gastroesophageal reflux disease)    History of illicit drug use    Hyperlipidemia    Hypertension    Schizophrenia (HCC)    Sickle cell trait (HCC)    Tobacco use     Past Surgical History:  Procedure Laterality Date   CERVICAL BIOPSY  W/ LOOP ELECTRODE EXCISION     left eye surgery  1970   TUBAL LIGATION  1989    Family History  Problem Relation Age of Onset   Hyperlipidemia Mother    Arthritis Mother    Parkinson's disease Mother    Heart attack Father    Diabetes Father    Hypertension Father    Hyperlipidemia Father    Cancer Sister        cervical   Breast cancer Sister 7   Breast cancer Sister    Colon cancer Maternal Grandmother 29   Breast cancer Paternal Aunt    Kidney disease Other    Stroke Other    Pancreatic disease Other    Sickle cell anemia Other    Esophageal cancer Neg Hx    Rectal cancer Neg Hx     Social History Reviewed with no changes to be made today.   Outpatient Medications Prior to Visit  Medication Sig Dispense Refill   aspirin EC 81 MG tablet Take 81 mg by mouth daily.     glucose blood (ACCU-CHEK GUIDE) test strip Use as instructed. Check blood glucose by fingerstick twice per day. 100 each 12   loperamide (IMODIUM) 2 MG capsule Take 1 capsule (2 mg total) by mouth 4 (four) times daily as needed for diarrhea or loose stools. 12 capsule 0   nitroGLYCERIN (NITROSTAT) 0.4 MG SL tablet Place 1 tablet  (0.4 mg total) under the tongue every 5 (five) minutes as needed for chest pain. 25 tablet 5   pantoprazole (PROTONIX) 40 MG tablet Take 1 tablet (40 mg total) by mouth daily. 30 tablet 3   PARoxetine (PAXIL) 20 MG tablet Take 1 tablet by mouth once daily 90 tablet 0   sucralfate (CARAFATE) 1 g tablet Take 1 tablet (1 g total) by mouth 4 (four) times daily -  with meals and at bedtime. 40 tablet 0   triamcinolone cream (KENALOG) 0.1 % Apply 1 Application topically 2 (two) times daily. 30 g 0   valACYclovir (VALTREX) 1000 MG tablet TAKE 1  TABLET BY MOUTH THREE TIMES DAILY 270 tablet 0   albuterol (PROVENTIL) (2.5 MG/3ML) 0.083% nebulizer solution Take 3 mLs (2.5 mg total) by nebulization every 6 (six) hours as needed for wheezing or shortness of breath. 75 mL 12   albuterol (VENTOLIN HFA) 108 (90 Base) MCG/ACT inhaler Inhale 2 puffs into the lungs every 6 (six) hours as needed for wheezing or shortness of breath. 8 g 0   amLODipine (NORVASC) 2.5 MG tablet Take 1 tablet (2.5 mg total) by mouth daily. 90 tablet 3   benzonatate (TESSALON) 100 MG capsule Take 1 capsule (100 mg total) by mouth every 8 (eight) hours as needed for cough. 21 capsule 0   busPIRone (BUSPAR) 7.5 MG tablet Take 1 tablet (7.5 mg total) by mouth 2 (two) times daily. For anxiety 60 tablet 0   cyclobenzaprine (FLEXERIL) 5 MG tablet Take 1 tablet (5 mg total) by mouth at bedtime. 30 tablet 3   dicyclomine (BENTYL) 20 MG tablet Take 1 tablet (20 mg total) by mouth 2 (two) times daily. (Patient not taking: Reported on 09/25/2023) 20 tablet 0   fluticasone-salmeterol (ADVAIR HFA) 230-21 MCG/ACT inhaler Inhale 2 puffs into the lungs 2 (two) times daily. 12 g 12   gabapentin (NEURONTIN) 300 MG capsule Take 1 capsule (300 mg total) by mouth at bedtime. 90 capsule 1   glimepiride (AMARYL) 2 MG tablet Take 1 tablet by mouth once daily with breakfast 90 tablet 0   hydrocortisone (ANUSOL-HC) 2.5 % rectal cream Place 1 Application rectally 2  (two) times daily. 30 g 0   ibuprofen (ADVIL) 400 MG tablet TAKE 1 TABLET BY MOUTH EVERY 6 HOURS AS NEEDED FOR MODERATE PAIN 30 tablet 0   Lidocaine-Glycerin (PREPARATION H RE) Place 1 application  rectally as needed (hemmroids).     lisinopril (ZESTRIL) 5 MG tablet Take 1 tablet (5 mg total) by mouth daily. 90 tablet 1   meloxicam (MOBIC) 15 MG tablet Take 1 tablet (15 mg total) by mouth daily. 30 tablet 3   MELOXICAM PO Take by mouth.     metoprolol succinate (TOPROL-XL) 50 MG 24 hr tablet Take 1 tablet (50 mg total) by mouth daily. Take with or immediately following a meal. 90 tablet 1   pantoprazole (PROTONIX) 40 MG tablet Take 1 tablet (40 mg total) by mouth daily. 90 tablet 1   simvastatin (ZOCOR) 40 MG tablet Take 1 tablet (40 mg total) by mouth daily. 30 tablet 0   SYMBICORT 160-4.5 MCG/ACT inhaler Inhale 2 puffs into the lungs daily.     No facility-administered medications prior to visit.    Allergies  Allergen Reactions   Sulfa Antibiotics Anaphylaxis       Objective:    BP (!) 150/97 (BP Location: Left Arm, Patient Position: Sitting)   Pulse 79   Wt 189 lb (85.7 kg)   LMP 07/21/2013   SpO2 98%   BMI 34.57 kg/m  Wt Readings from Last 3 Encounters:  01/07/24 189 lb (85.7 kg)  09/25/23 182 lb (82.6 kg)  09/24/23 182 lb 9.6 oz (82.8 kg)  . BP Readings from Last 3 Encounters:  01/07/24 (!) 150/97  01/04/24 (!) 110/98  10/06/23 (!) 171/77     Physical Exam Vitals and nursing note reviewed.  Constitutional:      Appearance: Normal appearance.  HENT:     Head: Normocephalic.     Nose: Nose normal.  Cardiovascular:     Rate and Rhythm: Normal rate.     Heart  sounds: Normal heart sounds.  Pulmonary:     Effort: Pulmonary effort is normal.     Breath sounds: Normal breath sounds.  Musculoskeletal:     Cervical back: Normal, normal range of motion and neck supple.     Thoracic back: Normal.     Lumbar back: Tenderness present. No swelling.       Back:      Right hip: Tenderness present.     Left hip: Normal.       Legs:  Skin:    General: Skin is warm and moist.     Findings: Lesion (papular) present.          Comments: Quarter size sebaceous cyst  Neurological:     Mental Status: She is alert and oriented to person, place, and time.  Psychiatric:        Attention and Perception: Attention normal.        Mood and Affect: Mood is anxious.        Speech: Speech is rapid and pressured.        Behavior: Behavior is hyperactive. Behavior is cooperative.        Thought Content: Thought content normal.        Cognition and Memory: Cognition normal.        Judgment: Judgment normal.         Patient has been counseled extensively about nutrition and exercise as well as the importance of adherence with medications and regular follow-up. The patient was given clear instructions to go to ER or return to medical center if symptoms don't improve, worsen or new problems develop. The patient verbalized understanding.   Follow-up: Return in about 4 weeks (around 02/04/2024) for 4 weeks nurse visit BP.   Joette Catching, BSN RN-Student FNP [facility] [place], [place] [phone]   01/07/2024, 4:49 PM

## 2024-01-07 NOTE — Progress Notes (Signed)
 I have seen and examined this patient with the advanced practice provider STUDENT and agree with the note below

## 2024-01-09 ENCOUNTER — Telehealth: Payer: Self-pay | Admitting: Nurse Practitioner

## 2024-01-09 NOTE — Telephone Encounter (Signed)
 Contacted pt left vm to confirm appt 3/11

## 2024-01-12 ENCOUNTER — Encounter (HOSPITAL_COMMUNITY): Payer: Self-pay

## 2024-01-12 ENCOUNTER — Telehealth: Payer: Self-pay

## 2024-01-12 NOTE — Telephone Encounter (Signed)
 Copied from CRM [id number]. Topic: Clinical - Prescription Issue >> Jan 09, 2024  4:25 PM Ivette P wrote: Reason for CRM: Jess calling from [facility]  is calling because pt is requesting a medical equipment and they are needing a prescription Order and Office notes of pt last visit minutes. Requesting documents to be faxed over. Callback [phone]    Number Jess called from [phone]

## 2024-01-13 ENCOUNTER — Telehealth (HOSPITAL_COMMUNITY): Payer: Self-pay | Admitting: *Deleted

## 2024-01-13 ENCOUNTER — Telehealth: Payer: Self-pay

## 2024-01-13 ENCOUNTER — Other Ambulatory Visit

## 2024-01-13 NOTE — Telephone Encounter (Signed)
 Copied from CRM [id number]. Topic: Clinical - Prescription Issue >> Jan 13, 2024  2:41 PM Gery Pray wrote: Reason for CRM: Jessa from [facility] calling in regards to a prescription being faxed over to them but the prescription was not included on the fax. Please call [phone]

## 2024-01-13 NOTE — Telephone Encounter (Signed)
Attempted to call patient regarding upcoming cardiac PET appointment. Left message on voicemail with name and callback number  Larey Brick RN Navigator Cardiac Imaging [facility] [phone] Office [phone] Cell  Reminder to avoid caffeine 12 hours prior to her cardiac PET appt.

## 2024-01-13 NOTE — Telephone Encounter (Signed)
 Per Melissa Pham patient needs to have labs drawn before an order can be sent for the CGM can be sent.

## 2024-01-13 NOTE — Telephone Encounter (Signed)
Patient returning call about her upcoming cardiac imaging study; pt verbalizes understanding of appt date/time, parking situation and where to check in, pre-test NPO status; name and call back number provided for further questions should they arise  Melissa Brick RN Navigator Cardiac Imaging [facility] [phone] office [phone] cell  Patient aware to avoid caffeine 12 hours prior to her cardiac PET scan.

## 2024-01-13 NOTE — Telephone Encounter (Signed)
 [place]

## 2024-01-14 ENCOUNTER — Telehealth: Payer: Self-pay | Admitting: Nurse Practitioner

## 2024-01-14 ENCOUNTER — Ambulatory Visit (HOSPITAL_COMMUNITY): Admission: RE | Admit: 2024-01-14 | Payer: 59 | Source: Ambulatory Visit

## 2024-01-14 NOTE — Telephone Encounter (Signed)
 A document form has been faxed:  physician authorization form , to be filled out by provider. Send document back via Fax within 5-days. Document is located in providers tray at front office.           Fax number:  [phone]

## 2024-01-16 NOTE — Telephone Encounter (Signed)
 Duplicate

## 2024-01-21 DIAGNOSIS — J0110 Acute frontal sinusitis, unspecified: Secondary | ICD-10-CM | POA: Diagnosis not present

## 2024-01-21 DIAGNOSIS — H60311 Diffuse otitis externa, right ear: Secondary | ICD-10-CM | POA: Diagnosis not present

## 2024-01-22 ENCOUNTER — Ambulatory Visit: Attending: Primary Care

## 2024-01-22 ENCOUNTER — Telehealth (INDEPENDENT_AMBULATORY_CARE_PROVIDER_SITE_OTHER): Payer: Self-pay | Admitting: Nurse Practitioner

## 2024-01-22 ENCOUNTER — Other Ambulatory Visit: Payer: Self-pay | Admitting: Nurse Practitioner

## 2024-01-22 DIAGNOSIS — E119 Type 2 diabetes mellitus without complications: Secondary | ICD-10-CM | POA: Diagnosis not present

## 2024-01-22 NOTE — Telephone Encounter (Signed)
 Have her call her insurance to see if it is covered.

## 2024-01-22 NOTE — Telephone Encounter (Signed)
 Pt stated that she was recommended by the ER dr at [facility] stated that she should try Veosa for "Menopause". Pt's call back number is [phone].

## 2024-01-23 LAB — HEMOGLOBIN A1C
Est. average glucose Bld gHb Est-mCnc: 137 mg/dL
Hgb A1c MFr Bld: 6.4 % — ABNORMAL HIGH (ref 4.8–5.6)

## 2024-01-25 ENCOUNTER — Encounter: Payer: Self-pay | Admitting: Nurse Practitioner

## 2024-01-26 NOTE — Telephone Encounter (Signed)
 Call unanswered by patient.  Unable to leave voicemail.

## 2024-01-26 NOTE — Therapy (Signed)
 OUTPATIENT PHYSICAL THERAPY LOWER EXTREMITY EVALUATION   Patient Name: Melissa Pham MRN: [id number] DOB:02/15/1966, 58 y.o., female Today's Date: 01/27/2024  END OF SESSION:  PT End of Session - 01/27/24 1200     Visit Number 1    Number of Visits 13    Date for PT Re-Evaluation 03/19/24    Authorization Type [employer] DUAL COMPLETE    Authorization Time Period MEDICAID OF [place]    PT Start Time 1153    PT Stop Time 1235    PT Time Calculation (min) 42 min    Activity Tolerance Patient tolerated treatment well    Behavior During Therapy WFL for tasks assessed/performed             Past Medical History:  Diagnosis Date   ADHD (attention deficit hyperactivity disorder)    Anxiety    Asthma    Bipolar disorder (HCC)    Blood transfusion without reported diagnosis 11/05/1983   after childbirth   Coronary artery disease    Depression    Diabetes mellitus without complication (HCC)    GERD (gastroesophageal reflux disease)    History of illicit drug use    Hyperlipidemia    Hypertension    Schizophrenia (HCC)    Sickle cell trait (HCC)    Tobacco use    Past Surgical History:  Procedure Laterality Date   CERVICAL BIOPSY  W/ LOOP ELECTRODE EXCISION     left eye surgery  1970   TUBAL LIGATION  1989   Patient Active Problem List   Diagnosis Date Noted   Syncope 12/06/2018   CAD in native artery    Tobacco abuse 07/27/2018   Pure hypercholesterolemia    Generalized abdominal discomfort 12/10/2017   Effusion, right knee 03/13/2017   Sprain of right knee 03/13/2017   Chest pain 02/20/2015   Suprapubic pain 02/20/2015   Diarrhea 02/20/2015   Diabetes mellitus, type 2 (HCC) 02/20/2015   HTN (hypertension) 02/20/2015   Dyslipidemia 02/20/2015   Bipolar affective disorder (HCC) 02/20/2015   Schizophrenia (HCC) 02/20/2015   Hypokalemia 02/20/2015    PCP: Claiborne Rigg, NP   REFERRING PROVIDER: Claiborne Rigg, NP   REFERRING DIAG:  [phone]  (ICD-10-CM) - At high risk for injury related to fall  R53.1 (ICD-10-CM) - Weakness    THERAPY DIAG:  Difficulty in walking, not elsewhere classified - Plan: PT plan of care cert/re-cert  Other low back pain - Plan: PT plan of care cert/re-cert  Rationale for Evaluation and Treatment: Rehabilitation  ONSET DATE: 9 years- back pain and falls  SUBJECTIVE:   SUBJECTIVE STATEMENT: Pt reports a Hx of 4 falls over the past 6 months related to R low back and leg spasms. Her states at times her R foot swells and feels very tight. She notes she has lost weight from 240 to 190lbs. She endorses being very active participating in water areobics, Thi chi, martial arts, and being involved in church. She reports a Hx of low back pain which has been hurting worse since a fall when roller shating approx 1 year ago. She is to receive a steroid injection tomorrow for inflammation.   PERTINENT HISTORY: High BMI, DM, Sickle cell trait, bipolar, L hand in a splint due to L little finger injury  PAIN:  Are you having pain? Yes: NPRS scale: 6-10/10 Pain location: R low back and LE Pain description: Spasm Aggravating factors: Unsure Relieving factors: Drinking pickle juice, eating bananas , and wearing back brace   PRECAUTIONS:  None  RED FLAGS: None   WEIGHT BEARING RESTRICTIONS: No  FALLS:  Has patient fallen in last 6 months? Yes. Number of falls 4/10. 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 No acute fracture or focal pathologic process.   Paraspinal and other soft tissues: Negative.   Disc levels: Maintained at the lumbar level. T11-T12 intervertebral disc space vacuum phenomenon.   IMPRESSION: No acute displaced fracture or traumatic listhesis of the lumbar spine.     PATIENT SURVEYS:  ABC scale 25%  Low balance confidence  COGNITION: Overall cognitive status: Within functional limits for tasks assessed     SENSATION: WFL  EDEMA:  Not observed today. Pt reports her R foot will swell  MUSCLE LENGTH: Hamstrings: Right NT deg; Left NT deg Maisie Fus test: Right NT deg; Left NT deg  POSTURE: rounded shoulders and forward head  PALPATION: NT  LOWER EXTREMITY ROM:  WFLs Active ROM Right eval Left eval  Hip flexion    Hip extension    Hip abduction    Hip adduction    Hip internal rotation    Hip external rotation    Knee flexion    Knee extension    Ankle dorsiflexion    Ankle plantarflexion    Ankle inversion    Ankle eversion     (Blank rows = not tested)  LOWER EXTREMITY MMT:  MMT Right eval Left eval  Hip flexion 4+ 4+  Hip extension    Hip abduction 4+ 4+  Hip adduction    Hip internal rotation    Hip external rotation 4+ 4+  Knee flexion 5 5  Knee extension 5 5  Ankle dorsiflexion    Ankle plantarflexion    Ankle inversion    Ankle eversion     (Blank rows = not tested)  LOWER EXTREMITY SPECIAL TESTS:  Not tested  FUNCTIONAL TESTS:  5 times sit to stand: 24.8" s use of hands 2 minute walk test: TBA BERG: TBA  GAIT: Distance walked: 200' Assistive device utilized: Single point cane Level of assistance: Modified independence Comments: in r hand due to L hand in a splint                                                                                                                                TREATMENT DATE:  [facility] Adult PT  Treatment:                                                 DATE: 01/27/24 Therapeutic Exercise: Developed, instructed in, and pt completed therex as noted in HEP   PATIENT EDUCATION:  Education details: Eval findings, POC, HEP Person educated: Patient Education method: Explanation, Demonstration, Tactile cues, Verbal cues, and Handouts Education comprehension: verbalized understanding, returned demonstration, verbal cues required, and tactile cues required  HOME EXERCISE PROGRAM: Access Code: [email or web address] URL: [email or web address] Date: 01/27/2024 Prepared by: Joellyn Rued  Exercises - Supine Bridge with Resistance Band  - 1 x daily - 7 x weekly - 2 sets - 10-15 reps - 3 hold - Hooklying Clamshell with Resistance  - 1 x daily - 7 x weekly - 2 sets - 10-15 reps - 2 hold - Supine March with Resistance Band  - 1 x daily - 7 x weekly - 3 sets - 10-15 reps - 2 hold  ASSESSMENT:  CLINICAL IMPRESSION: Patient is a 58 y.o. female who was seen today for physical therapy evaluation and treatment for  Z91.81 (ICD-10-CM) - At high risk for injury related to fall  R53.1 (ICD-10-CM) - Weakness  Pt presents using a SPC for ambulation assist. Pt reports a significant Hx of falls related to low back pain and her R leg giving out. Pt's R leg tested with good strength equal to the L. Pt's functional 5xSTS time was significantly above norm and pt rated low on balance confidence on the ABC scale. Pt did demonstrate an unexpected ability, to the check out staff. of being able to a complete a split from standing and then returning. Pt's incidences of falling seemed linked to momentary occurances of back pain with the R leg giving out. A HEP was was initiated. Pt will benefit from skilled PT 2w6 to address impairments to optimize function with less pain.   OBJECTIVE IMPAIRMENTS: decreased activity tolerance, difficulty walking, obesity, and pain.   ACTIVITY LIMITATIONS: carrying, lifting, standing, squatting, and  locomotion level  PARTICIPATION LIMITATIONS: meal prep, cleaning, laundry, shopping, community activity, occupation, and church  PERSONAL FACTORS: Fitness, Past/current experiences, Time since onset of injury/illness/exacerbation, and 1-2 comorbidities:    High BMI, DM, Sickle cell trait, bipolar, L hand in a splint due to L little finger injury are also affecting patient's functional outcome.   REHAB POTENTIAL: Good  CLINICAL DECISION MAKING: Evolving/moderate complexity  EVALUATION COMPLEXITY: Moderate   GOALS:  SHORT TERM GOALS= LTGs  LONG TERM GOALS: Target date: 03/19/24  Pt will be Ind in a final HEP to maintain achieved LOF  Baseline: started Goal status: INITIAL  2.  Pt will report 50% or greater improvement in her low back pain. Baseline: 6-10/10 Goal status: INITIAL  3.  Improve 5xSTS by MCID of 5" and by MCID of 86ft as indication of improved functional mobility  Baseline: 5xsts 24.8 sec; TBA Goal status: INITIAL  4.  Improve ABC scale rating to 50% or greater as indication of improved confidence with balance Baseline: 25% Goal status: INITIAL  5.  Increase BERG balance by 5 points as indication of of improved functional balance Baseline: TBA Goal status: INITIAL   PLAN:  PT FREQUENCY: 2x/week  PT DURATION: 6 weeks  PLANNED INTERVENTIONS: 97164- PT Re-evaluation, 97110-Therapeutic exercises, 97530- Therapeutic activity, [id number]- Neuromuscular re-education, 97535- Self Care, 40347-  Manual therapy, [id number]- Gait training, [phone]- Electrical stimulation (unattended), Patient/Family education, Balance training, Stair training, Taping, Dry Needling, Joint mobilization, Spinal mobilization, Cryotherapy, and Moist heat  PLAN FOR NEXT SESSION: Assess and BERG; assess response to HEP; progress therex as indicated; use of modalities, manual therapy; and TPDN as indicated.    Elynn Patteson MS, PT 01/27/24 6:16 PM

## 2024-01-27 ENCOUNTER — Ambulatory Visit: Attending: Nurse Practitioner

## 2024-01-27 ENCOUNTER — Telehealth: Payer: Self-pay

## 2024-01-27 ENCOUNTER — Other Ambulatory Visit: Payer: Self-pay

## 2024-01-27 DIAGNOSIS — Z9181 History of falling: Secondary | ICD-10-CM | POA: Insufficient documentation

## 2024-01-27 DIAGNOSIS — M5459 Other low back pain: Secondary | ICD-10-CM | POA: Diagnosis not present

## 2024-01-27 DIAGNOSIS — R262 Difficulty in walking, not elsewhere classified: Secondary | ICD-10-CM

## 2024-01-27 DIAGNOSIS — R531 Weakness: Secondary | ICD-10-CM | POA: Diagnosis not present

## 2024-01-27 NOTE — Telephone Encounter (Signed)
 Call unanswered by patient.  Unable to leave voicemail.

## 2024-01-27 NOTE — Telephone Encounter (Signed)
 Patient is coming in to sign release of information.

## 2024-01-27 NOTE — Telephone Encounter (Signed)
 Copied from CRM [id number]. Topic: General - Other >> Jan 27, 2024  9:12 AM Carlatta H wrote: Reason for CRM: Patients charts notes and labs 3/20 to be faxed to [phone]//Advised to call insurance to see if veosa was covered//

## 2024-01-30 ENCOUNTER — Ambulatory Visit: Admitting: Nurse Practitioner

## 2024-01-30 ENCOUNTER — Encounter: Payer: Self-pay | Admitting: Nurse Practitioner

## 2024-01-30 ENCOUNTER — Ambulatory Visit: Attending: Nurse Practitioner | Admitting: Nurse Practitioner

## 2024-01-30 VITALS — BP 151/90 | Resp 19 | Ht 62.0 in | Wt 186.0 lb

## 2024-01-30 DIAGNOSIS — E1142 Type 2 diabetes mellitus with diabetic polyneuropathy: Secondary | ICD-10-CM | POA: Diagnosis not present

## 2024-01-30 DIAGNOSIS — H60332 Swimmer's ear, left ear: Secondary | ICD-10-CM | POA: Diagnosis not present

## 2024-01-30 DIAGNOSIS — Z7984 Long term (current) use of oral hypoglycemic drugs: Secondary | ICD-10-CM

## 2024-01-30 DIAGNOSIS — E119 Type 2 diabetes mellitus without complications: Secondary | ICD-10-CM

## 2024-01-30 MED ORDER — OZEMPIC (0.25 OR 0.5 MG/DOSE) 2 MG/1.5ML ~~LOC~~ SOPN: 0.2500 mg | PEN_INJECTOR | SUBCUTANEOUS | 1 refills | Status: DC

## 2024-01-30 NOTE — Progress Notes (Signed)
 Assessment & Plan:  Melissa Pham was seen today for medical management of chronic issues.  Diagnoses and all orders for this visit:  Diabetes mellitus treated with oral medication (HCC) -     Semaglutide,0.25 or 0.5MG /DOS, (OZEMPIC, 0.25 OR 0.5 MG/DOSE,) 2 MG/1.5ML SOPN; Inject 0.25 mg into the skin once a week.  Diabetic polyneuropathy associated with type 2 diabetes mellitus (HCC) -     Urine Albumin/Creatinine with ratio (send out) [LAB689]    Patient has been counseled on age-appropriate routine health concerns for screening and prevention. These are reviewed and up-to-date. Referrals have been placed accordingly. Immunizations are up-to-date or declined.    Subjective:   Chief Complaint  Patient presents with   Medical Management of Chronic Issues    Melissa Pham 58 y.o. female presents to office today DM She has her grandchildren with her today.   She has a past medical history of ADHD, Anxiety, Asthma, Bipolar disorder, Blood transfusion without reported diagnosis (11/05/1983), Coronary artery disease, Depression, DM 2, GERD, History of illicit drug use, Hyperlipidemia, Hypertension, Schizophrenia, Sickle cell trait,  and Tobacco use.   She is requesting a form be completed for a CGM and be faxed to the DME company. She is also requesting to be started on ozempic. She could not tolerate metformin in the past. Currently taking glimepiride 2 mg daily. BMI 34.02. She is not on insulin. Denies any symptoms of hypo or hyperglycemia. Highest A1c recorded 6.5.  Lab Results  Component Value Date   HGBA1C 6.4 (H) 01/22/2024     Review of Systems  Constitutional:  Negative for fever, malaise/fatigue and weight loss.  HENT: Negative.  Negative for nosebleeds.   Eyes: Negative.  Negative for blurred vision, double vision and photophobia.  Respiratory: Negative.  Negative for cough and shortness of breath.   Cardiovascular: Negative.  Negative for chest pain, palpitations and leg  swelling.  Gastrointestinal: Negative.  Negative for heartburn, nausea and vomiting.  Musculoskeletal: Negative.  Negative for myalgias.  Neurological: Negative.  Negative for dizziness, focal weakness, seizures and headaches.  Psychiatric/Behavioral: Negative.  Negative for suicidal ideas.     Past Medical History:  Diagnosis Date   ADHD (attention deficit hyperactivity disorder)    Anxiety    Asthma    Bipolar disorder (HCC)    Blood transfusion without reported diagnosis 11/05/1983   after childbirth   Coronary artery disease    Depression    Diabetes mellitus without complication (HCC)    GERD (gastroesophageal reflux disease)    History of illicit drug use    Hyperlipidemia    Hypertension    Schizophrenia (HCC)    Sickle cell trait (HCC)    Tobacco use     Past Surgical History:  Procedure Laterality Date   CERVICAL BIOPSY  W/ LOOP ELECTRODE EXCISION     left eye surgery  1970   TUBAL LIGATION  1989    Family History  Problem Relation Age of Onset   Hyperlipidemia Mother    Arthritis Mother    Parkinson's disease Mother    Heart attack Father    Diabetes Father    Hypertension Father    Hyperlipidemia Father    Cancer Sister        cervical   Breast cancer Sister 89   Breast cancer Sister    Colon cancer Maternal Grandmother 46   Breast cancer Paternal Aunt    Kidney disease Other    Stroke Other    Pancreatic disease Other  Sickle cell anemia Other    Esophageal cancer Neg Hx    Rectal cancer Neg Hx     Social History Reviewed with no changes to be made today.   Outpatient Medications Prior to Visit  Medication Sig Dispense Refill   albuterol (VENTOLIN HFA) 108 (90 Base) MCG/ACT inhaler Inhale 2 puffs into the lungs every 6 (six) hours as needed for wheezing or shortness of breath. 8 g 0   aspirin EC 81 MG tablet Take 81 mg by mouth daily.     fluticasone-salmeterol (ADVAIR HFA) 230-21 MCG/ACT inhaler Inhale 2 puffs into the lungs 2 (two) times  daily. 12 g 12   gabapentin (NEURONTIN) 300 MG capsule Take 1 capsule (300 mg total) by mouth at bedtime. 90 capsule 1   glimepiride (AMARYL) 2 MG tablet Take 1 tablet (2 mg total) by mouth daily with breakfast. 90 tablet 0   glucose blood (ACCU-CHEK GUIDE) test strip Use as instructed. Check blood glucose by fingerstick twice per day. 100 each 12   lisinopril (ZESTRIL) 20 MG tablet Take 1 tablet (20 mg total) by mouth daily. 90 tablet 1   pantoprazole (PROTONIX) 40 MG tablet Take 1 tablet (40 mg total) by mouth daily. 30 tablet 3   simvastatin (ZOCOR) 40 MG tablet Take 1 tablet (40 mg total) by mouth daily. 90 tablet 1   sucralfate (CARAFATE) 1 g tablet Take 1 tablet (1 g total) by mouth 4 (four) times daily -  with meals and at bedtime. 40 tablet 0   valACYclovir (VALTREX) 1000 MG tablet TAKE 1 TABLET BY MOUTH THREE TIMES DAILY 270 tablet 0   loperamide (IMODIUM) 2 MG capsule Take 1 capsule (2 mg total) by mouth 4 (four) times daily as needed for diarrhea or loose stools. (Patient not taking: Reported on 01/30/2024) 12 capsule 0   nitroGLYCERIN (NITROSTAT) 0.4 MG SL tablet Place 1 tablet (0.4 mg total) under the tongue every 5 (five) minutes as needed for chest pain. 25 tablet 5   PARoxetine (PAXIL) 20 MG tablet Take 1 tablet by mouth once daily (Patient not taking: Reported on 01/30/2024) 90 tablet 0   triamcinolone cream (KENALOG) 0.1 % Apply 1 Application topically 2 (two) times daily. (Patient not taking: Reported on 01/30/2024) 30 g 0   No facility-administered medications prior to visit.    Allergies  Allergen Reactions   Sulfa Antibiotics Anaphylaxis       Objective:    BP (!) 151/90 (BP Location: Left Arm, Patient Position: Sitting, Cuff Size: Normal)   Resp 19   Ht 5\' 2"  (1.575 m)   Wt 186 lb (84.4 kg)   LMP 07/21/2013   SpO2 100%   BMI 34.02 kg/m  Wt Readings from Last 3 Encounters:  01/30/24 186 lb (84.4 kg)  01/07/24 189 lb (85.7 kg)  09/25/23 182 lb (82.6 kg)     Physical Exam Vitals and nursing note reviewed.  Constitutional:      Appearance: She is well-developed.  HENT:     Head: Normocephalic and atraumatic.  Cardiovascular:     Rate and Rhythm: Normal rate and regular rhythm.     Heart sounds: Normal heart sounds. No murmur heard.    No friction rub. No gallop.  Pulmonary:     Effort: Pulmonary effort is normal. No tachypnea or respiratory distress.     Breath sounds: Normal breath sounds. No decreased breath sounds, wheezing, rhonchi or rales.  Chest:     Chest wall: No tenderness.  Abdominal:  General: Bowel sounds are normal.     Palpations: Abdomen is soft.  Musculoskeletal:        General: Normal range of motion.     Cervical back: Normal range of motion.  Skin:    General: Skin is warm and dry.  Neurological:     Mental Status: She is alert and oriented to person, place, and time.     Coordination: Coordination normal.  Psychiatric:        Behavior: Behavior normal. Behavior is cooperative.        Thought Content: Thought content normal.        Judgment: Judgment normal.          Patient has been counseled extensively about nutrition and exercise as well as the importance of adherence with medications and regular follow-up. The patient was given clear instructions to go to ER or return to medical center if symptoms don't improve, worsen or new problems develop. The patient verbalized understanding.   Follow-up: Return if symptoms worsen or fail to improve.   Claiborne Rigg, FNP-BC [facility] [place], [place] [phone]   01/30/2024, 11:29 PM

## 2024-02-02 ENCOUNTER — Telehealth: Payer: Self-pay

## 2024-02-02 ENCOUNTER — Other Ambulatory Visit: Payer: Self-pay | Admitting: Nurse Practitioner

## 2024-02-02 ENCOUNTER — Telehealth (HOSPITAL_COMMUNITY): Payer: Self-pay | Admitting: *Deleted

## 2024-02-02 DIAGNOSIS — E119 Type 2 diabetes mellitus without complications: Secondary | ICD-10-CM

## 2024-02-02 MED ORDER — RYBELSUS 3 MG PO TABS: 3.0000 mg | ORAL_TABLET | Freq: Every day | ORAL | 1 refills | Status: DC

## 2024-02-02 NOTE — Telephone Encounter (Signed)
 Received call from patient regarding upcoming cardiac imaging study; pt verbalizes understanding of appt date/time, parking situation and where to check in, pre-test NPO status and medications ordered, and verified current allergies; name and call back number provided for further questions should they arise Melissa Frame RN Navigator Cardiac Imaging [facility] [phone] office [phone] cell  Patient aware to avoid caffeine for 12 hours prior to test.

## 2024-02-02 NOTE — Telephone Encounter (Signed)
 Attempted to call patient regarding upcoming cardiac PET appointment. Left message on voicemail with name and callback number Johney Frame RN Navigator Cardiac Imaging [facility] [phone] Office  Advised to avoid caffeine for 12 hours prior to test.

## 2024-02-02 NOTE — Telephone Encounter (Signed)
 Copied from CRM [id number]. Topic: General - Other >> Jan 30, 2024  4:21 PM Turkey B wrote: Reason for CRM: Donnetta Simpers from pharmacy states only the Ozempic  3mg  is available for pt, so needs a new rx if this is ok

## 2024-02-02 NOTE — Telephone Encounter (Signed)
 Noted.

## 2024-02-02 NOTE — Telephone Encounter (Signed)
 Copied from CRM [id number]. Topic: Clinical - Prescription Issue >> Feb 02, 2024 10:46 AM Elle L wrote: Reason for CRM: The patient was following up on her Ozempic per the chart notes I see that the pharmacy has reached out previously so I advised the patient that the office is working on her request and the pharmacy should contact her when it is ready.

## 2024-02-03 ENCOUNTER — Ambulatory Visit (HOSPITAL_COMMUNITY): Admission: RE | Admit: 2024-02-03 | Source: Ambulatory Visit

## 2024-02-03 NOTE — Therapy (Incomplete)
 OUTPATIENT PHYSICAL THERAPY LOWER EXTREMITY EVALUATION   Patient Name: Melissa Pham MRN: [id number] DOB:01-Jul-1966, 58 y.o., female Today's Date: 02/03/2024  END OF SESSION:    Past Medical History:  Diagnosis Date   ADHD (attention deficit hyperactivity disorder)    Anxiety    Asthma    Bipolar disorder (HCC)    Blood transfusion without reported diagnosis 11/05/1983   after childbirth   Coronary artery disease    Depression    Diabetes mellitus without complication (HCC)    GERD (gastroesophageal reflux disease)    History of illicit drug use    Hyperlipidemia    Hypertension    Schizophrenia (HCC)    Sickle cell trait (HCC)    Tobacco use    Past Surgical History:  Procedure Laterality Date   CERVICAL BIOPSY  W/ LOOP ELECTRODE EXCISION     left eye surgery  1970   TUBAL LIGATION  1989   Patient Active Problem List   Diagnosis Date Noted   Syncope 12/06/2018   CAD in native artery    Tobacco abuse 07/27/2018   Pure hypercholesterolemia    Generalized abdominal discomfort 12/10/2017   Effusion, right knee 03/13/2017   Sprain of right knee 03/13/2017   Chest pain 02/20/2015   Suprapubic pain 02/20/2015   Diarrhea 02/20/2015   Diabetes mellitus, type 2 (HCC) 02/20/2015   HTN (hypertension) 02/20/2015   Dyslipidemia 02/20/2015   Bipolar affective disorder (HCC) 02/20/2015   Schizophrenia (HCC) 02/20/2015   Hypokalemia 02/20/2015    PCP: Claiborne Rigg, NP   REFERRING PROVIDER: Claiborne Rigg, NP   REFERRING DIAG:  Z91.81 (ICD-10-CM) - At high risk for injury related to fall  R53.1 (ICD-10-CM) - Weakness    THERAPY DIAG:  No diagnosis found.  Rationale for Evaluation and Treatment: Rehabilitation  ONSET DATE: 9 years- back pain and falls  SUBJECTIVE:   SUBJECTIVE STATEMENT: Pt reports a Hx of 4 falls over the past 6 months related to R low back and leg spasms. Her states at times her R foot swells and feels very tight. She notes she has  lost weight from 240 to 190lbs. She endorses being very active participating in water areobics, Thi chi, martial arts, and being involved in church. She reports a Hx of low back pain which has been hurting worse since a fall when roller shating approx 1 year ago. She is to receive a steroid injection tomorrow for inflammation.   PERTINENT HISTORY: High BMI, DM, Sickle cell trait, bipolar, L hand in a splint due to L little finger injury  PAIN:  Are you having pain? Yes: NPRS scale: 6-10/10 Pain location: R low back and LE Pain description: Spasm Aggravating factors: Unsure Relieving factors: Drinking pickle juice, eating bananas , and wearing back brace   PRECAUTIONS: None  RED FLAGS: None   WEIGHT BEARING RESTRICTIONS: No  FALLS:  Has patient fallen in last 6 months? Yes. Number of falls 4/10. 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  DIAGNOSTIC FINDINGS:  Narrative & Impression  Vertebrae: Facet arthropathy at the L4-L5 level with gas within the joint space. No acute fracture or focal pathologic process.   Paraspinal and other soft tissues: Negative.   Disc levels: Maintained at the lumbar level. T11-T12 intervertebral disc space vacuum phenomenon.   IMPRESSION: No acute displaced fracture or traumatic listhesis of the lumbar spine.     PATIENT SURVEYS:  ABC scale 25%  Low balance confidence  COGNITION: Overall cognitive status: Within functional limits for tasks assessed     SENSATION: WFL  EDEMA:  Not  observed today. Pt reports her R foot will swell  MUSCLE LENGTH: Hamstrings: Right NT deg; Left NT deg Maisie Fus test: Right NT deg; Left NT deg  POSTURE: rounded shoulders and forward head  PALPATION: NT  LOWER EXTREMITY ROM:  WFLs Active ROM Right eval Left eval  Hip flexion    Hip extension    Hip abduction    Hip adduction    Hip internal rotation    Hip external rotation    Knee flexion    Knee extension    Ankle dorsiflexion    Ankle plantarflexion    Ankle inversion    Ankle eversion     (Blank rows = not tested)  LOWER EXTREMITY MMT:  MMT Right eval Left eval  Hip flexion 4+ 4+  Hip extension    Hip abduction 4+ 4+  Hip adduction    Hip internal rotation    Hip external rotation 4+ 4+  Knee flexion 5 5  Knee extension 5 5  Ankle dorsiflexion    Ankle plantarflexion    Ankle inversion    Ankle eversion     (Blank rows = not tested)  LOWER EXTREMITY SPECIAL TESTS:  Not tested  FUNCTIONAL TESTS:  5 times sit to stand: 24.8" s use of hands 2 minute walk test: TBA BERG: TBA  GAIT: Distance walked: 200' Assistive device utilized: Single point cane Level of assistance: Modified independence Comments: in r hand due to L hand in a splint                                                                                                                                TREATMENT DATE:  [facility] Adult PT Treatment:                                                DATE: 02/04/24 Therapeutic Exercise: *** Manual Therapy: *** Neuromuscular re-ed: *** Therapeutic Activity: *** Modalities: *** Self Care: [facility] Adult PT Treatment:                                                DATE: 01/27/24  Therapeutic Exercise: Developed, instructed in, and pt completed therex as noted in HEP   PATIENT EDUCATION:  Education details: Eval findings, POC, HEP Person educated: Patient Education method: Explanation, Demonstration, Tactile cues, Verbal cues, and  Handouts Education comprehension: verbalized understanding, returned demonstration, verbal cues required, and tactile cues required  HOME EXERCISE PROGRAM: Access Code: [email or web address] URL: [email or web address] Date: 01/27/2024 Prepared by: Joellyn Rued  Exercises - Supine Bridge with Resistance Band  - 1 x daily - 7 x weekly - 2 sets - 10-15 reps - 3 hold - Hooklying Clamshell with Resistance  - 1 x daily - 7 x weekly - 2 sets - 10-15 reps - 2 hold - Supine March with Resistance Band  - 1 x daily - 7 x weekly - 3 sets - 10-15 reps - 2 hold  ASSESSMENT:  CLINICAL IMPRESSION: Patient is a 58 y.o. female who was seen today for physical therapy evaluation and treatment for  Z91.81 (ICD-10-CM) - At high risk for injury related to fall  R53.1 (ICD-10-CM) - Weakness  Pt presents using a SPC for ambulation assist. Pt reports a significant Hx of falls related to low back pain and her R leg giving out. Pt's R leg tested with good strength equal to the L. Pt's functional 5xSTS time was significantly above norm and pt rated low on balance confidence on the ABC scale. Pt did demonstrate an unexpected ability, to the check out staff, of being able to a complete a split from standing and then returning. Pt's incidences of falling seemed linked to momentary occurances of back pain with the R leg giving out. A HEP was was initiated. Pt will benefit from skilled PT 2w6 to address impairments to optimize function with less pain.   OBJECTIVE IMPAIRMENTS: decreased activity tolerance, difficulty walking, obesity, and pain.   ACTIVITY LIMITATIONS: carrying, lifting, standing, squatting, and locomotion level  PARTICIPATION LIMITATIONS: meal prep, cleaning, laundry, shopping, community activity, occupation, and church  PERSONAL FACTORS: Fitness, Past/current experiences, Time since onset of injury/illness/exacerbation, and 1-2 comorbidities:    High BMI, DM, Sickle cell trait, bipolar, L hand in a splint  due to L little finger injury are also affecting patient's functional outcome.   REHAB POTENTIAL: Good  CLINICAL DECISION MAKING: Evolving/moderate complexity  EVALUATION COMPLEXITY: Moderate   GOALS:  SHORT TERM GOALS= LTGs  LONG TERM GOALS: Target date: 03/19/24  Pt will be Ind in a final HEP to maintain achieved LOF  Baseline: started Goal status: INITIAL  2.  Pt will report 50% or greater improvement in her low back pain. Baseline: 6-10/10 Goal status: INITIAL  3.  Improve 5xSTS by MCID of 5" and by MCID of 26ft as indication of improved functional mobility  Baseline: 5xsts 24.8 sec; TBA Goal status: INITIAL  4.  Improve ABC scale rating to 50% or greater as indication of improved confidence with balance Baseline: 25% Goal status: INITIAL  5.  Increase BERG balance by 5 points as indication of of improved functional balance Baseline: TBA Goal status: INITIAL   PLAN:  PT FREQUENCY: 2x/week  PT DURATION: 6 weeks  PLANNED INTERVENTIONS: 97164- PT Re-evaluation, 97110-Therapeutic exercises, 97530- Therapeutic activity, 97112- Neuromuscular re-education, 97535- Self Care, 38756- Manual therapy, [phone]- Gait training, [phone]- Electrical stimulation (unattended), Patient/Family education, Balance training, Stair training, Taping, Dry Needling, Joint mobilization, Spinal mobilization, Cryotherapy, and Moist heat  PLAN FOR NEXT SESSION: Assess and BERG; assess response to HEP; progress therex as indicated; use of modalities, manual therapy; and TPDN  as indicated.      MS, PT 02/03/24 1:31 PM

## 2024-02-04 ENCOUNTER — Ambulatory Visit: Payer: Self-pay

## 2024-02-04 ENCOUNTER — Encounter: Payer: Self-pay | Admitting: Physical Therapy

## 2024-02-04 ENCOUNTER — Ambulatory Visit: Attending: Nurse Practitioner | Admitting: Physical Therapy

## 2024-02-04 ENCOUNTER — Inpatient Hospital Stay: Admitting: Nurse Practitioner

## 2024-02-04 DIAGNOSIS — M5459 Other low back pain: Secondary | ICD-10-CM | POA: Insufficient documentation

## 2024-02-04 DIAGNOSIS — R262 Difficulty in walking, not elsewhere classified: Secondary | ICD-10-CM | POA: Diagnosis not present

## 2024-02-04 NOTE — Therapy (Signed)
 OUTPATIENT PHYSICAL THERAPY LOWER EXTREMITY TREATMENT   Patient Name: Melissa Pham MRN: [id number] DOB:1965-11-19, 58 y.o., female Today's Date: 02/04/2024  END OF SESSION:  PT End of Session - 02/04/24 1142     Visit Number 2    Number of Visits 13    Date for PT Re-Evaluation 03/19/24    Authorization Type [employer] DUAL COMPLETE    Authorization Time Period [facility] Medicare/ MEDICAID Dual Complete    PT Start Time 1140    PT Stop Time 1222    PT Time Calculation (min) 42 min             Past Medical History:  Diagnosis Date   ADHD (attention deficit hyperactivity disorder)    Anxiety    Asthma    Bipolar disorder (HCC)    Blood transfusion without reported diagnosis 11/05/1983   after childbirth   Coronary artery disease    Depression    Diabetes mellitus without complication (HCC)    GERD (gastroesophageal reflux disease)    History of illicit drug use    Hyperlipidemia    Hypertension    Schizophrenia (HCC)    Sickle cell trait (HCC)    Tobacco use    Past Surgical History:  Procedure Laterality Date   CERVICAL BIOPSY  W/ LOOP ELECTRODE EXCISION     left eye surgery  1970   TUBAL LIGATION  1989   Patient Active Problem List   Diagnosis Date Noted   Syncope 12/06/2018   CAD in native artery    Tobacco abuse 07/27/2018   Pure hypercholesterolemia    Generalized abdominal discomfort 12/10/2017   Effusion, right knee 03/13/2017   Sprain of right knee 03/13/2017   Chest pain 02/20/2015   Suprapubic pain 02/20/2015   Diarrhea 02/20/2015   Diabetes mellitus, type 2 (HCC) 02/20/2015   HTN (hypertension) 02/20/2015   Dyslipidemia 02/20/2015   Bipolar affective disorder (HCC) 02/20/2015   Schizophrenia (HCC) 02/20/2015   Hypokalemia 02/20/2015    PCP: Claiborne Rigg, NP   REFERRING PROVIDER: Claiborne Rigg, NP   REFERRING DIAG:  [phone] (ICD-10-CM) - At high risk for injury related to fall  R53.1 (ICD-10-CM) - Weakness    THERAPY  DIAG:  Difficulty in walking, not elsewhere classified  Other low back pain  Rationale for Evaluation and Treatment: Rehabilitation  ONSET DATE: 9 years- back pain and falls  SUBJECTIVE:   SUBJECTIVE STATEMENT: The back is not hurting like it was last time. I've been stretching it this morning.    EVAL: Pt reports a Hx of 4 falls over the past 6 months related to R low back and leg spasms. Her states at times her R foot swells and feels very tight. She notes she has lost weight from 240 to 190lbs. She endorses being very active participating in water areobics, Thi chi, martial arts, and being involved in church. She reports a Hx of low back pain which has been hurting worse since a fall when roller shating approx 1 year ago. She is to receive a steroid injection tomorrow for inflammation.   PERTINENT HISTORY: High BMI, DM, Sickle cell trait, bipolar, L hand in a splint due to L little finger injury  PAIN:  Are you having pain? Yes: NPRS scale: 3/10 Pain location: R low back and LE Pain description: Spasm Aggravating factors: Unsure Relieving factors: Drinking pickle juice, eating bananas , and wearing back brace   PRECAUTIONS: None  RED FLAGS: None   WEIGHT BEARING RESTRICTIONS: No  FALLS:  Has patient fallen in last 6 months? Yes. Number of falls 4/10. 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  DIAGNOSTIC FINDINGS:  Narrative & Impression  Vertebrae: Facet arthropathy at the L4-L5 level with gas within the joint space.  No acute fracture or focal pathologic process.   Paraspinal and other soft tissues: Negative.   Disc levels: Maintained at the lumbar level. T11-T12 intervertebral disc space vacuum phenomenon.   IMPRESSION: No acute displaced fracture or traumatic listhesis of the lumbar spine.     PATIENT SURVEYS:  ABC scale 25%  Low balance confidence  COGNITION: Overall cognitive status: Within functional limits for tasks assessed     SENSATION: WFL  EDEMA:  Not observed today. Pt reports her R foot will swell  MUSCLE LENGTH: Hamstrings: Right NT deg; Left NT deg Maisie Fus test: Right NT deg; Left NT deg  POSTURE: rounded shoulders and forward head  PALPATION: NT  LOWER EXTREMITY ROM:  WFLs Active ROM Right eval Left eval  Hip flexion    Hip extension    Hip abduction    Hip adduction    Hip internal rotation    Hip external rotation    Knee flexion    Knee extension    Ankle dorsiflexion    Ankle plantarflexion    Ankle inversion    Ankle eversion     (Blank rows = not tested)  LOWER EXTREMITY MMT:  MMT Right eval Left eval  Hip flexion 4+ 4+  Hip extension    Hip abduction 4+ 4+  Hip adduction    Hip internal rotation    Hip external rotation 4+ 4+  Knee flexion 5 5  Knee extension 5 5  Ankle dorsiflexion    Ankle plantarflexion    Ankle inversion    Ankle eversion     (Blank rows = not tested)  LOWER EXTREMITY SPECIAL TESTS:  Not tested  FUNCTIONAL TESTS:  5 times sit to stand: 24.8" s use of hands 2 minute walk test: TBA: 02/04/24: 2 MWT: 304 feet with SPC BERG: TBA: 02/04/24: 55/56  GAIT: Distance walked: 200' Assistive device utilized: Single point cane Level of assistance: Modified independence Comments: in r hand due to L hand in a splint                                                                                                                                TREATMENT DATE:  [facility] Adult PT Treatment:  DATE: 02/04/24 BERG BALANCE TEST Sitting to Standing: 4.      Stands without using hands and stabilize independently Standing Unsupported: 4.      Stands safely for 2 minutes Sitting Unsupported: 4.     Sits for 2 minutes independently Standing to Sitting: 4.     Sits safely with minimal use of hands Transfers: 4.     Transfers safely with minor use of hands Standing with eyes closed: 4.     Stands safely for 10 seconds  Standing with feet together: 4.     Stands for 1 minute safely Reaching forward with outstretched arm: 4.     Reaches forward 10 inches Retrieving object from the floor: 4.      Able to pick up easily and safely Turning to look behind: 4.     Looks behind from both sides and weight shifts well Turning 360 degrees: 4.     Able to turn in </=4 seconds  Place alternate foot on stool: 4.     Completes 8 steps in 20 seconds     Standing with one foot in front: 3.     Independent foot ahead for 30 seconds Standing on one foot: 4.     Holds >10 seconds  Total Score: 55/56   BERG < 36 high risk for falls (close to 100%) 46-51 moderate (>50%)  37-45 significant (>80%) 52-55 lower (> 25%)  Pt uses walker full-time: 26.7 - 39.6 indicates significant to high fall risk Pt uses cane indoor: 44 - 46.5 indicates significant to high fall risk Pt uses cane outdoor: 47-49.6 indicates significant to high fall risk   Therapeutic Activity:  2 MWT: 304 feet with SPC  Therapeutic Exercise: Exercises - Supine Bridge with Resistance Band  - 1 x daily - 7 x weekly - 2 sets - 10-15 reps - 3 hold - Hooklying Clamshell with Resistance  - 1 x daily - 7 x weekly - 2 sets - 10-15 reps - 2 hold - Supine March with Resistance Band  - 1 x daily - 7 x weekly - 3 sets - 10-15 reps - 2 hold    [facility] Adult PT Treatment:                                                DATE: 01/27/24 Therapeutic Exercise: Developed, instructed in, and pt completed therex as noted in HEP   PATIENT EDUCATION:   Education details: Eval findings, POC, HEP Person educated: Patient Education method: Explanation, Demonstration, Tactile cues, Verbal cues, and Handouts Education comprehension: verbalized understanding, returned demonstration, verbal cues required, and tactile cues required  HOME EXERCISE PROGRAM: Access Code: [email or web address] URL: [email or web address] Date: 01/27/2024 Prepared by: Joellyn Rued  Exercises - Supine Bridge with Resistance Band  - 1 x daily - 7 x weekly - 2 sets - 10-15 reps - 3 hold - Hooklying Clamshell with Resistance  - 1 x daily - 7 x weekly - 2 sets - 10-15 reps - 2 hold - Supine March with Resistance Band  - 1 x daily - 7 x weekly - 3 sets - 10-15 reps - 2 hold  ASSESSMENT:  CLINICAL IMPRESSION: Pt arrives for first treatment. She has had deaths in her family and has not started her HEP. Today we reviewed her HEP which she required Mod cues for technique, especially for  engaging abdominals, control of pace and breathing. She plans to work on her HEP prior to next visit. Also captured BERG at 55/56 which does not indicate fall risk with static balance. Will perform dynamic balance assessment next session. Captured 2 MWT of 304 feet using SPC with pt becoming fatigued and c/o increased R LBP after 1 minute. She did not stop during test.    EVAL: Patient is a 58 y.o. female who was seen today for physical therapy evaluation and treatment for  Z91.81 (ICD-10-CM) - At high risk for injury related to fall  R53.1 (ICD-10-CM) - Weakness  Pt presents using a SPC for ambulation assist. Pt reports a significant Hx of falls related to low back pain and her R leg giving out. Pt's R leg tested with good strength equal to the L. Pt's functional 5xSTS time was significantly above norm and pt rated low on balance confidence on the ABC scale. Pt did demonstrate an unexpected ability, to the check out staff. of being able to a complete a split from standing and then returning.  Pt's incidences of falling seemed linked to momentary occurances of back pain with the R leg giving out. A HEP was was initiated. Pt will benefit from skilled PT 2w6 to address impairments to optimize function with less pain.   OBJECTIVE IMPAIRMENTS: decreased activity tolerance, difficulty walking, obesity, and pain.   ACTIVITY LIMITATIONS: carrying, lifting, standing, squatting, and locomotion level  PARTICIPATION LIMITATIONS: meal prep, cleaning, laundry, shopping, community activity, occupation, and church  PERSONAL FACTORS: Fitness, Past/current experiences, Time since onset of injury/illness/exacerbation, and 1-2 comorbidities:    High BMI, DM, Sickle cell trait, bipolar, L hand in a splint due to L little finger injury are also affecting patient's functional outcome.   REHAB POTENTIAL: Good  CLINICAL DECISION MAKING: Evolving/moderate complexity  EVALUATION COMPLEXITY: Moderate   GOALS:  SHORT TERM GOALS= LTGs  LONG TERM GOALS: Target date: 03/19/24  Pt will be Ind in a final HEP to maintain achieved LOF  Baseline: started Goal status: INITIAL  2.  Pt will report 50% or greater improvement in her low back pain. Baseline: 6-10/10 Goal status: INITIAL  3.  Improve 5xSTS by MCID of 5" and by MCID of 4ft as indication of improved functional mobility  Baseline: 5xsts 24.8 sec; TBA 02/04/24: 304 feet  Goal status: ONGOING  4.  Improve ABC scale rating to 50% or greater as indication of improved confidence with balance Baseline: 25% Goal status: INITIAL  5.  Increase BERG balance by 5 points as indication of of improved functional balance Baseline: TBA 02/04/24: 55/56 Goal status: ONGOING/ Need to MODIFY   PLAN:  PT FREQUENCY: 2x/week  PT DURATION: 6 weeks  PLANNED INTERVENTIONS: 97164- PT Re-evaluation, 97110-Therapeutic exercises, 97530- Therapeutic activity, 97112- Neuromuscular re-education, 97535- Self Care, 16109- Manual therapy, [phone]- Gait training,  [phone]- Electrical stimulation (unattended), Patient/Family education, Balance training, Stair training, Taping, Dry Needling, Joint mobilization, Spinal mobilization, Cryotherapy, and Moist heat  PLAN FOR NEXT SESSION: Assess and BERG; assess response to HEP; progress therex as indicated; use of modalities, manual therapy; and TPDN as indicated.    Jannette Spanner, PTA 02/04/24 12:38 PM Phone: [phone] Fax: [phone]

## 2024-02-05 NOTE — Telephone Encounter (Signed)
 Copied from CRM [id number]. Topic: Clinical - Prescription Issue >> Feb 04, 2024  3:56 PM Everette C wrote: Reason for CRM: Melissa Pham with [facility] has called to request contact with a member of staff when possible to discuss a prescription change for the patient's Semaglutide (RYBELSUS) 3 MG TABS [[id number]]   Please contact further when possible

## 2024-02-09 NOTE — Telephone Encounter (Signed)
 S/w [facility]. Questions answered.

## 2024-02-10 ENCOUNTER — Telehealth: Payer: Self-pay | Admitting: Nurse Practitioner

## 2024-02-10 ENCOUNTER — Other Ambulatory Visit: Payer: Self-pay | Admitting: Nurse Practitioner

## 2024-02-10 DIAGNOSIS — J4520 Mild intermittent asthma, uncomplicated: Secondary | ICD-10-CM

## 2024-02-10 MED ORDER — FLUTICASONE FUROATE-VILANTEROL 100-25 MCG/ACT IN AEPB: 1.0000 | INHALATION_SPRAY | Freq: Every day | RESPIRATORY_TRACT | 11 refills | Status: AC

## 2024-02-10 NOTE — Telephone Encounter (Signed)
 Copied from CRM [id number]. Topic: General - Other >> Feb 10, 2024 11:28 AM Emylou G wrote: Reason for CRM: Samson Frederic called from [employer].Marland Kitchen albuterol (VENTOLIN HFA) 108 (90 Base) MCG/ACT inhaler looking to see if we can approve the preferred inhaler: symbacort brio or dulara?  They faxed request on the 6th.. their number [phone]

## 2024-02-10 NOTE — Telephone Encounter (Signed)
 BREO sent

## 2024-02-11 ENCOUNTER — Ambulatory Visit

## 2024-02-13 ENCOUNTER — Encounter: Payer: Self-pay | Admitting: Physical Therapy

## 2024-02-13 ENCOUNTER — Ambulatory Visit: Admitting: Physical Therapy

## 2024-02-13 DIAGNOSIS — R262 Difficulty in walking, not elsewhere classified: Secondary | ICD-10-CM

## 2024-02-13 DIAGNOSIS — M5459 Other low back pain: Secondary | ICD-10-CM | POA: Diagnosis not present

## 2024-02-13 NOTE — Therapy (Addendum)
 OUTPATIENT PHYSICAL THERAPY LOWER EXTREMITY TREATMENT/DC   Patient Name: Melissa Pham MRN: [id number] DOB:Dec 15, 1965, 58 y.o., female Today's Date: 02/13/2024  END OF SESSION:  PT End of Session - 02/13/24 1057     Visit Number 3    Number of Visits 13    Date for PT Re-Evaluation 03/19/24    Authorization Type [employer] DUAL COMPLETE    Authorization Time Period [facility] Medicare/ MEDICAID Dual Complete    PT Start Time 1100    PT Stop Time 1138    PT Time Calculation (min) 38 min             Past Medical History:  Diagnosis Date   ADHD (attention deficit hyperactivity disorder)    Anxiety    Asthma    Bipolar disorder (HCC)    Blood transfusion without reported diagnosis 11/05/1983   after childbirth   Coronary artery disease    Depression    Diabetes mellitus without complication (HCC)    GERD (gastroesophageal reflux disease)    History of illicit drug use    Hyperlipidemia    Hypertension    Schizophrenia (HCC)    Sickle cell trait (HCC)    Tobacco use    Past Surgical History:  Procedure Laterality Date   CERVICAL BIOPSY  W/ LOOP ELECTRODE EXCISION     left eye surgery  1970   TUBAL LIGATION  1989   Patient Active Problem List   Diagnosis Date Noted   Syncope 12/06/2018   CAD in native artery    Tobacco abuse 07/27/2018   Pure hypercholesterolemia    Generalized abdominal discomfort 12/10/2017   Effusion, right knee 03/13/2017   Sprain of right knee 03/13/2017   Chest pain 02/20/2015   Suprapubic pain 02/20/2015   Diarrhea 02/20/2015   Diabetes mellitus, type 2 (HCC) 02/20/2015   HTN (hypertension) 02/20/2015   Dyslipidemia 02/20/2015   Bipolar affective disorder (HCC) 02/20/2015   Schizophrenia (HCC) 02/20/2015   Hypokalemia 02/20/2015    PCP: Theotis Haze ORN, NP   REFERRING PROVIDER: Theotis Haze ORN, NP   REFERRING DIAG:  [phone] (ICD-10-CM) - At high risk for injury related to fall  R53.1 (ICD-10-CM) - Weakness    THERAPY  DIAG:  Other low back pain  Difficulty in walking, not elsewhere classified  Rationale for Evaluation and Treatment: Rehabilitation  ONSET DATE: 9 years- back pain and falls  SUBJECTIVE:   SUBJECTIVE STATEMENT: My back will catch. When that happened I fell in December but I have not had any falls in the last several months.    EVAL: Pt reports a Hx of 4 falls over the past 6 months related to R low back and leg spasms. Her states at times her R foot swells and feels very tight. She notes she has lost weight from 240 to 190lbs. She endorses being very active participating in water areobics, Thi chi, martial arts, and being involved in church. She reports a Hx of low back pain which has been hurting worse since a fall when roller shating approx 1 year ago. She is to receive a steroid injection tomorrow for inflammation.   PERTINENT HISTORY: High BMI, DM, Sickle cell trait, bipolar, L hand in a splint due to L little finger injury  PAIN:  Are you having pain? Yes: NPRS scale: 4/10 Pain location: R low back and LE Pain description: Spasm Aggravating factors: Unsure Relieving factors: Drinking pickle juice, eating bananas , and wearing back brace   PRECAUTIONS: None  RED FLAGS:  None   WEIGHT BEARING RESTRICTIONS: No  FALLS:  Has patient fallen in last 6 months? Yes. Number of falls 4/10. 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  DIAGNOSTIC FINDINGS:  Narrative & Impression  Vertebrae: Facet arthropathy at the L4-L5 level with  gas within the joint space. No acute fracture or focal pathologic process.   Paraspinal and other soft tissues: Negative.   Disc levels: Maintained at the lumbar level. T11-T12 intervertebral disc space vacuum phenomenon.   IMPRESSION: No acute displaced fracture or traumatic listhesis of the lumbar spine.     PATIENT SURVEYS:  ABC scale 25% Low balance confidence ABC scale 88.75 % 02/13/24   COGNITION: Overall cognitive status: Within functional limits for tasks assessed     SENSATION: WFL  EDEMA:  Not observed today. Pt reports her R foot will swell  MUSCLE LENGTH: Hamstrings: Right NT deg; Left NT deg Debby test: Right NT deg; Left NT deg  POSTURE: rounded shoulders and forward head  PALPATION: NT  LOWER EXTREMITY ROM:  WFLs Active ROM Right eval Left eval  Hip flexion    Hip extension    Hip abduction    Hip adduction    Hip internal rotation    Hip external rotation    Knee flexion    Knee extension    Ankle dorsiflexion    Ankle plantarflexion    Ankle inversion    Ankle eversion     (Blank rows = not tested)  LOWER EXTREMITY MMT:  MMT Right eval Left eval  Hip flexion 4+ 4+  Hip extension    Hip abduction 4+ 4+  Hip adduction    Hip internal rotation    Hip external rotation 4+ 4+  Knee flexion 5 5  Knee extension 5 5  Ankle dorsiflexion    Ankle plantarflexion    Ankle inversion    Ankle eversion     (Blank rows = not tested)  LOWER EXTREMITY SPECIAL TESTS:  Not tested  FUNCTIONAL TESTS:  5 times sit to stand: 24.8 s use of hands 2 minute walk test: TBA: 02/04/24: 2 MWT: 304 feet with SPC; 02/13/24: 457 feet  BERG: TBA: 02/04/24: 55/56  GAIT: Distance walked: 200' Assistive device utilized: Single point cane Level of assistance: Modified independence Comments: in r hand due to L hand in a splint  TREATMENT DATE:  [facility] Adult PT Treatment:                                                DATE: 02/13/24 Therapeutic Exercise: Exercises - Supine Bridge with Resistance Band  - 1 x daily - 7 x weekly - 2 sets - 10-15 reps - 3 hold - Hooklying Clamshell with Resistance  - 1 x daily - 7 x weekly - 2 sets - 10-15 reps - 2 hold - Supine March with Resistance Band  - 1 x daily - 7 x weekly - 3 sets - 10-15 reps - 2 hold LTR SKTC STS x 10   Therapeutic Activity:  [facility] PT Assessment - 02/13/24 0001       Standardized Balance Assessment   Standardized Balance Assessment Dynamic Gait Index      Dynamic Gait Index   Level Surface Normal    Change in Gait Speed Normal    Gait with Horizontal Head Turns Normal    Gait with Vertical Head Turns Mild Impairment    Gait and Pivot Turn Normal    Step Over Obstacle Normal    Step Around Obstacles Normal    Steps Mild Impairment    Total Score 22                [facility] Adult PT Treatment:                                                DATE: 02/04/24 BERG BALANCE TEST Sitting to Standing: 4.      Stands without using hands and stabilize independently Standing Unsupported: 4.      Stands safely for 2 minutes Sitting Unsupported: 4.     Sits for 2 minutes independently Standing to Sitting: 4.     Sits safely with minimal use of hands Transfers: 4.     Transfers safely with minor use of hands Standing with eyes closed: 4.     Stands safely for 10 seconds  Standing with feet together: 4.     Stands for 1 minute safely Reaching forward with outstretched arm: 4.     Reaches forward 10 inches Retrieving object from the floor: 4.      Able to pick up easily and safely Turning to look behind: 4.     Looks behind from both sides and weight shifts well Turning 360 degrees: 4.     Able to turn in </=4 seconds  Place alternate foot on stool: 4.     Completes 8 steps in 20 seconds     Standing with one foot in front: 3.     Independent foot ahead for 30  seconds Standing on one foot: 4.     Holds >10 seconds  Total Score: 55/56   BERG < 36 high risk for falls (close to 100%) 46-51 moderate (>50%)  37-45 significant (>80%) 52-55 lower (> 25%)  Pt uses walker full-time: 26.7 - 39.6 indicates significant to high fall risk Pt uses cane indoor: 44 - 46.5 indicates significant to high fall risk Pt uses cane outdoor: 47-49.6 indicates significant to high fall risk   Therapeutic Activity:  2 MWT: 304 feet with SPC  Therapeutic Exercise:  Exercises - Supine Bridge with Resistance Band  - 1 x daily - 7 x weekly - 2 sets - 10-15 reps - 3 hold - Hooklying Clamshell with Resistance  - 1 x daily - 7 x weekly - 2 sets - 10-15 reps - 2 hold - Supine March with Resistance Band  - 1 x daily - 7 x weekly - 3 sets - 10-15 reps - 2 hold    [facility] Adult PT Treatment:                                                DATE: 01/27/24 Therapeutic Exercise: Developed, instructed in, and pt completed therex as noted in HEP   PATIENT EDUCATION:  Education details: Eval findings, POC, HEP Person educated: Patient Education method: Explanation, Demonstration, Tactile cues, Verbal cues, and Handouts Education comprehension: verbalized understanding, returned demonstration, verbal cues required, and tactile cues required  HOME EXERCISE PROGRAM: Access Code: [email or web address] URL: [email or web address] Date: 01/27/2024 Prepared by: Dasie Daft  Exercises - Supine Bridge with Resistance Band  - 1 x daily - 7 x weekly - 2 sets - 10-15 reps - 3 hold - Hooklying Clamshell with Resistance  - 1 x daily - 7 x weekly - 2 sets - 10-15 reps - 2 hold - Supine March with Resistance Band  - 1 x daily - 7 x weekly - 3 sets - 10-15 reps - 2 hold  ASSESSMENT:  CLINICAL IMPRESSION: Pt arrives for second treatment. She does not complete HEP although she does attend exercise class. Her back pain continues to be variable. Her DGI 22/24 indicating low risk for falls. Her 5 x  STS and 2 MWT have significantly improved., meeting all functional and subjective goals. Pt is appropriate for discharge and agreeable. She will try to increase HEP compliance to improved core strength to assist with back pain. Reprinted HEP.     EVAL: Patient is a 58 y.o. female who was seen today for physical therapy evaluation and treatment for  Z91.81 (ICD-10-CM) - At high risk for injury related to fall  R53.1 (ICD-10-CM) - Weakness  Pt presents using a SPC for ambulation assist. Pt reports a significant Hx of falls related to low back pain and her R leg giving out. Pt's R leg tested with good strength equal to the L. Pt's functional 5xSTS time was significantly above norm and pt rated low on balance confidence on the ABC scale. Pt did demonstrate an unexpected ability, to the check out staff. of being able to a complete a split from standing and then returning. Pt's incidences of falling seemed linked to momentary occurances of back pain with the R leg giving out. A HEP was was initiated. Pt will benefit from skilled PT 2w6 to address impairments to optimize function with less pain.   OBJECTIVE IMPAIRMENTS: decreased activity tolerance, difficulty walking, obesity, and pain.   ACTIVITY LIMITATIONS: carrying, lifting, standing, squatting, and locomotion level  PARTICIPATION LIMITATIONS: meal prep, cleaning, laundry, shopping, community activity, occupation, and church  PERSONAL FACTORS: Fitness, Past/current experiences, Time since onset of injury/illness/exacerbation, and 1-2 comorbidities:   High BMI, DM, Sickle cell trait, bipolar, L hand in a splint due to L little finger injury are also affecting patient's functional outcome.   REHAB POTENTIAL: Good  CLINICAL DECISION MAKING: Evolving/moderate complexity  EVALUATION COMPLEXITY: Moderate   GOALS:  SHORT TERM  GOALS= LTGs  LONG TERM GOALS: Target date: 03/19/24  Pt will be Ind in a final HEP to maintain achieved LOF  Baseline:  started Goal status: MET  2.  Pt will report 50% or greater improvement in her low back pain. Baseline: 6-10/10 02/13/24: no change  Goal status: NOT MET   3.  Improve 5xSTS by MCID of 5 and by MCID of 31ft as indication of improved functional mobility  Baseline: 5xsts 24.8 sec; TBA 02/04/24: 304 feet  02/13/24: 6.8 sec 5 x STS/ 457 feet Goal status: MET   4.  Improve ABC scale rating to 50% or greater as indication of improved confidence with balance Baseline: 25% 02/13/24: 88.75%  Goal status: MET   5.  Increase BERG balance by 5 points as indication of of improved functional balance Baseline: TBA 02/04/24: 55/56 Goal status: DEFERRED    PLAN:  PT FREQUENCY: 2x/week  PT DURATION: 6 weeks  PLANNED INTERVENTIONS: 97164- PT Re-evaluation, 97110-Therapeutic exercises, 97530- Therapeutic activity, 97112- Neuromuscular re-education, 97535- Self Care, [id number]- Manual therapy, [phone]- Gait training, [phone]- Electrical stimulation (unattended), Patient/Family education, Balance training, Stair training, Taping, Dry Needling, Joint mobilization, Spinal mobilization, Cryotherapy, and Moist heat  PLAN FOR NEXT SESSION: N/a DC to HEP   Harlene Persons, PTA 02/13/24 11:43 AM Phone: [phone] Fax: [phone]   PHYSICAL THERAPY DISCHARGE SUMMARY  Visits from Start of Care: 3  Current functional level related to goals / functional outcomes: See clinical impression and PT goals    Remaining deficits: See clinical impression and PT goals    Education / Equipment: HEP/Pt Ed   Patient agrees to discharge. Patient goals were partially met. Patient is being discharged due to the patient's request.  Dasie Daft MS, PT 06/09/24 5:16 PM

## 2024-02-16 ENCOUNTER — Telehealth: Admitting: Nurse Practitioner

## 2024-02-17 ENCOUNTER — Ambulatory Visit: Admitting: Physical Therapy

## 2024-02-19 ENCOUNTER — Ambulatory Visit

## 2024-02-24 ENCOUNTER — Encounter: Admitting: Physical Therapy

## 2024-02-26 ENCOUNTER — Ambulatory Visit (HOSPITAL_COMMUNITY)
Admission: EM | Admit: 2024-02-26 | Discharge: 2024-02-26 | Disposition: A | Discharge status: Home / Self Care | Attending: Family Medicine | Admitting: Family Medicine

## 2024-02-26 ENCOUNTER — Encounter (HOSPITAL_COMMUNITY): Payer: Self-pay

## 2024-02-26 ENCOUNTER — Ambulatory Visit (INDEPENDENT_AMBULATORY_CARE_PROVIDER_SITE_OTHER)

## 2024-02-26 ENCOUNTER — Encounter

## 2024-02-26 DIAGNOSIS — M79645 Pain in left finger(s): Secondary | ICD-10-CM

## 2024-02-26 DIAGNOSIS — I10 Essential (primary) hypertension: Secondary | ICD-10-CM

## 2024-02-26 DIAGNOSIS — M154 Erosive (osteo)arthritis: Secondary | ICD-10-CM

## 2024-02-26 DIAGNOSIS — G8929 Other chronic pain: Secondary | ICD-10-CM | POA: Diagnosis not present

## 2024-02-26 NOTE — ED Triage Notes (Signed)
 Patient here today with c/o left 5th finger pain for over a year. Patient has been using a splint which has helped but patient states that her splint was eaten by her dog.

## 2024-02-26 NOTE — Discharge Instructions (Signed)
 Your blood pressure was noted to be elevated during your visit today. If you are currently taking medication for high blood pressure, please ensure you are taking this as directed. If you do not have a history of high blood pressure and your blood pressure remains persistently elevated, you may need to begin taking a medication at some point. You may return here within the next few days to recheck if unable to see your primary care provider or if you do not have a one.  BP (!) 160/86 (BP Location: Left Arm)   Pulse 92   Temp 98 F (36.7 C) (Oral)   Resp 16   LMP 07/21/2013   SpO2 94%   BP Readings from Last 3 Encounters:  02/26/24 (!) 160/86  01/30/24 (!) 151/90  01/07/24 (!) 150/97

## 2024-02-26 NOTE — ED Provider Notes (Addendum)
 [facility]   [id number] 02/26/24 Arrival Time: 1148  ASSESSMENT & PLAN:  1. Finger pain, left   2. Erosive osteoarthritis   3. Elevated blood pressure reading with diagnosis of hypertension    I have personally viewed and independently interpreted the imaging studies ordered this visit. Left 5th finger: erosive changes of DIP.  Orders Placed This Encounter  Procedures   DG Finger Little Left   Apply finger splint static  Stax finger splint applied.  Recommend:  Follow-up Information     Schedule an appointment as soon as possible for a visit  with Ltanya Rummer, MD.   Specialty: Orthopedic Surgery Contact information: [address] [place] [address] [phone]                  Discharge Instructions      Your blood pressure was noted to be elevated during your visit today. If you are currently taking medication for high blood pressure, please ensure you are taking this as directed. If you do not have a history of high blood pressure and your blood pressure remains persistently elevated, you may need to begin taking a medication at some point. You may return here within the next few days to recheck if unable to see your primary care provider or if you do not have a one.  BP (!) 160/86 (BP Location: Left Arm)   Pulse 92   Temp 98 F (36.7 C) (Oral)   Resp 16   LMP 07/21/2013   SpO2 94%   BP Readings from Last 3 Encounters:  02/26/24 (!) 160/86  01/30/24 (!) 151/90  01/07/24 (!) 150/97         Reviewed expectations re: course of current medical issues. Questions answered. Outlined signs and symptoms indicating need for more acute intervention. Patient verbalized understanding. After Visit Summary given.  SUBJECTIVE: History from: patient. Melissa Pham is a 58 y.o. female who reports pain of distal left 5th finger; x 1 year. Has been placed in finger splint in the past but reports her dog recently ate splint;  requests another. "Finger is still crooked though". No extremity sensation changes or weakness. Tender at times.  Past Surgical History:  Procedure Laterality Date   CERVICAL BIOPSY  W/ LOOP ELECTRODE EXCISION     left eye surgery  1970   TUBAL LIGATION  1989    Increased blood pressure noted today. Reports that she is treated for HTN. No symptoms.  OBJECTIVE:  Vitals:   02/26/24 1302  BP: (!) 160/86  Pulse: 92  Resp: 16  Temp: 98 F (36.7 C)  TempSrc: Oral  SpO2: 94%    General appearance: alert; no distress Extremities: LUE: warm with well perfused appearance; mild flexion deformity of distal left 5th finger; no swelling; limited distal ROM CV: brisk extremity capillary refill of LUE; 2+ radial pulse of LUE. Skin: warm and dry; no visible rashes Neurologic: normal sensation of LUE Psychological: alert and cooperative; normal mood and affect  Imaging: DG Finger Little Left Result Date: 02/26/2024 CLINICAL DATA:  Chronic left fifth finger pain. EXAM: LEFT FINGER(S) - 2+ VIEW COMPARISON:  None Available. FINDINGS: There is no evidence of fracture or dislocation. There is significantly increased erosive change involving the fifth distal interphalangeal joint consistent with progressive erosive osteoarthritis. Soft tissues are unremarkable. IMPRESSION: Significantly increased erosive changes are seen involving the fifth distal interphalangeal joint consistent with progressive erosive osteoarthritis. Electronically Signed   By: Irving Mantle.D.  On: 02/26/2024 13:57      Allergies  Allergen Reactions   Sulfa Antibiotics Anaphylaxis    Past Medical History:  Diagnosis Date   ADHD (attention deficit hyperactivity disorder)    Anxiety    Asthma    Bipolar disorder (HCC)    Blood transfusion without reported diagnosis 11/05/1983   after childbirth   Coronary artery disease    Depression    Diabetes mellitus without complication (HCC)    GERD (gastroesophageal reflux  disease)    History of illicit drug use    Hyperlipidemia    Hypertension    Schizophrenia (HCC)    Sickle cell trait (HCC)    Tobacco use    Social History   Socioeconomic History   Marital status: Single    Spouse name: Not on file   Number of children: Not on file   Years of education: Not on file   Highest education level: 12th grade  Occupational History   Not on file  Tobacco Use   Smoking status: Every Day    Current packs/day: 0.25    Average packs/day: 0.3 packs/day for 4.0 years (1.0 ttl pk-yrs)    Types: Cigarettes   Smokeless tobacco: Never  Vaping Use   Vaping status: Never Used  Substance and Sexual Activity   Alcohol use: No    Alcohol/week: 0.0 standard drinks of alcohol   Drug use: No    Comment: no drug use for 7 years   Sexual activity: Not Currently    Birth control/protection: Surgical  Other Topics Concern   Not on file  Social History Narrative   Are you right handed or left handed? Right Handed    Are you currently employed ? Yes   What is your current occupation?   Do you live at home alone? No   Who lives with you? Just her dog    What type of home do you live in: 1 story or 2 story? Two story home           Daughter got killed. Hit by a bus.   Social Drivers of Corporate investment banker Strain: Low Risk  (01/07/2024)   Overall Financial Resource Strain (CARDIA)    Difficulty of Paying Living Expenses: Not very hard  Food Insecurity: No Food Insecurity (01/07/2024)   Hunger Vital Sign    Worried About Running Out of Food in the Last Year: Never true    Ran Out of Food in the Last Year: Never true  Transportation Needs: Unmet Transportation Needs (01/07/2024)   PRAPARE - Administrator, Civil Service (Medical): Yes    Lack of Transportation (Non-Medical): Yes  Physical Activity: Insufficiently Active (01/07/2024)   Exercise Vital Sign    Days of Exercise per Week: 3 days    Minutes of Exercise per Session: 30 min  Stress:  Stress Concern Present (01/07/2024)   [employer] of Occupational Health - Occupational Stress Questionnaire    Feeling of Stress : To some extent  Social Connections: Moderately Integrated (01/07/2024)   Social Connection and Isolation Panel [NHANES]    Frequency of Communication with Friends and Family: More than three times a week    Frequency of Social Gatherings with Friends and Family: Three times a week    Attends Religious Services: More than 4 times per year    Active Member of Clubs or Organizations: Yes    Attends Banker Meetings: More than 4 times per year  Marital Status: Never married   Family History  Problem Relation Age of Onset   Hyperlipidemia Mother    Arthritis Mother    Parkinson's disease Mother    Heart attack Father    Diabetes Father    Hypertension Father    Hyperlipidemia Father    Cancer Sister        cervical   Breast cancer Sister 32   Breast cancer Sister    Colon cancer Maternal Grandmother 15   Breast cancer Paternal Aunt    Kidney disease Other    Stroke Other    Pancreatic disease Other    Sickle cell anemia Other    Esophageal cancer Neg Hx    Rectal cancer Neg Hx    Past Surgical History:  Procedure Laterality Date   CERVICAL BIOPSY  W/ LOOP ELECTRODE EXCISION     left eye surgery  1970   TUBAL LIGATION  1989       Afton Albright, MD 02/26/24 1449    Afton Albright, MD 02/26/24 1450

## 2024-03-02 ENCOUNTER — Telehealth (HOSPITAL_COMMUNITY): Payer: Self-pay | Admitting: *Deleted

## 2024-03-02 NOTE — Telephone Encounter (Signed)
Attempted to call patient regarding upcoming cardiac PET appointment. Left message on voicemail with name and callback number  Larey Brick RN Navigator Cardiac Imaging [facility] [phone] Office [phone] Cell  Reminder to avoid caffeine 12 hours prior to her cardiac PET appt.

## 2024-03-03 ENCOUNTER — Ambulatory Visit (HOSPITAL_COMMUNITY): Admission: RE | Admit: 2024-03-03 | Source: Ambulatory Visit

## 2024-03-29 ENCOUNTER — Other Ambulatory Visit: Payer: Self-pay | Admitting: Nurse Practitioner

## 2024-03-29 ENCOUNTER — Encounter (HOSPITAL_COMMUNITY): Payer: Self-pay

## 2024-03-29 ENCOUNTER — Ambulatory Visit (HOSPITAL_COMMUNITY)
Admission: EM | Admit: 2024-03-29 | Discharge: 2024-03-29 | Disposition: A | Discharge status: Home / Self Care | Attending: Family Medicine | Admitting: Family Medicine

## 2024-03-29 DIAGNOSIS — H60501 Unspecified acute noninfective otitis externa, right ear: Secondary | ICD-10-CM | POA: Diagnosis not present

## 2024-03-29 DIAGNOSIS — R350 Frequency of micturition: Secondary | ICD-10-CM | POA: Insufficient documentation

## 2024-03-29 DIAGNOSIS — J4520 Mild intermittent asthma, uncomplicated: Secondary | ICD-10-CM

## 2024-03-29 DIAGNOSIS — M545 Low back pain, unspecified: Secondary | ICD-10-CM | POA: Diagnosis not present

## 2024-03-29 LAB — POCT URINALYSIS DIP (MANUAL ENTRY)
Bilirubin, UA: NEGATIVE
Glucose, UA: NEGATIVE mg/dL
Ketones, POC UA: NEGATIVE mg/dL
Leukocytes, UA: NEGATIVE
Nitrite, UA: NEGATIVE
Protein Ur, POC: NEGATIVE mg/dL
Spec Grav, UA: 1.02 (ref 1.010–1.025)
Urobilinogen, UA: 0.2 U/dL
pH, UA: 5.5 (ref 5.0–8.0)

## 2024-03-29 MED ORDER — KETOROLAC TROMETHAMINE 10 MG PO TABS: 10.0000 mg | ORAL_TABLET | Freq: Four times a day (QID) | ORAL | 0 refills | Status: AC | PRN

## 2024-03-29 MED ORDER — KETOROLAC TROMETHAMINE 30 MG/ML IJ SOLN
30.0000 mg | Freq: Once | INTRAMUSCULAR | Status: AC
  Administered 2024-03-29: 30 mg via INTRAMUSCULAR

## 2024-03-29 MED ORDER — KETOROLAC TROMETHAMINE 30 MG/ML IJ SOLN
INTRAMUSCULAR | Status: AC
  Filled 2024-03-29: qty 1

## 2024-03-29 MED ORDER — OFLOXACIN 0.3 % OT SOLN: 5.0000 [drp] | Freq: Two times a day (BID) | OTIC | 0 refills | Status: AC

## 2024-03-29 NOTE — ED Triage Notes (Signed)
 Pt states right ear pain and right lower back/flank pain that radiates down her right leg to her foot. Pt also having urinary frequency.

## 2024-03-29 NOTE — ED Provider Notes (Signed)
 [facility]    CSN: [id number] Arrival date & time: 03/29/24  0841      History   Chief Complaint Chief Complaint  Patient presents with   Dysuria    HPI Melissa Pham is a 58 y.o. female.    Dysuria Here for sore throat and right ear pain.  That started about 6 days ago.  No fever or chills.  No congestion  She has also had some right lumbar pain.  It is worse with bending or lying down or twisting at her torso.  It may radiate down her right leg.  No dysuria.  No hematuria.  She is having urinary frequency.  Past medical history significant for diabetes, and her last A1c was 6.4.  She is working on diet and exercise and is losing some weight.  She does not check her sugar at home  She is allergic to sulfa antibiotics.    Past Medical History:  Diagnosis Date   ADHD (attention deficit hyperactivity disorder)    Anxiety    Asthma    Bipolar disorder (HCC)    Blood transfusion without reported diagnosis 11/05/1983   after childbirth   Coronary artery disease    Depression    Diabetes mellitus without complication (HCC)    GERD (gastroesophageal reflux disease)    History of illicit drug use    Hyperlipidemia    Hypertension    Schizophrenia (HCC)    Sickle cell trait (HCC)    Tobacco use     Patient Active Problem List   Diagnosis Date Noted   Syncope 12/06/2018   CAD in native artery    Tobacco abuse 07/27/2018   Pure hypercholesterolemia    Generalized abdominal discomfort 12/10/2017   Effusion, right knee 03/13/2017   Sprain of right knee 03/13/2017   Chest pain 02/20/2015   Suprapubic pain 02/20/2015   Diarrhea 02/20/2015   Diabetes mellitus, type 2 (HCC) 02/20/2015   HTN (hypertension) 02/20/2015   Dyslipidemia 02/20/2015   Bipolar affective disorder (HCC) 02/20/2015   Schizophrenia (HCC) 02/20/2015   Hypokalemia 02/20/2015    Past Surgical History:  Procedure Laterality Date   CERVICAL BIOPSY  W/ LOOP ELECTRODE EXCISION      left eye surgery  1970   TUBAL LIGATION  1989    OB History     Gravida  7   Para      Term      Preterm      AB  3   Living  4      SAB  1   IAB  2   Ectopic      Multiple      Live Births               Home Medications    Prior to Admission medications   Medication Sig Start Date End Date Taking? Authorizing Provider  ketorolac  (TORADOL ) 10 MG tablet Take 1 tablet (10 mg total) by mouth every 6 (six) hours as needed (pain). 03/29/24  Yes Jyles Sontag K, MD  ofloxacin (FLOXIN) 0.3 % OTIC solution Place 5 drops into both ears 2 (two) times daily for 7 days. 03/29/24 04/05/24 Yes Ann Keto, MD  albuterol  (VENTOLIN  HFA) 108 (90 Base) MCG/ACT inhaler Inhale 2 puffs into the lungs every 6 (six) hours as needed for wheezing or shortness of breath. 01/07/24   Fleming, Zelda W, NP  aspirin  EC 81 MG tablet Take 81 mg by mouth daily.    [provider]  fluticasone  furoate-vilanterol (BREO ELLIPTA ) 100-25 MCG/ACT AEPB Inhale 1 puff into the lungs daily. 02/10/24   Collins Dean, NP  gabapentin  (NEURONTIN ) 300 MG capsule Take 1 capsule (300 mg total) by mouth at bedtime. 01/07/24   Fleming, Zelda W, NP  glimepiride  (AMARYL ) 2 MG tablet Take 1 tablet (2 mg total) by mouth daily with breakfast. 01/07/24   Fleming, Zelda W, NP  glucose blood (ACCU-CHEK GUIDE) test strip Use as instructed. Check blood glucose by fingerstick twice per day. 09/17/22   Fleming, Zelda W, NP  lisinopril  (ZESTRIL ) 20 MG tablet Take 1 tablet (20 mg total) by mouth daily. 01/07/24   Fleming, Zelda W, NP  loperamide  (IMODIUM ) 2 MG capsule Take 1 capsule (2 mg total) by mouth 4 (four) times daily as needed for diarrhea or loose stools. Patient not taking: Reported on 01/30/2024 07/22/23   Levora Reas A, NP  nitroGLYCERIN  (NITROSTAT ) 0.4 MG SL tablet Place 1 tablet (0.4 mg total) under the tongue every 5 (five) minutes as needed for chest pain. 09/24/23 12/23/23  Lamond Pilot, PA   pantoprazole  (PROTONIX ) 40 MG tablet Take 1 tablet (40 mg total) by mouth daily. 01/04/24   Ballard Bongo, MD  PARoxetine  (PAXIL ) 20 MG tablet Take 1 tablet by mouth once daily Patient not taking: Reported on 01/30/2024 04/07/23   Fleming, Zelda W, NP  Semaglutide  (RYBELSUS ) 3 MG TABS Take 1 tablet (3 mg total) by mouth daily. 02/02/24   Fleming, Zelda W, NP  simvastatin  (ZOCOR ) 40 MG tablet Take 1 tablet (40 mg total) by mouth daily. 01/07/24   Fleming, Zelda W, NP  sucralfate  (CARAFATE ) 1 g tablet Take 1 tablet (1 g total) by mouth 4 (four) times daily -  with meals and at bedtime. 01/04/24   Ballard Bongo, MD  triamcinolone  cream (KENALOG ) 0.1 % Apply 1 Application topically 2 (two) times daily. Patient not taking: Reported on 01/30/2024 08/26/23   Starlene Eaton, FNP  valACYclovir  (VALTREX ) 1000 MG tablet TAKE 1 TABLET BY MOUTH THREE TIMES DAILY 04/07/23   Collins Dean, NP    Family History Family History  Problem Relation Age of Onset   Hyperlipidemia Mother    Arthritis Mother    Parkinson's disease Mother    Heart attack Father    Diabetes Father    Hypertension Father    Hyperlipidemia Father    Cancer Sister        cervical   Breast cancer Sister 61   Breast cancer Sister    Colon cancer Maternal Grandmother 27   Breast cancer Paternal Aunt    Kidney disease Other    Stroke Other    Pancreatic disease Other    Sickle cell anemia Other    Esophageal cancer Neg Hx    Rectal cancer Neg Hx     Social History Social History   Tobacco Use   Smoking status: Every Day    Current packs/day: 0.25    Average packs/day: 0.3 packs/day for 4.0 years (1.0 ttl pk-yrs)    Types: Cigarettes   Smokeless tobacco: Never  Vaping Use   Vaping status: Never Used  Substance Use Topics   Alcohol use: No    Alcohol/week: 0.0 standard drinks of alcohol   Drug use: No    Comment: no drug use for 7 years     Allergies   Sulfa antibiotics   Review of  Systems Review of Systems  Genitourinary:  Positive for dysuria.  Physical Exam Triage Vital Signs ED Triage Vitals  Encounter Vitals Group     BP 03/29/24 0901 (!) 148/83     Systolic BP Percentile --      Diastolic BP Percentile --      Pulse Rate 03/29/24 0901 96     Resp 03/29/24 0901 18     Temp 03/29/24 0901 98.1 F (36.7 C)     Temp Source 03/29/24 0901 Oral     SpO2 03/29/24 0901 95 %     Weight --      Height --      Head Circumference --      Peak Flow --      Pain Score 03/29/24 0908 0     Pain Loc --      Pain Education --      Exclude from Growth Chart --    No data found.  Updated Vital Signs BP (!) 148/83 (BP Location: Left Arm)   Pulse 96   Temp 98.1 F (36.7 C) (Oral)   Resp 18   LMP 07/21/2013   SpO2 95%   Visual Acuity Right Eye Distance:   Left Eye Distance:   Bilateral Distance:    Right Eye Near:   Left Eye Near:    Bilateral Near:     Physical Exam Vitals reviewed.  Constitutional:      General: She is not in acute distress.    Appearance: She is not toxic-appearing.  HENT:     Left Ear: Tympanic membrane and ear canal normal.     Ears:     Comments: Right tympanic membrane is gray and shiny.  There may be mild amount of swelling in the canal on the right.    Nose: Nose normal.     Mouth/Throat:     Mouth: Mucous membranes are moist.     Pharynx: No oropharyngeal exudate or posterior oropharyngeal erythema.  Eyes:     Extraocular Movements: Extraocular movements intact.     Conjunctiva/sclera: Conjunctivae normal.     Pupils: Pupils are equal, round, and reactive to light.  Cardiovascular:     Rate and Rhythm: Normal rate and regular rhythm.     Heart sounds: No murmur heard. Pulmonary:     Effort: Pulmonary effort is normal. No respiratory distress.     Breath sounds: No stridor. No wheezing, rhonchi or rales.  Abdominal:     Tenderness: There is no right CVA tenderness or left CVA tenderness (there is tenderness to  palpation with fingers, but not CVAT).  Musculoskeletal:     Cervical back: Neck supple.  Lymphadenopathy:     Cervical: No cervical adenopathy.  Skin:    Capillary Refill: Capillary refill takes less than 2 seconds.     Coloration: Skin is not jaundiced or pale.  Neurological:     General: No focal deficit present.     Mental Status: She is alert and oriented to person, place, and time.  Psychiatric:        Behavior: Behavior normal.      UC Treatments / Results  Labs (all labs ordered are listed, but only abnormal results are displayed) Labs Reviewed  POCT URINALYSIS DIP (MANUAL ENTRY) - Abnormal; Notable for the following components:      Result Value   Blood, UA trace-intact (*)    All other components within normal limits  URINE CULTURE    EKG   Radiology No results found.  Procedures Procedures (including critical care time)  Medications Ordered in UC Medications  ketorolac  (TORADOL ) 30 MG/ML injection 30 mg (has no administration in time range)    Initial Impression / Assessment and Plan / UC Course  I have reviewed the triage vital signs and the nursing notes.  Pertinent labs & imaging results that were available during my care of the patient were reviewed by me and considered in my medical decision making (see chart for details).     I suspect allergies versus a viral illness for the throat symptoms.  I will go ahead and send in Floxin for possible otitis externa.  Urinalysis shows only a trace of intact blood.  No leuks or nitrites.  Urine culture is sent.  We will notify her if it is positive for UTI.  There is no glucose on the urinalysis either.  Toradol  injections given here and Toradol  tablets are sent to the pharmacy.  Last EGFR was greater than 60 in March of this year. Final Clinical Impressions(s) / UC Diagnoses   Final diagnoses:  Urinary frequency  Acute otitis externa of right ear, unspecified type  Acute right-sided low back pain,  unspecified whether sciatica present     Discharge Instructions      The urinalysis had a tiny amount of red blood cells, which could be normal.  There was no sugar and no white blood cells or nitrites.   You have been given a shot of Toradol  30 mg today.  Ketorolac  10 mg tablets--take 1 tablet every 6 hours as needed for pain.  This is the same medicine that is in the shot we just gave you  -Floxin eardrops-5 drops in the affected ear 2 times daily for 7 days.  Urine culture is sent, and staff will notify you that it that there is a urinary infection that needs treating.  Please follow-up with your primary care     ED Prescriptions     Medication Sig Dispense Auth. Provider   ofloxacin (FLOXIN) 0.3 % OTIC solution Place 5 drops into both ears 2 (two) times daily for 7 days. 5 mL Ann Keto, MD   ketorolac  (TORADOL ) 10 MG tablet Take 1 tablet (10 mg total) by mouth every 6 (six) hours as needed (pain). 20 tablet Kirstyn Lean K, MD      PDMP not reviewed this encounter.   Ann Keto, MD 03/29/24 0930

## 2024-03-29 NOTE — Discharge Instructions (Addendum)
 The urinalysis had a tiny amount of red blood cells, which could be normal.  There was no sugar and no white blood cells or nitrites.   You have been given a shot of Toradol  30 mg today.  Ketorolac  10 mg tablets--take 1 tablet every 6 hours as needed for pain.  This is the same medicine that is in the shot we just gave you  -Floxin eardrops-5 drops in the affected ear 2 times daily for 7 days.  Urine culture is sent, and staff will notify you that it that there is a urinary infection that needs treating.  Please follow-up with your primary care

## 2024-03-30 ENCOUNTER — Ambulatory Visit (HOSPITAL_COMMUNITY): Payer: Self-pay

## 2024-03-30 LAB — URINE CULTURE: Culture: NO GROWTH

## 2024-03-31 ENCOUNTER — Telehealth: Payer: Self-pay | Admitting: Nurse Practitioner

## 2024-03-31 DIAGNOSIS — F4310 Post-traumatic stress disorder, unspecified: Secondary | ICD-10-CM

## 2024-03-31 DIAGNOSIS — F411 Generalized anxiety disorder: Secondary | ICD-10-CM

## 2024-03-31 NOTE — Telephone Encounter (Unsigned)
 Copied from CRM [id number]. Topic: Clinical - Medication Refill >> Mar 31, 2024 11:25 AM Star [place] wrote: Medication: PARoxetine  (PAXIL ) 20 MG tablet  Has the patient contacted their pharmacy? No (Agent: If no, request that the patient contact the pharmacy for the refill. If patient does not wish to contact the pharmacy document the reason why and proceed with request.) (Agent: If yes, when and what did the pharmacy advise?)  This is the patient's preferred pharmacy:  [facility] [address] [place] - [address] [place] [address] Phone: [phone] Fax: [phone]  Is this the correct pharmacy for this prescription? Yes If no, delete pharmacy and type the correct one.   Has the prescription been filled recently? No  Is the patient out of the medication? Yes  Has the patient been seen for an appointment in the last year OR does the patient have an upcoming appointment? Yes  Can we respond through MyChart? Yes  Agent: Please be advised that Rx refills may take up to 3 business days. We ask that you follow-up with your pharmacy.

## 2024-04-02 MED ORDER — PAROXETINE HCL 20 MG PO TABS: 20.0000 mg | ORAL_TABLET | Freq: Every day | ORAL | 1 refills | Status: AC

## 2024-04-02 NOTE — Telephone Encounter (Signed)
 Requested Prescriptions  Pending Prescriptions Disp Refills   PARoxetine  (PAXIL ) 20 MG tablet 90 tablet 0    Sig: Take 1 tablet (20 mg total) by mouth daily.     Psychiatry:  Antidepressants - SSRI Passed - 04/02/2024 12:20 PM      Passed - Valid encounter within last 6 months    Recent Outpatient Visits           2 months ago Diabetes mellitus treated with oral medication (HCC)   [facility] [place] Collins Dean, NP   2 months ago Primary hypertension   [facility] [place] Collins Dean, NP   1 year ago Mild intermittent asthma without complication   [facility] [place]. [facility] Collins Dean, NP   1 year ago Encounter for [employer] annual wellness exam   [facility] Of [place]. [facility] Collins Dean, NP   1 year ago Encounter for Papanicolaou smear for cervical cancer screening    [facility] Collins Dean, [place]

## 2024-04-05 ENCOUNTER — Inpatient Hospital Stay: Admission: RE | Admit: 2024-04-05 | Payer: 59 | Source: Ambulatory Visit

## 2024-04-09 DIAGNOSIS — H524 Presbyopia: Secondary | ICD-10-CM | POA: Diagnosis not present

## 2024-04-09 DIAGNOSIS — E119 Type 2 diabetes mellitus without complications: Secondary | ICD-10-CM | POA: Diagnosis not present

## 2024-04-13 ENCOUNTER — Other Ambulatory Visit: Payer: Self-pay | Admitting: Nurse Practitioner

## 2024-04-13 DIAGNOSIS — E119 Type 2 diabetes mellitus without complications: Secondary | ICD-10-CM

## 2024-04-13 DIAGNOSIS — E7800 Pure hypercholesterolemia, unspecified: Secondary | ICD-10-CM

## 2024-04-13 DIAGNOSIS — I10 Essential (primary) hypertension: Secondary | ICD-10-CM

## 2024-05-11 ENCOUNTER — Encounter: Admitting: Family Medicine

## 2024-06-03 ENCOUNTER — Encounter (HOSPITAL_COMMUNITY): Payer: Self-pay

## 2024-06-03 ENCOUNTER — Emergency Department (HOSPITAL_COMMUNITY)

## 2024-06-03 ENCOUNTER — Other Ambulatory Visit: Payer: Self-pay

## 2024-06-03 ENCOUNTER — Emergency Department (HOSPITAL_COMMUNITY)
Admission: EM | Admit: 2024-06-03 | Discharge: 2024-06-04 | Disposition: A | Discharge status: Home / Self Care | Attending: Emergency Medicine | Admitting: Emergency Medicine

## 2024-06-03 DIAGNOSIS — Z7984 Long term (current) use of oral hypoglycemic drugs: Secondary | ICD-10-CM | POA: Insufficient documentation

## 2024-06-03 DIAGNOSIS — E119 Type 2 diabetes mellitus without complications: Secondary | ICD-10-CM | POA: Diagnosis not present

## 2024-06-03 DIAGNOSIS — I10 Essential (primary) hypertension: Secondary | ICD-10-CM | POA: Insufficient documentation

## 2024-06-03 DIAGNOSIS — F17200 Nicotine dependence, unspecified, uncomplicated: Secondary | ICD-10-CM | POA: Insufficient documentation

## 2024-06-03 DIAGNOSIS — R0789 Other chest pain: Secondary | ICD-10-CM | POA: Diagnosis present

## 2024-06-03 DIAGNOSIS — E876 Hypokalemia: Secondary | ICD-10-CM | POA: Insufficient documentation

## 2024-06-03 DIAGNOSIS — Z79899 Other long term (current) drug therapy: Secondary | ICD-10-CM | POA: Diagnosis not present

## 2024-06-03 DIAGNOSIS — I2510 Atherosclerotic heart disease of native coronary artery without angina pectoris: Secondary | ICD-10-CM | POA: Insufficient documentation

## 2024-06-03 DIAGNOSIS — Z7982 Long term (current) use of aspirin: Secondary | ICD-10-CM | POA: Insufficient documentation

## 2024-06-03 DIAGNOSIS — R079 Chest pain, unspecified: Secondary | ICD-10-CM

## 2024-06-03 LAB — COMPREHENSIVE METABOLIC PANEL WITH GFR
ALT: 32 U/L (ref 0–44)
AST: 37 U/L (ref 15–41)
Albumin: 3.7 g/dL (ref 3.5–5.0)
Alkaline Phosphatase: 66 U/L (ref 38–126)
Anion gap: 11 (ref 5–15)
BUN: 7 mg/dL (ref 6–20)
CO2: 22 mmol/L (ref 22–32)
Calcium: 9.2 mg/dL (ref 8.9–10.3)
Chloride: 107 mmol/L (ref 98–111)
Creatinine, Ser: 0.61 mg/dL (ref 0.44–1.00)
GFR, Estimated: >60 mL/min (ref >60)
Glucose, Bld: 114 mg/dL — ABNORMAL HIGH (ref 70–99)
Potassium: 3.2 mmol/L — ABNORMAL LOW (ref 3.5–5.1)
Sodium: 140 mmol/L (ref 135–145)
Total Bilirubin: 0.5 mg/dL (ref 0.0–1.2)
Total Protein: 7.2 g/dL (ref 6.5–8.1)

## 2024-06-03 LAB — TROPONIN I (HIGH SENSITIVITY): Troponin I (High Sensitivity): 6 ng/L (ref <18)

## 2024-06-03 LAB — CBC
HCT: 37.2 % (ref 36.0–46.0)
Hemoglobin: 12.5 g/dL (ref 12.0–15.0)
MCH: 28.8 pg (ref 26.0–34.0)
MCHC: 33.6 g/dL (ref 30.0–36.0)
MCV: 85.7 fL (ref 80.0–100.0)
Platelets: 303 K/uL (ref 150–400)
RBC: 4.34 MIL/uL (ref 3.87–5.11)
RDW: 14 % (ref 11.5–15.5)
WBC: 7.3 K/uL (ref 4.0–10.5)
nRBC: 0 % (ref 0.0–0.2)

## 2024-06-03 NOTE — ED Triage Notes (Signed)
 Left chest / axilla pain that manifested just prior to arrival. Says she was singing in the church choir.   Took 162mg  ASA, and 1 n.itro prior to arrival with improvement.   Also describes left arm pain and generalized cramping.

## 2024-06-04 ENCOUNTER — Other Ambulatory Visit: Payer: Self-pay | Admitting: Nurse Practitioner

## 2024-06-04 DIAGNOSIS — R0789 Other chest pain: Secondary | ICD-10-CM | POA: Diagnosis not present

## 2024-06-04 DIAGNOSIS — E119 Type 2 diabetes mellitus without complications: Secondary | ICD-10-CM

## 2024-06-04 LAB — TROPONIN I (HIGH SENSITIVITY): Troponin I (High Sensitivity): 6 ng/L (ref <18)

## 2024-06-04 MED ORDER — POTASSIUM CHLORIDE CRYS ER 20 MEQ PO TBCR
40.0000 meq | EXTENDED_RELEASE_TABLET | Freq: Once | ORAL | Status: AC
  Administered 2024-06-04: 40 meq via ORAL
  Filled 2024-06-04: qty 2

## 2024-06-04 NOTE — ED Provider Notes (Signed)
 [place] EMERGENCY DEPARTMENT AT [facility] Provider Note   CSN: [id number] Arrival date & time: 06/03/24  2056     Patient presents with: Chest Pain   Melissa Pham is a 58 y.o. female.  Patient presents to the emergency department with complaints of left-sided chest discomfort described as a cramp or gas.  She states pain started at around 8 PM this evening while at church.  She was singing in the choir at the time.  She states she took 2 baby aspirin  and 1 dose of her nitroglycerin  prior to arrival.  She does endorse some mild left-sided arm pain and some cramping.  She denies nausea, vomiting, shortness of breath, chest pain at this time.  She states that she tried to go to the bathroom and burp to improve her symptoms but felt like the symptoms stayed roughly the same.  She has past medical history significant type II DM, hypertension, schizophrenia, bipolar affective disorder, CAD, GERD    Chest Pain      Prior to Admission medications   Medication Sig Start Date End Date Taking? Authorizing Provider  albuterol  (PROVENTIL ) (2.5 MG/3ML) 0.083% nebulizer solution USE 1 VIAL IN NEBULIZER EVERY 6 HOURS AS NEEDED FOR WHEEZING FOR SHORTNESS OF BREATH 03/30/24   Newlin, Enobong, MD  albuterol  (VENTOLIN  HFA) 108 (90 Base) MCG/ACT inhaler INHALE 2 PUFFS INTO LUNGS EVERY 6 HOURS AS NEEDED FOR WHEEZING FOR SHORTNESS OF BREATH 03/30/24   Newlin, Enobong, MD  aspirin  EC 81 MG tablet Take 81 mg by mouth daily.    [provider]  fluticasone  furoate-vilanterol (BREO ELLIPTA ) 100-25 MCG/ACT AEPB Inhale 1 puff into the lungs daily. 02/10/24   Fleming, Zelda W, NP  gabapentin  (NEURONTIN ) 300 MG capsule Take 1 capsule (300 mg total) by mouth at bedtime. 01/07/24   Fleming, Zelda W, NP  glimepiride  (AMARYL ) 2 MG tablet Take 1 tablet (2 mg total) by mouth daily with breakfast. Please schedule PCP appointment for more refills. 04/14/24   Newlin, Enobong, MD  glucose blood (ACCU-CHEK  GUIDE) test strip Use as instructed. Check blood glucose by fingerstick twice per day. 09/17/22   Fleming, Zelda W, NP  ketorolac  (TORADOL ) 10 MG tablet Take 1 tablet (10 mg total) by mouth every 6 (six) hours as needed (pain). 03/29/24   Vonna Sharlet POUR, MD  lisinopril  (ZESTRIL ) 20 MG tablet Take 1 tablet (20 mg total) by mouth daily. Please schedule PCP appointment for more refills. 04/14/24   Newlin, Enobong, MD  loperamide  (IMODIUM ) 2 MG capsule Take 1 capsule (2 mg total) by mouth 4 (four) times daily as needed for diarrhea or loose stools. Patient not taking: Reported on 01/30/2024 07/22/23   Johnie Flaming A, NP  nitroGLYCERIN  (NITROSTAT ) 0.4 MG SL tablet Place 1 tablet (0.4 mg total) under the tongue every 5 (five) minutes as needed for chest pain. 09/24/23 12/23/23  Madie Jon Garre, PA  pantoprazole  (PROTONIX ) 40 MG tablet Take 1 tablet (40 mg total) by mouth daily. 01/04/24   Haze Lonni PARAS, MD  PARoxetine  (PAXIL ) 20 MG tablet Take 1 tablet (20 mg total) by mouth daily. 04/02/24   Fleming, Zelda W, NP  Semaglutide  (RYBELSUS ) 3 MG TABS Take 1 tablet (3 mg total) by mouth daily. 02/02/24   Fleming, Zelda W, NP  simvastatin  (ZOCOR ) 40 MG tablet Take 1 tablet (40 mg total) by mouth daily. Please schedule PCP appointment for more refills. 04/14/24   Newlin, Enobong, MD  sucralfate  (CARAFATE ) 1 g tablet Take 1  tablet (1 g total) by mouth 4 (four) times daily -  with meals and at bedtime. 01/04/24   Haze Lonni PARAS, MD  triamcinolone  cream (KENALOG ) 0.1 % Apply 1 Application topically 2 (two) times daily. Patient not taking: Reported on 01/30/2024 08/26/23   Enedelia Dorna HERO, FNP  valACYclovir  (VALTREX ) 1000 MG tablet TAKE 1 TABLET BY MOUTH THREE TIMES DAILY 04/07/23   Fleming, Zelda W, NP    Allergies: Sulfa antibiotics    Review of Systems  Cardiovascular:  Positive for chest pain.    Updated Vital Signs BP (!) 160/100 (BP Location: Right Arm)   Pulse (!) 107   Temp 98.1  F (36.7 C) (Oral)   Resp 20   Ht 5' 1 (1.549 m)   Wt 88.5 kg   LMP 07/21/2013   SpO2 100%   BMI 36.84 kg/m   Physical Exam Vitals and nursing note reviewed.  Constitutional:      General: She is not in acute distress.    Appearance: She is well-developed.  HENT:     Head: Normocephalic and atraumatic.  Eyes:     Conjunctiva/sclera: Conjunctivae normal.  Cardiovascular:     Rate and Rhythm: Normal rate and regular rhythm.     Heart sounds: No murmur heard. Pulmonary:     Effort: Pulmonary effort is normal. No respiratory distress.     Breath sounds: Normal breath sounds.  Chest:     Chest wall: No tenderness.  Abdominal:     Palpations: Abdomen is soft.     Tenderness: There is no abdominal tenderness.  Musculoskeletal:        General: No swelling.     Cervical back: Neck supple.  Skin:    General: Skin is warm and dry.     Capillary Refill: Capillary refill takes less than 2 seconds.  Neurological:     Mental Status: She is alert.  Psychiatric:        Mood and Affect: Mood normal.     (all labs ordered are listed, but only abnormal results are displayed) Labs Reviewed  COMPREHENSIVE METABOLIC PANEL WITH GFR - Abnormal; Notable for the following components:      Result Value   Potassium 3.2 (*)    Glucose, Bld 114 (*)    All other components within normal limits  CBC  TROPONIN I (HIGH SENSITIVITY)  TROPONIN I (HIGH SENSITIVITY)    EKG: None  Radiology: DG Chest 2 View Result Date: 06/03/2024 CLINICAL DATA:  Chest pain EXAM: CHEST - 2 VIEW COMPARISON:  Chest radiograph and CTA chest dated 01/04/2024 FINDINGS: Normal lung volumes. No focal consolidations. Nodular focus projecting over the lateral right lower lung may correspond to known calcification within the right breast. No pleural effusion or pneumothorax. The heart size and mediastinal contours are within normal limits. No acute osseous abnormality. IMPRESSION: No active cardiopulmonary disease.  Electronically Signed   By: Limin  Xu M.D.   On: 06/03/2024 21:50     Procedures   Medications Ordered in the ED  potassium chloride  SA (KLOR-CON  M) CR tablet 40 mEq (has no administration in time range)                                    Medical Decision Making Amount and/or Complexity of Data Reviewed Labs: ordered. Radiology: ordered.   This patient presents to the ED for concern of chest pain, this involves an extensive  number of treatment options, and is a complaint that carries with it a high risk of complications and morbidity.  The differential diagnosis includes musculoskeletal pain, GERD, ACS, pneumonia, PE, others   Co morbidities / Chronic conditions that complicate the patient evaluation  Hypertension, GERD, CAD   Additional history obtained:  Additional history obtained from EMR   Lab Tests:  I Ordered, and personally interpreted labs.  The pertinent results include: Initial and repeat troponin 6, mild hypokalemia with a potassium of 3.2, unremarkable CBC   Imaging Studies ordered:  I ordered imaging studies including chest x-ray I independently visualized and interpreted imaging which showed no active disease I agree with the radiologist interpretation   Cardiac Monitoring: / EKG:  The patient was maintained on a cardiac monitor.  I personally viewed and interpreted the cardiac monitored which showed an underlying rhythm of: Normal sinus rhythm   Problem List / ED Course / Critical interventions / Medication management   I ordered medication including potassium Reevaluation of the patient after these medicines showed that the patient stayed the same   Social Determinants of Health:  Patient is a daily tobacco smoker   Test / Admission - Considered:  Patient feels better at this time.  She is denying shortness of breath or chest pain.  Symptoms do not seem consistent with a pulmonary embolism with no chest pain or shortness of breath.   Oxygen saturations are 100% on room air.  She feels like this may have been a cramp versus a gas pain.  She does have a history of GERD.  She has negative troponins x 2 and a nonischemic EKG showing no signs of ACS.  Her chest x-ray is unremarkable.  She states she is ready to go home and I see no indication for further emergent workup or admission.  Patient plans to follow-up with her cardiologist for further evaluation as needed.      Final diagnoses:  Chest pain, unspecified type    ED Discharge Orders     None          Logan Ubaldo KATHEE DEVONNA 06/04/24 0206    Mesner, Selinda, MD 06/04/24 [phone]

## 2024-06-04 NOTE — Discharge Instructions (Addendum)
 Your workup this evening was reassuring.  If you develop shortness of breath or other life threatening symptoms please return to the emergency department.  Please follow-up with your primary care provider and your cardiologist for further outpatient workup as needed.

## 2024-06-15 ENCOUNTER — Encounter

## 2024-06-15 ENCOUNTER — Other Ambulatory Visit

## 2024-06-18 ENCOUNTER — Other Ambulatory Visit: Payer: Self-pay | Admitting: Family Medicine

## 2024-06-18 DIAGNOSIS — J4520 Mild intermittent asthma, uncomplicated: Secondary | ICD-10-CM

## 2024-06-23 ENCOUNTER — Other Ambulatory Visit: Payer: Self-pay | Admitting: Nurse Practitioner

## 2024-06-23 ENCOUNTER — Ambulatory Visit
Admission: RE | Admit: 2024-06-23 | Discharge: 2024-06-23 | Disposition: A | Discharge status: Home / Self Care | Source: Ambulatory Visit | Attending: Nurse Practitioner | Admitting: Nurse Practitioner

## 2024-06-23 DIAGNOSIS — N6310 Unspecified lump in the right breast, unspecified quadrant: Secondary | ICD-10-CM

## 2024-06-23 DIAGNOSIS — R928 Other abnormal and inconclusive findings on diagnostic imaging of breast: Secondary | ICD-10-CM

## 2024-06-24 ENCOUNTER — Ambulatory Visit: Payer: Self-pay | Admitting: Nurse Practitioner

## 2024-07-05 ENCOUNTER — Other Ambulatory Visit: Payer: Self-pay | Admitting: Family Medicine

## 2024-07-06 NOTE — Telephone Encounter (Signed)
 Requested medication (s) are due for refill today: yes  Requested medication (s) are on the active medication list: yes  Last refill:  03/30/24 75 ml  Future visit scheduled: no  Notes to clinic:  MyChart message sent to pt to make appt    Requested Prescriptions  Pending Prescriptions Disp Refills   albuterol  (PROVENTIL ) (2.5 MG/3ML) 0.083% nebulizer solution [Pharmacy Med Name: Albuterol  Sulfate (2.5 MG/3ML) 0.083% Inhalation Nebulization Solution] 75 mL 0    Sig: USE 1 VIAL IN NEBULIZER EVERY 6 HOURS AS NEEDED FOR WHEEZING FOR SHORTNESS OF BREATH . APPOINTMENT REQUIRED FOR FUTURE REFILLS     Pulmonology:  Beta Agonists 2 Failed - 07/06/2024  2:55 PM      Failed - Last BP in normal range    BP Readings from Last 1 Encounters:  06/04/24 (!) 160/100         Passed - Last Heart Rate in normal range    Pulse Readings from Last 1 Encounters:  06/04/24 (!) 107         Passed - Valid encounter within last 12 months    Recent Outpatient Visits           5 months ago Diabetes mellitus treated with oral medication (HCC)   [facility] [place]. [facility] Theotis Haze ORN, NP   6 months ago Primary hypertension   [facility] [place]. [facility] Theotis Haze ORN, NP   1 year ago Mild intermittent asthma without complication   [facility] [place]. [facility] Theotis Haze ORN, NP   1 year ago Encounter for [employer] annual wellness exam   [facility] [place]. [facility] Theotis Haze ORN, NP   1 year ago Encounter for Papanicolaou smear for cervical cancer screening   [facility] [place]. [facility] Theotis Haze ORN, [place]

## 2024-07-07 ENCOUNTER — Other Ambulatory Visit: Payer: Self-pay

## 2024-07-07 ENCOUNTER — Other Ambulatory Visit (HOSPITAL_COMMUNITY): Payer: Self-pay

## 2024-07-07 ENCOUNTER — Ambulatory Visit
Admission: EM | Admit: 2024-07-07 | Discharge: 2024-07-07 | Disposition: A | Discharge status: Home / Self Care | Attending: Nurse Practitioner | Admitting: Nurse Practitioner

## 2024-07-07 DIAGNOSIS — R079 Chest pain, unspecified: Secondary | ICD-10-CM | POA: Diagnosis not present

## 2024-07-07 DIAGNOSIS — K6289 Other specified diseases of anus and rectum: Secondary | ICD-10-CM | POA: Insufficient documentation

## 2024-07-07 DIAGNOSIS — R1030 Lower abdominal pain, unspecified: Secondary | ICD-10-CM | POA: Insufficient documentation

## 2024-07-07 LAB — POCT URINE DIPSTICK
Bilirubin, UA: NEGATIVE
Glucose, UA: NEGATIVE mg/dL
Ketones, POC UA: NEGATIVE mg/dL
Leukocytes, UA: NEGATIVE
Nitrite, UA: NEGATIVE
Protein Ur, POC: 30 mg/dL — AB
Spec Grav, UA: 1.025 (ref 1.010–1.025)
Urobilinogen, UA: 1 U/dL
pH, UA: 6.5 (ref 5.0–8.0)

## 2024-07-07 MED ORDER — KETOROLAC TROMETHAMINE 30 MG/ML IJ SOLN
30.0000 mg | Freq: Once | INTRAMUSCULAR | Status: AC
  Administered 2024-07-07: 30 mg via INTRAMUSCULAR

## 2024-07-07 MED ORDER — HYDROCORTISONE ACETATE 25 MG RE SUPP: 25.0000 mg | Freq: Two times a day (BID) | RECTAL | 0 refills | Status: AC

## 2024-07-07 NOTE — ED Provider Notes (Signed)
 [facility]    CSN: [id number] Arrival date & time: 07/07/24  1639      History   Chief Complaint No chief complaint on file.   HPI Melissa Pham is a 58 y.o. female.   The history is provided by the patient.   Patient presents for complaints of right ear pain and abdominal pain.  She also complains of rectal pain.  Patient states symptoms started over the past 24 hours.  When patient presented, she complained of chest pain, but now states I feel like it is coming up from my stomach.  Patient denies fever, chills, nausea, vomiting, or constipation.  She endorses gas, bloating, and lower abdominal pain.   Patient states that she has been taking Tylenol  Cold and sinus, Alka-Seltzer, Imodium , NyQuil, and DayQuil.  Patient also endorses history of hemorrhoids, states that she has been having rectal pain after bowel movements.  Patient states that she is currently wearing a patch to lose weight, states that she purchased the patch on her own but that the company was supposed to reach out to her PCP, review of her medications do not show the patch.  Patient also with history of coronary artery disease, she denies shortness of breath, or difficulty breathing.Difficult to follow the patient's symptoms as she has a rapid flight of ideas.   Past Medical History:  Diagnosis Date   ADHD (attention deficit hyperactivity disorder)    Anxiety    Asthma    Bipolar disorder (HCC)    Blood transfusion without reported diagnosis 11/05/1983   after childbirth   Coronary artery disease    Depression    Diabetes mellitus without complication (HCC)    GERD (gastroesophageal reflux disease)    History of illicit drug use    Hyperlipidemia    Hypertension    Schizophrenia (HCC)    Sickle cell trait (HCC)    Tobacco use     Patient Active Problem List   Diagnosis Date Noted   Syncope 12/06/2018   CAD in native artery    Tobacco abuse 07/27/2018   Pure hypercholesterolemia     Generalized abdominal discomfort 12/10/2017   Effusion, right knee 03/13/2017   Sprain of right knee 03/13/2017   Chest pain 02/20/2015   Suprapubic pain 02/20/2015   Diarrhea 02/20/2015   Diabetes mellitus, type 2 (HCC) 02/20/2015   HTN (hypertension) 02/20/2015   Dyslipidemia 02/20/2015   Bipolar affective disorder (HCC) 02/20/2015   Schizophrenia (HCC) 02/20/2015   Hypokalemia 02/20/2015    Past Surgical History:  Procedure Laterality Date   CERVICAL BIOPSY  W/ LOOP ELECTRODE EXCISION     left eye surgery  1970   TUBAL LIGATION  1989    OB History     Gravida  7   Para      Term      Preterm      AB  3   Living  4      SAB  1   IAB  2   Ectopic      Multiple      Live Births               Home Medications    Prior to Admission medications   Medication Sig Start Date End Date Taking? Authorizing Provider  hydrocortisone  (ANUSOL -HC) 25 MG suppository Place 1 suppository (25 mg total) rectally 2 (two) times daily. 07/07/24  Yes Leath-Warren, Etta PARAS, NP  albuterol  (PROVENTIL ) (2.5 MG/3ML) 0.083% nebulizer solution USE 1 VIAL  IN NEBULIZER EVERY 6 HOURS AS NEEDED FOR WHEEZING FOR SHORTNESS OF BREATH . APPOINTMENT REQUIRED FOR FUTURE REFILLS 07/06/24   Delbert Clam, MD  albuterol  (VENTOLIN  HFA) 108 (90 Base) MCG/ACT inhaler Inhale 2 puffs into the lungs every 6 (six) hours as needed for wheezing or shortness of breath. 06/21/24   Newlin, Enobong, MD  aspirin  EC 81 MG tablet Take 81 mg by mouth daily.    [provider]  fluticasone  furoate-vilanterol (BREO ELLIPTA ) 100-25 MCG/ACT AEPB Inhale 1 puff into the lungs daily. 02/10/24   Fleming, Zelda W, NP  gabapentin  (NEURONTIN ) 300 MG capsule Take 1 capsule (300 mg total) by mouth at bedtime. 01/07/24   Fleming, Zelda W, NP  glimepiride  (AMARYL ) 2 MG tablet Take 1 tablet (2 mg total) by mouth daily with breakfast. Please schedule PCP appointment for more refills. 04/14/24   Newlin, Enobong, MD  glucose  blood (ACCU-CHEK GUIDE) test strip Use as instructed. Check blood glucose by fingerstick twice per day. 09/17/22   Fleming, Zelda W, NP  ketorolac  (TORADOL ) 10 MG tablet Take 1 tablet (10 mg total) by mouth every 6 (six) hours as needed (pain). 03/29/24   Vonna Sharlet POUR, MD  lisinopril  (ZESTRIL ) 20 MG tablet Take 1 tablet (20 mg total) by mouth daily. Please schedule PCP appointment for more refills. 04/14/24   Newlin, Enobong, MD  loperamide  (IMODIUM ) 2 MG capsule Take 1 capsule (2 mg total) by mouth 4 (four) times daily as needed for diarrhea or loose stools. Patient not taking: Reported on 01/30/2024 07/22/23   Johnie Flaming A, NP  nitroGLYCERIN  (NITROSTAT ) 0.4 MG SL tablet Place 1 tablet (0.4 mg total) under the tongue every 5 (five) minutes as needed for chest pain. 09/24/23 12/23/23  Madie Jon Garre, PA  pantoprazole  (PROTONIX ) 40 MG tablet Take 1 tablet (40 mg total) by mouth daily. 01/04/24   Haze Lonni PARAS, MD  PARoxetine  (PAXIL ) 20 MG tablet Take 1 tablet (20 mg total) by mouth daily. 04/02/24   Fleming, Zelda W, NP  RYBELSUS  3 MG TABS Take 1 tablet by mouth once daily 06/04/24   Fleming, Zelda W, NP  simvastatin  (ZOCOR ) 40 MG tablet Take 1 tablet (40 mg total) by mouth daily. Please schedule PCP appointment for more refills. 04/14/24   Newlin, Enobong, MD  sucralfate  (CARAFATE ) 1 g tablet Take 1 tablet (1 g total) by mouth 4 (four) times daily -  with meals and at bedtime. 01/04/24   Haze Lonni PARAS, MD  triamcinolone  cream (KENALOG ) 0.1 % Apply 1 Application topically 2 (two) times daily. Patient not taking: Reported on 01/30/2024 08/26/23   Enedelia Dorna HERO, FNP  valACYclovir  (VALTREX ) 1000 MG tablet TAKE 1 TABLET BY MOUTH THREE TIMES DAILY 04/07/23   Fleming, Zelda W, NP    Family History Family History  Problem Relation Age of Onset   Hyperlipidemia Mother    Arthritis Mother    Parkinson's disease Mother    Heart attack Father    Diabetes Father    Hypertension  Father    Hyperlipidemia Father    Cancer Sister        cervical   Breast cancer Sister 25   Breast cancer Sister    Colon cancer Maternal Grandmother 60   Breast cancer Paternal Aunt    Kidney disease Other    Stroke Other    Pancreatic disease Other    Sickle cell anemia Other    Esophageal cancer Neg Hx    Rectal cancer Neg Hx  Social History Social History   Tobacco Use   Smoking status: Every Day    Current packs/day: 0.25    Average packs/day: 0.3 packs/day for 4.0 years (1.0 ttl pk-yrs)    Types: Cigarettes   Smokeless tobacco: Never  Vaping Use   Vaping status: Never Used  Substance Use Topics   Alcohol use: No    Alcohol/week: 0.0 standard drinks of alcohol   Drug use: No    Comment: no drug use for 7 years     Allergies   Sulfa antibiotics   Review of Systems Review of Systems Per HPI  Physical Exam Triage Vital Signs ED Triage Vitals  Encounter Vitals Group     BP 07/07/24 1648 (!) 167/93     Girls Systolic BP Percentile --      Girls Diastolic BP Percentile --      Boys Systolic BP Percentile --      Boys Diastolic BP Percentile --      Pulse Rate 07/07/24 1648 91     Resp 07/07/24 1648 18     Temp 07/07/24 1648 98.6 F (37 C)     Temp Source 07/07/24 1648 Oral     SpO2 07/07/24 1644 91 %     Weight --      Height --      Head Circumference --      Peak Flow --      Pain Score 07/07/24 1651 8     Pain Loc --      Pain Education --      Exclude from Growth Chart --    No data found.  Updated Vital Signs BP (!) 167/93 (BP Location: Right Arm)   Pulse 91   Temp 98.6 F (37 C) (Oral)   Resp 18   LMP 07/21/2013   SpO2 98%   Visual Acuity Right Eye Distance:   Left Eye Distance:   Bilateral Distance:    Right Eye Near:   Left Eye Near:    Bilateral Near:     Physical Exam Vitals and nursing note reviewed.  Constitutional:      General: She is not in acute distress.    Appearance: Normal appearance.  HENT:      Mouth/Throat:     Mouth: Mucous membranes are moist.  Eyes:     Extraocular Movements: Extraocular movements intact.     Pupils: Pupils are equal, round, and reactive to light.  Cardiovascular:     Rate and Rhythm: Normal rate and regular rhythm.     Pulses: Normal pulses.     Heart sounds: Normal heart sounds.  Pulmonary:     Effort: Pulmonary effort is normal.     Breath sounds: Normal breath sounds.  Abdominal:     General: Bowel sounds are normal.     Palpations: Abdomen is soft.     Tenderness: There is abdominal tenderness in the right lower quadrant and left lower quadrant.  Musculoskeletal:     Cervical back: Normal range of motion.  Skin:    General: Skin is warm and dry.  Neurological:     General: No focal deficit present.     Mental Status: She is alert and oriented to person, place, and time.  Psychiatric:        Mood and Affect: Mood is elated.        Speech: Speech is rapid and pressured.        Behavior: Behavior is hyperactive. Behavior is cooperative.  Cognition and Memory: Cognition and memory normal.      UC Treatments / Results  Labs (all labs ordered are listed, but only abnormal results are displayed) Labs Reviewed  POCT URINE DIPSTICK - Abnormal; Notable for the following components:      Result Value   Clarity, UA cloudy (*)    Blood, UA trace-intact (*)    Protein Ur, POC =30 (*)    All other components within normal limits  URINE CULTURE    EKG: Normal sinus rhythm, no ectopy, no STEMI.  Compared to EKGs dated 06/04/2024, 01/05/2024, and 09/25/2023.   Radiology No results found.  Procedures Procedures (including critical care time)  Medications Ordered in UC Medications  ketorolac  (TORADOL ) 30 MG/ML injection 30 mg (30 mg Intramuscular Given 07/07/24 1720)    Initial Impression / Assessment and Plan / UC Course  I have reviewed the triage vital signs and the nursing notes.  Pertinent labs & imaging results that were available  during my care of the patient were reviewed by me and considered in my medical decision making (see chart for details).  Patient presents with complaints of lower abdominal pain, rectal pain, and chest pain.  EKG shows normal sinus rhythm, no ectopy.  Urinalysis did not indicate an obvious urinary tract infection; urine culture is pending.  Decadron  30 mg IM administered for abdominal pain.  Will provide symptomatic treatment with Anusol  suppositories.  Supportive care recommendations were provided discussed with the patient to include fluids, rest, continuing over-the-counter analgesics, and a bland diet.  Patient advised to follow-up with her PCP for further evaluation.  Patient was also given strict ER follow-up precautions.  Patient was in agreement with this plan of care and verbalizes understanding.  All questions were answered.  Patient stable for discharge.  Final Clinical Impressions(s) / UC Diagnoses   Final diagnoses:  Lower abdominal pain  Chest pain, unspecified type  Rectal pain     Discharge Instructions      Your EKG was normal today.  A urine culture has also been ordered.  You will be contacted if the results of the culture are abnormal.  You will also have access to your results via MyChart. You were given an injection of Decadron  10 mg.  Do not take any additional NSAIDs today such as ibuprofen , Aleve, Motrin , Advil , or naproxen.  You may take Tylenol  for pain or discomfort. Increase fluids and allow for plenty of rest. Recommend a bland diet until your abdominal symptoms improved.  This includes soup, broth, yogurt, pudding, or Jell-O. Go to the emergency department immediately if you have worsening abdominal pain, chest pain, or other concerns. Please follow-up with your primary care physician if symptoms fail to improve. Follow-up as needed.     ED Prescriptions     Medication Sig Dispense Auth. Provider   hydrocortisone  (ANUSOL -HC) 25 MG suppository Place 1  suppository (25 mg total) rectally 2 (two) times daily. 12 suppository Leath-Warren, Etta PARAS, NP      PDMP not reviewed this encounter.   [place] Etta PARAS, NP 07/07/24 1739

## 2024-07-07 NOTE — ED Triage Notes (Signed)
 Pt reports chest pain, in the left arm and abdominal pain. That started this morning. Abdominal pain started yesterday. Has dome imodium  AD, delsym, Nyquil, tylenol  PM, alka seltzer cold plus DayQuil.

## 2024-07-07 NOTE — Discharge Instructions (Signed)
 Your EKG was normal today.  A urine culture has also been ordered.  You will be contacted if the results of the culture are abnormal.  You will also have access to your results via MyChart. You were given an injection of Decadron  10 mg.  Do not take any additional NSAIDs today such as ibuprofen , Aleve, Motrin , Advil , or naproxen.  You may take Tylenol  for pain or discomfort. Increase fluids and allow for plenty of rest. Recommend a bland diet until your abdominal symptoms improved.  This includes soup, broth, yogurt, pudding, or Jell-O. Go to the emergency department immediately if you have worsening abdominal pain, chest pain, or other concerns. Please follow-up with your primary care physician if symptoms fail to improve. Follow-up as needed.

## 2024-07-09 ENCOUNTER — Ambulatory Visit (HOSPITAL_COMMUNITY): Payer: Self-pay

## 2024-07-09 LAB — URINE CULTURE: Culture: NO GROWTH

## 2024-07-14 ENCOUNTER — Other Ambulatory Visit: Payer: Self-pay | Admitting: Family Medicine

## 2024-07-14 DIAGNOSIS — E119 Type 2 diabetes mellitus without complications: Secondary | ICD-10-CM

## 2024-07-15 NOTE — Telephone Encounter (Signed)
 Requested Prescriptions  Pending Prescriptions Disp Refills   glimepiride  (AMARYL ) 2 MG tablet [Pharmacy Med Name: Glimepiride  2 MG Oral Tablet] 30 tablet 0    Sig: TAKE 1 TABLET BY MOUTH ONCE DAILY WITH BREAKFAST . APPOINTMENT REQUIRED FOR FUTURE REFILLS     Endocrinology:  Diabetes - Sulfonylureas Passed - 07/15/2024 12:44 PM      Passed - HBA1C is between 0 and 7.9 and within 180 days    HbA1c, POC (controlled diabetic range)  Date Value Ref Range Status  04/09/2022 6.3 0.0 - 7.0 % Final   Hgb A1c MFr Bld  Date Value Ref Range Status  01/22/2024 6.4 (H) 4.8 - 5.6 % Final    Comment:             Prediabetes: 5.7 - 6.4          Diabetes: >6.4          Glycemic control for adults with diabetes: <7.0          Passed - Cr in normal range and within 360 days    Creatinine, Ser  Date Value Ref Range Status  06/03/2024 0.61 0.44 - 1.00 mg/dL Final         Passed - Valid encounter within last 6 months    Recent Outpatient Visits           5 months ago Diabetes mellitus treated with oral medication (HCC)   [facility] [place]. [facility] Theotis Haze ORN, NP   6 months ago Primary hypertension   [facility] Of [place]. [facility] Theotis Haze ORN, NP   1 year ago Mild intermittent asthma without complication   [facility] Dept Of [place]. [facility] Theotis Haze ORN, NP   1 year ago Encounter for [employer] annual wellness exam   [facility] Dept Of [place]. [facility] Theotis Haze ORN, NP   1 year ago Encounter for Papanicolaou smear for cervical cancer screening   [facility] [place] [facility] Dept [facility] [place]. [facility] Theotis Haze ORN, [place]

## 2024-08-04 ENCOUNTER — Telehealth: Payer: Self-pay | Admitting: Nurse Practitioner

## 2024-08-04 NOTE — Telephone Encounter (Signed)
 Pt confirmed appt 10/1 (per vr )

## 2024-08-06 ENCOUNTER — Encounter: Admitting: Nurse Practitioner

## 2024-08-11 ENCOUNTER — Encounter: Payer: Self-pay | Admitting: Neurology

## 2024-08-11 ENCOUNTER — Ambulatory Visit (INDEPENDENT_AMBULATORY_CARE_PROVIDER_SITE_OTHER): Admitting: Neurology

## 2024-08-11 ENCOUNTER — Other Ambulatory Visit

## 2024-08-11 VITALS — BP 144/80 | HR 88 | Ht 61.0 in | Wt 196.0 lb

## 2024-08-11 DIAGNOSIS — R200 Anesthesia of skin: Secondary | ICD-10-CM

## 2024-08-11 DIAGNOSIS — R413 Other amnesia: Secondary | ICD-10-CM | POA: Diagnosis not present
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
## 2024-08-12 ENCOUNTER — Ambulatory Visit: Payer: Self-pay | Admitting: Neurology

## 2024-08-12 ENCOUNTER — Other Ambulatory Visit: Payer: Self-pay | Admitting: Nurse Practitioner

## 2024-08-12 DIAGNOSIS — E119 Type 2 diabetes mellitus without complications: Secondary | ICD-10-CM

## 2024-08-12 LAB — TSH: TSH: 0.82 m[IU]/L (ref 0.40–4.50)

## 2024-08-12 LAB — VITAMIN B12: Vitamin B-12: 439 pg/mL (ref 200–1100)

## 2024-08-24 ENCOUNTER — Other Ambulatory Visit: Payer: Self-pay | Admitting: Family Medicine

## 2024-08-24 DIAGNOSIS — J4520 Mild intermittent asthma, uncomplicated: Secondary | ICD-10-CM

## 2024-08-26 ENCOUNTER — Other Ambulatory Visit

## 2024-08-30 ENCOUNTER — Ambulatory Visit
Admission: RE | Admit: 2024-08-30 | Discharge: 2024-08-30 | Disposition: A | Discharge status: Home / Self Care | Source: Ambulatory Visit | Attending: Neurology | Admitting: Neurology

## 2024-08-30 DIAGNOSIS — R413 Other amnesia: Secondary | ICD-10-CM

## 2024-08-30 DIAGNOSIS — R200 Anesthesia of skin: Secondary | ICD-10-CM

## 2024-08-31 ENCOUNTER — Ambulatory Visit: Admitting: Nurse Practitioner

## 2024-09-09 ENCOUNTER — Ambulatory Visit (HOSPITAL_COMMUNITY)
Admission: EM | Admit: 2024-09-09 | Discharge: 2024-09-09 | Disposition: A | Discharge status: Home / Self Care | Attending: Nurse Practitioner | Admitting: Nurse Practitioner

## 2024-09-09 ENCOUNTER — Encounter (HOSPITAL_COMMUNITY): Payer: Self-pay

## 2024-09-09 DIAGNOSIS — R051 Acute cough: Secondary | ICD-10-CM | POA: Diagnosis not present

## 2024-09-09 DIAGNOSIS — J069 Acute upper respiratory infection, unspecified: Secondary | ICD-10-CM | POA: Diagnosis not present

## 2024-09-09 LAB — POC SOFIA SARS ANTIGEN FIA: SARS Coronavirus 2 Ag: NEGATIVE

## 2024-09-09 MED ORDER — BENZONATATE 200 MG PO CAPS: 200.0000 mg | ORAL_CAPSULE | Freq: Three times a day (TID) | ORAL | 0 refills | Status: AC | PRN

## 2024-09-09 MED ORDER — AMOXICILLIN-POT CLAVULANATE 875-125 MG PO TABS: 1.0000 | ORAL_TABLET | Freq: Two times a day (BID) | ORAL | 0 refills | Status: AC

## 2024-09-09 NOTE — ED Triage Notes (Signed)
 Onset last Monday, 10 days ago, with sore throat, ear pain, diarrhea, body aches, emesis, and chest and rib pain with breathing. Patient states her grand kids were sick and one of them had COVID.   Patient took otc pain and cold meds with no relief.

## 2024-09-09 NOTE — Discharge Instructions (Signed)
 Based upon the duration of your symptoms, along with a fever, we will go ahead and treat you with an antibiotic.  I have also sent in a capsule to help alleviate your cough.  Continue to use your inhalers as prescribed.  Follow up for re-evaluation if your symptoms worsen or fail to improve.

## 2024-09-09 NOTE — ED Provider Notes (Addendum)
 [facility]    CSN: [id number] Arrival date & time: 09/09/24  9066      History   Chief Complaint Chief Complaint  Patient presents with   Cough   Emesis    HPI Melissa Pham is a 58 y.o. female.   Patient presents requesting evaluation for a 10-day history of URI symptoms and a cough.  She had 3 known exposures to COVID-positive people.  States she has had nasal congestion, sinus pressure, sore throat, cough, several episodes of vomiting, and headache.  Fever was measured up to 101.  Patient reports she is now having bilateral chest discomfort that radiates to her back (associated with breathing and cough).  Has taken both Mucinex and TheraFlu (2 boxes).  She also has been utilizing a humidifier.  Patient has also been using her inhalers as prescribed.  Some of her symptoms have improved.  She is no longer vomiting.  Presented today due to feeling achy, the chest discomfort, and the persistent cough.  It is congested/productive at times.    The history is provided by the patient.  Cough Associated symptoms: chills, fever, headaches, myalgias and sore throat   Associated symptoms: no chest pain and no rash   Emesis Associated symptoms: chills, cough, fever, headaches, myalgias and sore throat   Associated symptoms: no abdominal pain and no diarrhea     Past Medical History:  Diagnosis Date   ADHD (attention deficit hyperactivity disorder)    Anxiety    Asthma    Bipolar disorder (HCC)    Blood transfusion without reported diagnosis 11/05/1983   after childbirth   Coronary artery disease    Depression    Diabetes mellitus without complication (HCC)    GERD (gastroesophageal reflux disease)    History of illicit drug use    Hyperlipidemia    Hypertension    Schizophrenia (HCC)    Sickle cell trait    Tobacco use     Patient Active Problem List   Diagnosis Date Noted   Syncope 12/06/2018   CAD in native artery    Tobacco abuse 07/27/2018   Pure  hypercholesterolemia    Generalized abdominal discomfort 12/10/2017   Effusion, right knee 03/13/2017   Sprain of right knee 03/13/2017   Chest pain 02/20/2015   Suprapubic pain 02/20/2015   Diarrhea 02/20/2015   Diabetes mellitus, type 2 (HCC) 02/20/2015   HTN (hypertension) 02/20/2015   Dyslipidemia 02/20/2015   Bipolar affective disorder (HCC) 02/20/2015   Schizophrenia (HCC) 02/20/2015   Hypokalemia 02/20/2015    Past Surgical History:  Procedure Laterality Date   CERVICAL BIOPSY  W/ LOOP ELECTRODE EXCISION     left eye surgery  1970   TUBAL LIGATION  1989    OB History     Gravida  7   Para      Term      Preterm      AB  3   Living  4      SAB  1   IAB  2   Ectopic      Multiple      Live Births               Home Medications    Prior to Admission medications   Medication Sig Start Date End Date Taking? Authorizing Provider  albuterol  (PROVENTIL ) (2.5 MG/3ML) 0.083% nebulizer solution USE 1 VIAL IN NEBULIZER EVERY 6 HOURS AS NEEDED FOR WHEEZING FOR SHORTNESS OF BREATH . APPOINTMENT REQUIRED FOR FUTURE REFILLS  07/06/24  Yes Newlin, Corrina, MD  albuterol  (VENTOLIN  HFA) 108 (90 Base) MCG/ACT inhaler INHALE 2 PUFFS BY MOUTH EVERY 6 HOURS AS NEEDED FOR WHEEZING FOR SHORTNESS OF BREATH ** MUST HAVE OFFICE VISIT FOR REFILLS** 08/25/24  Yes Newlin, Enobong, MD  amoxicillin -clavulanate (AUGMENTIN ) 875-125 MG tablet Take 1 tablet by mouth every 12 (twelve) hours. 09/09/24  Yes Janet Therisa PARAS, FNP  aspirin  EC 81 MG tablet Take 81 mg by mouth daily.   Yes [provider]  benzonatate  (TESSALON ) 200 MG capsule Take 1 capsule (200 mg total) by mouth every 8 (eight) hours as needed for cough. 09/09/24  Yes Janet Therisa PARAS, FNP  fluticasone  furoate-vilanterol (BREO ELLIPTA ) 100-25 MCG/ACT AEPB Inhale 1 puff into the lungs daily. 02/10/24  Yes Theotis Haze ORN, NP  gabapentin  (NEURONTIN ) 300 MG capsule Take 1 capsule (300 mg total) by mouth at bedtime. 01/07/24   Yes Theotis Haze ORN, NP  glimepiride  (AMARYL ) 2 MG tablet TAKE 1 TABLET BY MOUTH ONCE DAILY WITH BREAKFAST. APPOINTMENT REQUIRED FOR FUTURE REFILLS 08/13/24  Yes Fleming, Zelda W, NP  glucose blood (ACCU-CHEK GUIDE) test strip Use as instructed. Check blood glucose by fingerstick twice per day. 09/17/22  Yes Fleming, Zelda W, NP  hydrocortisone  (ANUSOL -HC) 25 MG suppository Place 1 suppository (25 mg total) rectally 2 (two) times daily. 07/07/24  Yes Leath-Warren, Etta PARAS, NP  ketorolac  (TORADOL ) 10 MG tablet Take 1 tablet (10 mg total) by mouth every 6 (six) hours as needed (pain). 03/29/24  Yes Vonna Sharlet POUR, MD  lisinopril  (ZESTRIL ) 20 MG tablet Take 1 tablet (20 mg total) by mouth daily. Please schedule PCP appointment for more refills. 04/14/24  Yes Newlin, Enobong, MD  loperamide  (IMODIUM ) 2 MG capsule Take 1 capsule (2 mg total) by mouth 4 (four) times daily as needed for diarrhea or loose stools. 07/22/23  Yes Johnie, Carley A, NP  pantoprazole  (PROTONIX ) 40 MG tablet Take 1 tablet (40 mg total) by mouth daily. 01/04/24  Yes Pollina, Lonni PARAS, MD  PARoxetine  (PAXIL ) 20 MG tablet Take 1 tablet (20 mg total) by mouth daily. 04/02/24  Yes Theotis Haze ORN, NP  RYBELSUS  3 MG TABS Take 1 tablet by mouth once daily 06/04/24  Yes Fleming, Zelda W, NP  simvastatin  (ZOCOR ) 40 MG tablet Take 1 tablet (40 mg total) by mouth daily. Please schedule PCP appointment for more refills. 04/14/24  Yes Newlin, Enobong, MD  sucralfate  (CARAFATE ) 1 g tablet Take 1 tablet (1 g total) by mouth 4 (four) times daily -  with meals and at bedtime. 01/04/24  Yes Pollina, Lonni PARAS, MD  triamcinolone  cream (KENALOG ) 0.1 % Apply 1 Application topically 2 (two) times daily. 08/26/23  Yes Enedelia Dorna HERO, FNP  valACYclovir  (VALTREX ) 1000 MG tablet TAKE 1 TABLET BY MOUTH THREE TIMES DAILY 04/07/23  Yes Fleming, Zelda W, NP  nitroGLYCERIN  (NITROSTAT ) 0.4 MG SL tablet Place 1 tablet (0.4 mg total) under the tongue  every 5 (five) minutes as needed for chest pain. 09/24/23 08/11/24  DukeJon Garre, PA    Family History Family History  Problem Relation Age of Onset   Hyperlipidemia Mother    Arthritis Mother    Parkinson's disease Mother    Heart attack Father    Diabetes Father    Hypertension Father    Hyperlipidemia Father    Cancer Sister        cervical   Breast cancer Sister 64   Breast cancer Sister    Colon cancer Maternal  Grandmother 33   Breast cancer Paternal Aunt    Kidney disease Other    Stroke Other    Pancreatic disease Other    Sickle cell anemia Other    Esophageal cancer Neg Hx    Rectal cancer Neg Hx     Social History Social History   Tobacco Use   Smoking status: Every Day    Current packs/day: 0.25    Average packs/day: 0.3 packs/day for 4.0 years (1.0 ttl pk-yrs)    Types: Cigarettes   Smokeless tobacco: Never  Vaping Use   Vaping status: Never Used  Substance Use Topics   Alcohol use: No    Alcohol/week: 0.0 standard drinks of alcohol   Drug use: No    Comment: no drug use for 7 years     Allergies   Sulfa antibiotics   Review of Systems Review of Systems  Constitutional:  Positive for chills, fatigue and fever.  HENT:  Positive for congestion, sinus pressure and sore throat.   Eyes:  Negative for pain and redness.  Respiratory:  Positive for cough and chest tightness.   Cardiovascular:  Negative for chest pain.  Gastrointestinal:  Positive for vomiting. Negative for abdominal pain, constipation and diarrhea.  Genitourinary:  Negative for dysuria and urgency.  Musculoskeletal:  Positive for myalgias. Negative for back pain.  Skin:  Negative for rash.  Neurological:  Positive for headaches. Negative for dizziness.  Psychiatric/Behavioral:  The patient is not nervous/anxious.      Physical Exam Triage Vital Signs ED Triage Vitals  Encounter Vitals Group     BP 09/09/24 1025 (!) 154/87     Girls Systolic BP Percentile --      Girls  Diastolic BP Percentile --      Boys Systolic BP Percentile --      Boys Diastolic BP Percentile --      Pulse Rate 09/09/24 1025 80     Resp 09/09/24 1025 18     Temp 09/09/24 1025 98 F (36.7 C)     Temp Source 09/09/24 1025 Oral     SpO2 09/09/24 1025 93 %     Weight --      Height 09/09/24 1025 5' 1 (1.549 m)     Head Circumference --      Peak Flow --      Pain Score 09/09/24 1024 8     Pain Loc --      Pain Education --      Exclude from Growth Chart --    No data found.  Updated Vital Signs BP (!) 154/87 (BP Location: Left Arm)   Pulse 80   Temp 98 F (36.7 C) (Oral)   Resp 18   Ht 5' 1 (1.549 m)   LMP 07/21/2013   SpO2 93%   BMI 37.03 kg/m   Physical Exam Vitals and nursing note reviewed.  Constitutional:      Appearance: Normal appearance.  HENT:     Head: Normocephalic.     Right Ear: Tympanic membrane and ear canal normal.     Left Ear: Tympanic membrane and ear canal normal.     Nose: Nose normal.     Mouth/Throat:     Mouth: Mucous membranes are moist.     Pharynx: No posterior oropharyngeal erythema.  Eyes:     Conjunctiva/sclera: Conjunctivae normal.     Pupils: Pupils are equal, round, and reactive to light.  Cardiovascular:     Rate and Rhythm: Normal rate  and regular rhythm.     Heart sounds: No murmur heard. Pulmonary:     Effort: Pulmonary effort is normal.     Comments: Diminished diffusely bilaterally (right>left). Abdominal:     General: Bowel sounds are normal.  Musculoskeletal:     Cervical back: Normal range of motion.  Skin:    General: Skin is warm and dry.  Neurological:     General: No focal deficit present.     Mental Status: She is alert and oriented to person, place, and time.  Psychiatric:        Mood and Affect: Mood normal.        Behavior: Behavior normal.        Thought Content: Thought content normal.        Judgment: Judgment normal.    UC Treatments / Results  Labs (all labs ordered are listed, but only  abnormal results are displayed) Labs Reviewed  POC SOFIA SARS ANTIGEN FIA    EKG   Radiology No results found.  Procedures Procedures (including critical care time)  Medications Ordered in UC Medications - No data to display  Initial Impression / Assessment and Plan / UC Course  I have reviewed the triage vital signs and the nursing notes.  Pertinent labs & imaging results that were available during my care of the patient were reviewed by me and considered in my medical decision making (see chart for details).    Patient presents for evaluation of a 10-day history of URI symptoms and a cough.  She was exposed to 3 persons with a known COVID diagnosis.  COVID testing here today is negative.  Her symptoms have been improving but she is reporting a cough with associated chest discomfort that radiates to her back.  The cough is occasionally productive.  Her fever has resolved at this time.  However, based upon age and duration of symptoms-reasonable to provide antibiotic therapy at this time.  I have also prescribed Tessalon  to assist with management of the cough.  No indication for chest x-ray today as she is afebrile, physical exam and vital signs overall reassuring.  However, I discussed that if she develops worsening chest discomfort, return of the fever, or shortness of breath-return for evaluation and consideration imaging at that time.  Continue to utilize inhalers as prescribed. Final Clinical Impressions(s) / UC Diagnoses   Final diagnoses:  Acute upper respiratory infection  Acute cough     Discharge Instructions      Based upon the duration of your symptoms, along with a fever, we will go ahead and treat you with an antibiotic.  I have also sent in a capsule to help alleviate your cough.  Continue to use your inhalers as prescribed.  Follow up for re-evaluation if your symptoms worsen or fail to improve.     ED Prescriptions     Medication Sig Dispense Auth. Provider    amoxicillin -clavulanate (AUGMENTIN ) 875-125 MG tablet Take 1 tablet by mouth every 12 (twelve) hours. 14 tablet Janet Therisa PARAS, FNP   benzonatate  (TESSALON ) 200 MG capsule Take 1 capsule (200 mg total) by mouth every 8 (eight) hours as needed for cough. 30 capsule Janet Therisa PARAS, FNP      PDMP not reviewed this encounter.   Janet Therisa PARAS, FNP 09/09/24 1103    Janet Therisa PARAS, FNP 09/09/24 1104

## 2024-10-01 ENCOUNTER — Other Ambulatory Visit: Payer: Self-pay | Admitting: Nurse Practitioner

## 2024-10-01 DIAGNOSIS — E119 Type 2 diabetes mellitus without complications: Secondary | ICD-10-CM

## 2024-10-06 ENCOUNTER — Other Ambulatory Visit: Payer: Self-pay | Admitting: Nurse Practitioner

## 2024-10-06 ENCOUNTER — Other Ambulatory Visit: Payer: Self-pay | Admitting: Family Medicine

## 2024-10-06 DIAGNOSIS — I10 Essential (primary) hypertension: Secondary | ICD-10-CM

## 2024-10-06 DIAGNOSIS — E7800 Pure hypercholesterolemia, unspecified: Secondary | ICD-10-CM

## 2024-10-06 DIAGNOSIS — E119 Type 2 diabetes mellitus without complications: Secondary | ICD-10-CM

## 2024-10-19 ENCOUNTER — Ambulatory Visit: Admitting: Internal Medicine

## 2024-10-20 ENCOUNTER — Telehealth: Payer: Self-pay | Admitting: Nurse Practitioner

## 2024-10-20 NOTE — Telephone Encounter (Signed)
 Noted

## 2024-10-20 NOTE — Telephone Encounter (Signed)
 Copied from CRM #[id number]. Topic: General - Other >> Oct 20, 2024  3:24 PM Emylou G wrote:  Reason for CRM: Linzy w/[facility] - [phone].SABRA option 1.. wants to clarify if patient has chronic condition - in regards to her insurance.. she is also faxing in a req

## 2024-10-31 ENCOUNTER — Other Ambulatory Visit: Payer: Self-pay | Admitting: Nurse Practitioner

## 2024-10-31 DIAGNOSIS — E119 Type 2 diabetes mellitus without complications: Secondary | ICD-10-CM

## 2024-11-02 ENCOUNTER — Ambulatory Visit: Payer: Self-pay

## 2024-11-02 ENCOUNTER — Ambulatory Visit (INDEPENDENT_AMBULATORY_CARE_PROVIDER_SITE_OTHER): Admitting: Primary Care

## 2024-11-02 ENCOUNTER — Encounter (INDEPENDENT_AMBULATORY_CARE_PROVIDER_SITE_OTHER): Payer: Self-pay | Admitting: Primary Care

## 2024-11-02 VITALS — BP 141/85 | HR 107 | Resp 16 | Ht 61.0 in | Wt 189.0 lb

## 2024-11-02 DIAGNOSIS — Z720 Tobacco use: Secondary | ICD-10-CM

## 2024-11-02 DIAGNOSIS — R197 Diarrhea, unspecified: Secondary | ICD-10-CM | POA: Diagnosis not present

## 2024-11-02 DIAGNOSIS — R1110 Vomiting, unspecified: Secondary | ICD-10-CM

## 2024-11-02 DIAGNOSIS — R058 Other specified cough: Secondary | ICD-10-CM

## 2024-11-02 MED ORDER — ONDANSETRON HCL 4 MG PO TABS: 4.0000 mg | ORAL_TABLET | Freq: Three times a day (TID) | ORAL | 0 refills | Status: AC | PRN

## 2024-11-02 MED ORDER — LOPERAMIDE HCL 2 MG PO CAPS: 2.0000 mg | ORAL_CAPSULE | Freq: Four times a day (QID) | ORAL | 0 refills | Status: AC | PRN

## 2024-11-02 MED ORDER — GUAIFENESIN 200 MG PO TABS: ORAL_TABLET | ORAL | 0 refills | Status: AC

## 2024-11-02 NOTE — Telephone Encounter (Signed)
 FYI Only or Action Required?: Action required by provider: request for appointment.  Patient was last seen in primary care on 01/30/2024 by Theotis Haze ORN, NP.  Called Nurse Triage reporting Cough.  Symptoms began several days ago.  Interventions attempted: Nothing.  Symptoms are: gradually worsening.Productive cough, diarrhea, achy, flu symptoms per pt.  Triage Disposition: See Physician Within 24 Hours  Patient/caregiver understands and will follow disposition?: Yes      Copied from CRM #[id number]. Topic: Clinical - Red Word Triage >> Nov 02, 2024  9:26 AM Deleta RAMAN wrote: Red Word that prompted transfer to Nurse Triage: patient states she does not feel well vomiting for over a week. Believes she has food poison. Feel like she has the flu sore throat and coughing Reason for Disposition  [1] Continuous (nonstop) coughing interferes with work or school AND [2] no improvement using cough treatment per Care Advice  Answer Assessment - Initial Assessment Questions 1. ONSET: When did the cough begin?      2 weeks 2. SEVERITY: How bad is the cough today?      severe 3. SPUTUM: Describe the color of your sputum (e.g., none, dry cough; clear, white, yellow, green)     yellow 4. HEMOPTYSIS: Are you coughing up any blood? If Yes, ask: How much? (e.g., flecks, streaks, tablespoons, etc.)     no 5. DIFFICULTY BREATHING: Are you having difficulty breathing? If Yes, ask: How bad is it? (e.g., mild, moderate, severe)      no 6. FEVER: Do you have a fever? If Yes, ask: What is your temperature, how was it measured, and when did it start?     99 7. CARDIAC HISTORY: Do you have any history of heart disease? (e.g., heart attack, congestive heart failure)      no 8. LUNG HISTORY: Do you have any history of lung disease?  (e.g., pulmonary embolus, asthma, emphysema)     Asthma  9. PE RISK FACTORS: Do you have a history of blood clots? (or: recent major surgery, recent  prolonged travel, bedridden)     no 10. OTHER SYMPTOMS: Do you have any other symptoms? (e.g., runny nose, wheezing, chest pain)       wheezing 11. PREGNANCY: Is there any chance you are pregnant? When was your last menstrual period?       no 12. TRAVEL: Have you traveled out of the country in the last month? (e.g., travel history, exposures)       no  Protocols used: Cough - Acute Productive-A-AH

## 2024-11-02 NOTE — Progress Notes (Signed)
 " [facility]  Melissa Pham, is a 58 y.o. female  RDW:[id number]  FMW:[id number]  DOB - [birth year]  Chief Complaint  Patient presents with   Cough    Been sick for 2 months on and off   Feel like she has the flu sore throat and coughing  Started 2 weeks ago   The cough is severe   Yellow spectum  Temp 99  Hx of asthma   Has been taking otc medication and no relief    Diarrhea    Believes she has food poison   Emesis    patient states she does not feel well vomiting for over a week.  Believes she has food poison  Dry heaving        Subjective:   Melissa Pham is a 58 y.o. female here today for an acute visit.Acute at cook out s/s of emesis and diarrhea started same day and continues . Productive cough . Home Remedies not working. Menopause sweating all the time.   HPI  No problems updated.  Comprehensive ROS Pertinent positive and negative noted in HPI   Allergies[1]  Past Medical History:  Diagnosis Date   ADHD (attention deficit hyperactivity disorder)    Anxiety    Asthma    Bipolar disorder (HCC)    Blood transfusion without reported diagnosis 11/05/1983   after childbirth   Coronary artery disease    Depression    Diabetes mellitus without complication (HCC)    GERD (gastroesophageal reflux disease)    History of illicit drug use    Hyperlipidemia    Hypertension    Schizophrenia (HCC)    Sickle cell trait    Tobacco use     Medications Ordered Prior to Encounter[2] Health Maintenance  Topic Date Due   Complete foot exam   Never done   Pneumococcal Vaccine for age over 64 (1 of 2 - PCV) Never done   Colon Cancer Screening  Never done   Yearly kidney health urinalysis for diabetes  11/23/2023   Medicare Annual Wellness Visit  01/20/2024   Flu Shot  06/04/2024   COVID-19 Vaccine (6 - 2025-26 season) 07/05/2024   Hemoglobin A1C  07/24/2024   Eye exam for diabetics  04/09/2025   Yearly kidney function blood test for  diabetes  06/03/2025   Breast Cancer Screening  06/23/2026   Pap with HPV screening  11/23/2027   DTaP/Tdap/Td vaccine (3 - Td or Tdap) 09/22/2033   Hepatitis C Screening  Completed   HIV Screening  Completed   Zoster (Shingles) Vaccine  Completed   Hepatitis B Vaccine  Aged Out   HPV Vaccine  Aged Out   Meningitis B Vaccine  Aged Out    Objective:   Vitals:   11/02/24 1049  BP: (!) 141/85  Pulse: (!) 107  Resp: 16  SpO2: 100%  Weight: 189 lb (85.7 kg)  Height: 5' 1 (1.549 m)   BP Readings from Last 3 Encounters:  11/02/24 (!) 141/85  09/09/24 (!) 154/87  08/11/24 (!) 144/80      Physical Exam Vitals reviewed.  Constitutional:      Appearance: Normal appearance. She is obese.  HENT:     Head: Normocephalic.     Right Ear: Tympanic membrane, ear canal and external ear normal.     Left Ear: Tympanic membrane, ear canal and external ear normal.     Nose: Congestion and rhinorrhea present.     Mouth/Throat:     Mouth: Mucous membranes  are moist.     Pharynx: Posterior oropharyngeal erythema present.  Eyes:     Extraocular Movements: Extraocular movements intact.     Pupils: Pupils are equal, round, and reactive to light.  Cardiovascular:     Rate and Rhythm: Normal rate and regular rhythm.  Pulmonary:     Effort: Pulmonary effort is normal.     Breath sounds: Normal breath sounds.  Abdominal:     General: Bowel sounds are normal.     Palpations: Abdomen is soft.  Musculoskeletal:        General: Normal range of motion.     Cervical back: Normal range of motion.  Skin:    General: Skin is warm and dry.  Neurological:     Mental Status: She is alert and oriented to person, place, and time.  Psychiatric:        Mood and Affect: Mood normal.        Behavior: Behavior normal.        Thought Content: Thought content normal.       Assessment & Plan  Melissa Pham was seen today for cough, diarrhea and emesis.  Diagnoses and all orders for this visit:  Tobacco  abuse - I have recommended complete cessation of tobacco use. I have discussed various options available for assistance with tobacco cessation including over the counter methods (Nicotine  gum, patch and lozenges). We also discussed prescription options (Chantix, Nicotine  Inhaler / Nasal Spray). The patient is not interested in pursuing any prescription tobacco cessation options at this time. - Patient declines at this time.  - Less than 5 minutes spent on counseling.   Vomiting, unspecified vomiting type, unspecified whether nausea present  Diarrhea, unspecified type 2/2  Productive cough  Other orders -     guaiFENesin  200 MG tablet; Take 1 400mg  tablet every 6 hours as needed for productive cough as needed -     ondansetron  (ZOFRAN ) 4 MG tablet; Take 1 tablet (4 mg total) by mouth every 8 (eight) hours as needed for nausea or vomiting. -     loperamide  (IMODIUM ) 2 MG capsule; Take 1 capsule (2 mg total) by mouth 4 (four) times daily as needed for diarrhea or loose stools.   Patient have been counseled extensively about nutrition and exercise. Other issues discussed during this visit include: low cholesterol diet, weight control and daily exercise, foot care, annual eye examinations at Ophthalmology, importance of adherence with medications and regular follow-up. We also discussed long term complications of uncontrolled diabetes and hypertension.  pcp  The patient was given clear instructions to go to ER or return to medical center if symptoms don't improve, worsen or new problems develop. The patient verbalized understanding. The patient was told to call to get lab results if they haven't heard anything in the next week.   This note has been created with Education officer, environmental. Any transcriptional errors are unintentional.   Melissa SHAUNNA Bohr, NP 11/02/2024, 10:57 AM     [1]  Allergies Allergen Reactions   Sulfa Antibiotics Anaphylaxis  [2]   Current Outpatient Medications on File Prior to Visit  Medication Sig Dispense Refill   albuterol  (PROVENTIL ) (2.5 MG/3ML) 0.083% nebulizer solution USE 1 VIAL IN NEBULIZER EVERY 6 HOURS AS NEEDED FOR WHEEZING FOR SHORTNESS OF BREATH . APPOINTMENT REQUIRED FOR FUTURE REFILLS 75 mL 0   albuterol  (VENTOLIN  HFA) 108 (90 Base) MCG/ACT inhaler INHALE 2 PUFFS BY MOUTH EVERY 6 HOURS AS NEEDED FOR WHEEZING FOR  SHORTNESS OF BREATH ** MUST HAVE OFFICE VISIT FOR REFILLS** 18 g 0   amoxicillin -clavulanate (AUGMENTIN ) 875-125 MG tablet Take 1 tablet by mouth every 12 (twelve) hours. 14 tablet 0   aspirin  EC 81 MG tablet Take 81 mg by mouth daily.     benzonatate  (TESSALON ) 200 MG capsule Take 1 capsule (200 mg total) by mouth every 8 (eight) hours as needed for cough. 30 capsule 0   fluticasone  furoate-vilanterol (BREO ELLIPTA ) 100-25 MCG/ACT AEPB Inhale 1 puff into the lungs daily. 1 each 11   gabapentin  (NEURONTIN ) 300 MG capsule Take 1 capsule (300 mg total) by mouth at bedtime. 90 capsule 1   glimepiride  (AMARYL ) 2 MG tablet Take 1 tablet (2 mg total) by mouth daily with breakfast. Please schedule PCP appointment. Missed 10/19/24 appt. 30 tablet 0   glucose blood (ACCU-CHEK GUIDE) test strip Use as instructed. Check blood glucose by fingerstick twice per day. 100 each 12   hydrocortisone  (ANUSOL -HC) 25 MG suppository Place 1 suppository (25 mg total) rectally 2 (two) times daily. 12 suppository 0   ketorolac  (TORADOL ) 10 MG tablet Take 1 tablet (10 mg total) by mouth every 6 (six) hours as needed (pain). 20 tablet 0   lisinopril  (ZESTRIL ) 20 MG tablet Take 1 tablet (20 mg total) by mouth daily. Please schedule PCP appointment for more refills. 90 tablet 0   loperamide  (IMODIUM ) 2 MG capsule Take 1 capsule (2 mg total) by mouth 4 (four) times daily as needed for diarrhea or loose stools. 12 capsule 0   nitroGLYCERIN  (NITROSTAT ) 0.4 MG SL tablet Place 1 tablet (0.4 mg total) under the tongue every 5 (five)  minutes as needed for chest pain. 25 tablet 5   pantoprazole  (PROTONIX ) 40 MG tablet Take 1 tablet (40 mg total) by mouth daily. 30 tablet 3   PARoxetine  (PAXIL ) 20 MG tablet Take 1 tablet (20 mg total) by mouth daily. 90 tablet 1   RYBELSUS  3 MG TABS Take 1 tablet by mouth once daily 30 tablet 0   simvastatin  (ZOCOR ) 40 MG tablet Take 1 tablet (40 mg total) by mouth daily. Please schedule PCP appointment for more refills. 90 tablet 0   sucralfate  (CARAFATE ) 1 g tablet Take 1 tablet (1 g total) by mouth 4 (four) times daily -  with meals and at bedtime. 40 tablet 0   triamcinolone  cream (KENALOG ) 0.1 % Apply 1 Application topically 2 (two) times daily. 30 g 0   valACYclovir  (VALTREX ) 1000 MG tablet TAKE 1 TABLET BY MOUTH THREE TIMES DAILY 270 tablet 0   No current facility-administered medications on file prior to visit.   "

## 2024-11-17 ENCOUNTER — Ambulatory Visit: Payer: Self-pay

## 2024-11-17 NOTE — Telephone Encounter (Signed)
 FYI Only or Action Required?: Action required by provider: request for appointment.  Patient was last seen in primary care on 11/02/2024 by Celestia Rosaline SQUIBB, NP.  Called Nurse Triage reporting Cough.  Symptoms began several weeks ago.  Interventions attempted: Nothing.  Symptoms are: unchanged.Has had cough for several weeks, continues. Has sweating. Asking to be worked in, states her insurance is changing and she needs assistance.  Triage Disposition: See Physician Within 24 Hours  Patient/caregiver understands and will follow disposition?: Yes      Reason for Disposition  [1] Continuous (nonstop) coughing interferes with work or school AND [2] no improvement using cough treatment per Care Advice  Protocols used: Cough - Acute Non-Productive-A-AH

## 2024-11-17 NOTE — Telephone Encounter (Signed)
" ° °  Patient/caregiver understands and will follow disposition?: Copied from CRM [id number]. Topic: Clinical - Red Word Triage >> Nov 17, 2024  9:37 AM Mesmerise C wrote: [facility] that prompted transfer to Nurse Triage: Patient has been coughing up a mucus, eyes watering, sweating, aching but hasn't been able to go to doctor's office due to waiting if the form has been sent to [employer] to be covered Answer Assessment - Initial Assessment Questions Patient said Im going to try to do that without crying Patient has several issues she is running between and talking fast. She says she has stopped going out because people look at her a lot when she coughs and sweats a lot. Patient has been having sweating going on says would come sit at our office and not bother anyone but she just wants people to see how bad sweating is. Says she gets to sweating a lot then coughing will start. Patient is saying Dr Tyna office has a paper for her insurance and she really needs it filled out so she can see her doctor. Seems like she can't be seen before this happens.   Patient put me on hold for a few minutes and never came back. Hung up and tried to call again. Left voice mail to call back office at [phone] so we could finish Triage. I did not get to ask patient any questions and had only gotten to hear what she had to say.  Protocols used: Cough - Acute Non-Productive-A-AH  "

## 2024-11-18 ENCOUNTER — Telehealth: Payer: Self-pay | Admitting: *Deleted

## 2024-11-18 NOTE — Telephone Encounter (Signed)
 Will forward. to PCP.   Not an [facility] patient. Copied from CRM [id number]. Topic: Clinical - Red Word Triage >> Nov 17, 2024  9:37 AM Mesmerise C wrote: [facility] that prompted transfer to Nurse Triage: Patient has been coughing up a mucus, eyes watering, sweating, aching but hasn't been able to go to doctor's office due to waiting if the form has been sent to [employer] to be covered >> Nov 18, 2024  1:29 PM Miquel SAILOR wrote: PT need call back on update for form faxed over from [facility] health. This is there 2nd attempt she stated  [phone]

## 2024-11-22 ENCOUNTER — Ambulatory Visit: Payer: Self-pay | Admitting: *Deleted

## 2024-11-22 ENCOUNTER — Ambulatory Visit

## 2024-11-22 VITALS — BP 110/67 | HR 81 | Temp 97.5°F | Resp 17 | Wt 192.4 lb

## 2024-11-22 DIAGNOSIS — J029 Acute pharyngitis, unspecified: Secondary | ICD-10-CM

## 2024-11-22 DIAGNOSIS — R051 Acute cough: Secondary | ICD-10-CM

## 2024-11-22 DIAGNOSIS — H9209 Otalgia, unspecified ear: Secondary | ICD-10-CM | POA: Diagnosis not present

## 2024-11-22 LAB — POCT RAPID STREP A (OFFICE): Rapid Strep A Screen: NEGATIVE

## 2024-11-22 MED ORDER — METHYLPREDNISOLONE 4 MG PO TBPK: ORAL_TABLET | ORAL | 0 refills | Status: AC

## 2024-11-22 NOTE — Telephone Encounter (Signed)
 Needs office visit. Also forms have been filled out for [employer]

## 2024-11-22 NOTE — Progress Notes (Unsigned)
" ° ° ° °  Patient ID: Melissa Pham, female    DOB: [birth year]  MRN: [id number]  CC: Medical Management of Chronic Issues (Patient said that she has congestion, nose running, throat hurt with red spots, N/V/D and eyes running for 1 week./Patient said that she thinks all this is a part of menopause. /)   Subjective: Melissa Pham is a 59 y.o. female with past medical history of *** who presents to clinic for 2 week history of sore throat, ear ache, body aches. Denies fever, chest pain or shortness of breath.    Allergies[1]  ROS: Review of Systems Negative except as stated above  PHYSICAL EXAM: BP 110/67   Pulse 81   Temp (!) 97.5 F (36.4 C) (Oral)   Resp 17   Wt 192 lb 6.4 oz (87.3 kg)   LMP 07/21/2013   SpO2 99%   BMI 36.35 kg/m   Physical Exam  General: well-appearing, no acute distress Skin: no jaundice, rashes, or lesions Cardiovascular: regular heart rate and rhythm, normal S1/S2, no murmurs, gallops, or rubs, peripheral pulses 2+ bilaterally Chest: no skeletal deformity, lungs clear to auscultation bilaterally, equal breath sounds bilaterally Abdomen: soft, non-distended, non-tender to palpation, no hepatomegaly, no splenomegaly, normoactive bowel sounds Musculoskeletal: normal gait Extremities: no peripheral edema  ASSESSMENT AND PLAN:  1. Sore throat (Primary)  Acute cough  Ear ache - POCT rapid strep A- Negative      Patient was given the opportunity to ask questions.  Patient verbalized understanding of the plan and was able to repeat key elements of the plan.    No orders of the defined types were placed in this encounter.    Requested Prescriptions    No prescriptions requested or ordered in this encounter    No follow-ups on file.  Sula Cower Fergus Throne, PA-C     [1]  Allergies Allergen Reactions   Sulfa Antibiotics Anaphylaxis   "

## 2024-11-22 NOTE — Telephone Encounter (Signed)
 Noted.

## 2024-11-22 NOTE — Telephone Encounter (Signed)
 FYI Only or Action Required?: FYI only for provider: appointment scheduled on 1/19.  Patient was last seen in primary care on 11/02/2024 by Celestia Rosaline SQUIBB, NP.  Called Nurse Triage reporting flu symptoms.  Symptoms began several weeks ago.  Interventions attempted: OTC medications: Tylenol , Mucinex  and Prescription medications: Zofran .  Symptoms are: gradually worsening.  Triage Disposition: See Physician Within 24 Hours  Patient/caregiver understands and will follow disposition?: Yes   Reason for Triage:  She came in on 11/02/25 and things are getting worse. Congestion, sore throat, body aches, runny nose. Over the counter medication is not working.    Reason for Disposition  Earache  Answer Assessment - Initial Assessment Questions 1. SYMPTOMS: What is your main symptom or concern? (e.g., cough, fever, shortness of breath, muscle aches)     Body aches, cough 2. ONSET: When did the symptoms start?      12/30 3. COUGH: Do you have a cough? If Yes, ask: How bad is the cough?       Yes- so strong it makes patient vomit 4. FEVER: Do you have a fever? If Yes, ask: What is your temperature, how was it measured, and when did it start?     Chills- no fever 5. BREATHING DIFFICULTY: Are you having any difficulty breathing? (e.g., normal; shortness of breath, wheezing, unable to speak)      No- nebulizer 6. BETTER-SAME-WORSE: Are you getting better, staying the same or getting worse compared to yesterday?  If getting worse, ask, In what way?     worse 7. OTHER SYMPTOMS: Do you have any other symptoms?  (e.g., chills, fatigue, headache, loss of smell or taste, muscle pain, sore throat)     Sore throat, ear pain, nasal discharge 8. INFLUENZA EXPOSURE: Was there any known exposure to influenza (flu) before the symptoms began?      Family- sister-COVID 9. INFLUENZA SUSPECTED: Why do you think you have influenza? (e.g., positive flu self-test at home, symptoms  after exposure).     symptoms 10. INFLUENZA VACCINE: Have you had the flu vaccine? If Yes, ask: When did you last get it?       Yes-2025 11. HIGH RISK FOR COMPLICATIONS: Do you have any chronic medical problems? (e.g., asthma, heart or lung disease, obesity, weak immune system)       CAD, hypertension  Protocols used: Influenza (Flu) Suspected-A-AH

## 2024-11-24 ENCOUNTER — Other Ambulatory Visit: Payer: Self-pay | Admitting: Family Medicine

## 2024-11-24 DIAGNOSIS — J4520 Mild intermittent asthma, uncomplicated: Secondary | ICD-10-CM

## 2024-11-26 ENCOUNTER — Other Ambulatory Visit: Payer: Self-pay | Admitting: Family Medicine

## 2024-11-26 DIAGNOSIS — E119 Type 2 diabetes mellitus without complications: Secondary | ICD-10-CM

## 2024-12-14 ENCOUNTER — Ambulatory Visit
